# Patient Record
Sex: Male | Born: 1985 | Race: White | Hispanic: No | Marital: Married | Smoking: Never smoker
Health system: Southern US, Community
[De-identification: ages and names within clinical notes are randomized; demographics above are authoritative.]

## PROBLEM LIST (undated history)

## (undated) DIAGNOSIS — F32A Depression, unspecified: Secondary | ICD-10-CM

## (undated) DIAGNOSIS — E039 Hypothyroidism, unspecified: Secondary | ICD-10-CM

## (undated) DIAGNOSIS — F419 Anxiety disorder, unspecified: Secondary | ICD-10-CM

## (undated) DIAGNOSIS — G43909 Migraine, unspecified, not intractable, without status migrainosus: Secondary | ICD-10-CM

## (undated) DIAGNOSIS — F329 Major depressive disorder, single episode, unspecified: Secondary | ICD-10-CM

## (undated) DIAGNOSIS — N189 Chronic kidney disease, unspecified: Secondary | ICD-10-CM

## (undated) HISTORY — DX: Anxiety disorder, unspecified: F41.9

## (undated) HISTORY — PX: ROTATOR CUFF REPAIR: SHX139

## (undated) HISTORY — PX: TONSILLECTOMY: SUR1361

## (undated) HISTORY — PX: TYMPANOSTOMY TUBE PLACEMENT: SHX32

---
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
## 2009-10-05 ENCOUNTER — Emergency Department (HOSPITAL_COMMUNITY): Admission: EM | Admit: 2009-10-05 | Discharge: 2009-10-05 | Payer: Self-pay | Admitting: Emergency Medicine

## 2010-03-25 LAB — URINE MICROSCOPIC-ADD ON

## 2010-03-25 LAB — URINALYSIS, ROUTINE W REFLEX MICROSCOPIC
Bilirubin Urine: NEGATIVE
Glucose, UA: NEGATIVE mg/dL
Ketones, ur: NEGATIVE mg/dL
Leukocytes, UA: NEGATIVE
Nitrite: NEGATIVE
Protein, ur: NEGATIVE mg/dL
Specific Gravity, Urine: >1.03 — ABNORMAL HIGH (ref 1.005–1.030)
Urobilinogen, UA: 0.2 mg/dL (ref 0.0–1.0)
pH: 5.5 (ref 5.0–8.0)

## 2010-05-28 NOTE — Op Note (Signed)
[place]  Patient:    Chase Stewart, Chase Stewart                        MRN: [id number] Proc. Date: 02/18/99 Attending:  Margit Banda. Jearld Fenton, M.D. CC:         Dr. Jodelle Green                           Operative Report  PREOPERATIVE DIAGNOSIS:  Chronic tonsillitis.  POSTOPERATIVE DIAGNOSIS:  Chronic tonsillitis.  SURGICAL PROCEDURE:  Tonsillectomy.  SURGEON:  Margit Banda. Jearld Fenton, M.D.  ANESTHESIA:  General endotracheal tube.  ESTIMATED BLOOD LOSS:  Less than 5 cc.  INDICATIONS:  This is an 25 year old who has had frequent episodes of tonsillitis and refractory to medical therapy.  Broad-spectrum antibiotics have failed to resolve the recurrent infections.  The parents are informed of the risks and benefits of the procedure including bleeding, infection, velopharyngeal insufficiency, change in the voice and risks of the anesthesia.  All questions ere answered and consent was obtained.  DESCRIPTION OF PROCEDURE:  Patient was taken to the operating room and placed in supine position.  After adequate general endotracheal tube anesthesia, was placed in the Rose position and draped in the usual sterile manner.  The Crowe-Davis mouthgag was inserted, retracted and suspended from the Mayo stand.  The palate was checked; there was no submucous cleft.  The left tonsil was begun, making a left anterior tonsillar pillar incision, identifying the capsule of the tonsil and removing it with electrocautery dissection; right tonsil removed in the same fashion.  There did not seem to be any significant adenoid tissue.  Hemostasis as achieved with the suction cautery.  The Crowe-Davis was released and resuspended and there was hemostasis present in all locations.  The hypopharynx, esophagus nd stomach were suctioned with the NG tube.  The patient was awakened and brought o recovery in stable condition.  Counts:  Correct. DD:  03/24/99 TD:  03/24/99 Job: [id number]

## 2012-08-26 ENCOUNTER — Emergency Department: Payer: Self-pay | Admitting: Emergency Medicine

## 2012-09-26 ENCOUNTER — Emergency Department: Payer: Self-pay | Admitting: Emergency Medicine

## 2013-06-27 ENCOUNTER — Other Ambulatory Visit: Payer: Self-pay | Admitting: Endodontics

## 2014-10-07 ENCOUNTER — Ambulatory Visit
Admission: RE | Admit: 2014-10-07 | Discharge: 2014-10-07 | Disposition: A | Discharge status: Home / Self Care | Payer: BLUE CROSS/BLUE SHIELD | Source: Ambulatory Visit | Attending: Physician Assistant | Admitting: Physician Assistant

## 2014-10-07 ENCOUNTER — Other Ambulatory Visit: Payer: Self-pay | Admitting: Physician Assistant

## 2014-10-07 DIAGNOSIS — M7542 Impingement syndrome of left shoulder: Secondary | ICD-10-CM

## 2014-11-26 ENCOUNTER — Other Ambulatory Visit: Payer: Self-pay | Admitting: Physician Assistant

## 2014-11-26 DIAGNOSIS — M7542 Impingement syndrome of left shoulder: Secondary | ICD-10-CM

## 2014-12-16 ENCOUNTER — Ambulatory Visit
Admission: RE | Admit: 2014-12-16 | Discharge: 2014-12-16 | Disposition: A | Discharge status: Home / Self Care | Payer: BLUE CROSS/BLUE SHIELD | Source: Ambulatory Visit | Attending: Physician Assistant | Admitting: Physician Assistant

## 2014-12-16 DIAGNOSIS — M67912 Unspecified disorder of synovium and tendon, left shoulder: Secondary | ICD-10-CM | POA: Diagnosis not present

## 2014-12-16 DIAGNOSIS — M25512 Pain in left shoulder: Secondary | ICD-10-CM | POA: Diagnosis present

## 2014-12-16 DIAGNOSIS — M19012 Primary osteoarthritis, left shoulder: Secondary | ICD-10-CM | POA: Insufficient documentation

## 2014-12-16 DIAGNOSIS — M7542 Impingement syndrome of left shoulder: Secondary | ICD-10-CM | POA: Insufficient documentation

## 2015-05-13 ENCOUNTER — Other Ambulatory Visit: Payer: Self-pay | Admitting: Orthopedic Surgery

## 2015-05-20 ENCOUNTER — Encounter: Payer: Self-pay | Admitting: *Deleted

## 2015-05-20 ENCOUNTER — Other Ambulatory Visit: Payer: BLUE CROSS/BLUE SHIELD

## 2015-05-20 NOTE — Patient Instructions (Signed)
  Your procedure is scheduled on: 05-26-15 (TUESDAY) Report to [facility] 2ND FLOOR To find out your arrival time please call [phone] between 1PM - 3PM on 05-25-15 [facility](MONDAY)  Remember: Instructions that are not followed completely may result in serious medical risk, up to and including death, or upon the discretion of your surgeon and anesthesiologist your surgery may need to be rescheduled.    _X___ 1. Do not eat food or drink liquids after midnight. No gum chewing or hard candies.     _X___ 2. No Alcohol for 24 hours before or after surgery.   ____ 3. Bring all medications with you on the day of surgery if instructed.    _X___ 4. Notify your doctor if there is any change in your medical condition     (cold, fever, infections).     Do not wear jewelry, make-up, hairpins, clips or nail polish.  Do not wear lotions, powders, or perfumes. You may wear deodorant.  Do not shave 48 hours prior to surgery. Men may shave face and neck.  Do not bring valuables to the hospital.    [facility] is not responsible for any belongings or valuables.               Contacts, dentures or bridgework may not be worn into surgery.  Leave your suitcase in the car. After surgery it may be brought to your room.  For patients admitted to the hospital, discharge time is determined by your treatment team.   Patients discharged the day of surgery will not be allowed to drive home.   Please read over the following fact sheets that you were given:     _X___ Take these medicines the morning of surgery with A SIP OF WATER:    1. ZOLOFT  2.   3.   4.  5.  6.  ____ Fleet Enema (as directed)   _X___ Use CHG Soap as directed  ____ Use inhalers on the day of surgery  ____ Stop metformin 2 days prior to surgery    ____ Take 1/2 of usual insulin dose the night before surgery and none on the morning of surgery.   ____ Stop Coumadin/Plavix/aspirin-N/A  _X___ Stop  Anti-inflammatories-NO NSAIDS OR ASPIRIN PRODUCTS-TYLENOL OK TO TAKE   ____ Stop supplements until after surgery.    ____ Bring C-Pap to the hospital.

## 2015-05-21 ENCOUNTER — Encounter
Admission: RE | Admit: 2015-05-21 | Discharge: 2015-05-21 | Disposition: A | Discharge status: Home / Self Care | Payer: BLUE CROSS/BLUE SHIELD | Source: Ambulatory Visit | Attending: Orthopedic Surgery | Admitting: Orthopedic Surgery

## 2015-05-21 DIAGNOSIS — Z01812 Encounter for preprocedural laboratory examination: Secondary | ICD-10-CM | POA: Diagnosis present

## 2015-05-21 LAB — CBC WITH DIFFERENTIAL/PLATELET
Basophils Absolute: 0 10*3/uL (ref 0–0.1)
Basophils Relative: 0 %
Eosinophils Absolute: 0 10*3/uL (ref 0–0.7)
Eosinophils Relative: 1 %
HCT: 44.2 % (ref 40.0–52.0)
Hemoglobin: 15.2 g/dL (ref 13.0–18.0)
Lymphocytes Relative: 29 %
Lymphs Abs: 2.3 10*3/uL (ref 1.0–3.6)
MCH: 31.5 pg (ref 26.0–34.0)
MCHC: 34.3 g/dL (ref 32.0–36.0)
MCV: 91.8 fL (ref 80.0–100.0)
Monocytes Absolute: 0.5 10*3/uL (ref 0.2–1.0)
Monocytes Relative: 7 %
Neutro Abs: 4.9 10*3/uL (ref 1.4–6.5)
Neutrophils Relative %: 63 %
Platelets: 173 10*3/uL (ref 150–440)
RBC: 4.81 MIL/uL (ref 4.40–5.90)
RDW: 12.2 % (ref 11.5–14.5)
WBC: 7.7 10*3/uL (ref 3.8–10.6)

## 2015-05-21 LAB — BASIC METABOLIC PANEL
Anion gap: 8 (ref 5–15)
BUN: 13 mg/dL (ref 6–20)
CO2: 28 mmol/L (ref 22–32)
Calcium: 9.4 mg/dL (ref 8.9–10.3)
Chloride: 103 mmol/L (ref 101–111)
Creatinine, Ser: 1.08 mg/dL (ref 0.61–1.24)
GFR calc Af Amer: >60 mL/min (ref >60)
GFR calc non Af Amer: >60 mL/min (ref >60)
Glucose, Bld: 66 mg/dL (ref 65–99)
Potassium: 4.1 mmol/L (ref 3.5–5.1)
Sodium: 139 mmol/L (ref 135–145)

## 2015-05-21 LAB — PROTIME-INR
INR: 1.04
Prothrombin Time: 13.8 seconds (ref 11.4–15.0)

## 2015-05-21 LAB — APTT: aPTT: 28 seconds (ref 24–36)

## 2015-05-26 ENCOUNTER — Ambulatory Visit
Admission: RE | Admit: 2015-05-26 | Discharge: 2015-05-26 | Disposition: A | Discharge status: Home / Self Care | Payer: BLUE CROSS/BLUE SHIELD | Source: Ambulatory Visit | Attending: Orthopedic Surgery | Admitting: Orthopedic Surgery

## 2015-05-26 ENCOUNTER — Encounter: Payer: Self-pay | Admitting: Anesthesiology

## 2015-05-26 ENCOUNTER — Ambulatory Visit: Payer: BLUE CROSS/BLUE SHIELD | Admitting: Anesthesiology

## 2015-05-26 ENCOUNTER — Encounter
Admission: RE | Disposition: A | Discharge status: Home / Self Care | Payer: Self-pay | Source: Ambulatory Visit | Attending: Orthopedic Surgery

## 2015-05-26 DIAGNOSIS — Y9389 Activity, other specified: Secondary | ICD-10-CM | POA: Diagnosis not present

## 2015-05-26 DIAGNOSIS — S43432A Superior glenoid labrum lesion of left shoulder, initial encounter: Secondary | ICD-10-CM | POA: Insufficient documentation

## 2015-05-26 DIAGNOSIS — F329 Major depressive disorder, single episode, unspecified: Secondary | ICD-10-CM | POA: Insufficient documentation

## 2015-05-26 DIAGNOSIS — X58XXXA Exposure to other specified factors, initial encounter: Secondary | ICD-10-CM | POA: Diagnosis not present

## 2015-05-26 DIAGNOSIS — S43422A Sprain of left rotator cuff capsule, initial encounter: Secondary | ICD-10-CM | POA: Diagnosis not present

## 2015-05-26 HISTORY — DX: Chronic kidney disease, unspecified: N18.9

## 2015-05-26 HISTORY — DX: Major depressive disorder, single episode, unspecified: F32.9

## 2015-05-26 HISTORY — DX: Depression, unspecified: F32.A

## 2015-05-26 HISTORY — PX: SHOULDER ARTHROSCOPY WITH OPEN ROTATOR CUFF REPAIR AND DISTAL CLAVICLE ACROMINECTOMY: SHX5683

## 2015-05-26 SURGERY — SHOULDER ARTHROSCOPY WITH OPEN ROTATOR CUFF REPAIR AND DISTAL CLAVICLE ACROMINECTOMY
Anesthesia: General | Site: Shoulder | Laterality: Left | Wound class: Clean

## 2015-05-26 MED ORDER — ROCURONIUM BROMIDE 100 MG/10ML IV SOLN
INTRAVENOUS | Status: DC | PRN
  Administered 2015-05-26: 40 mg via INTRAVENOUS
  Administered 2015-05-26 (×3): 10 mg via INTRAVENOUS
  Administered 2015-05-26: 5 mg via INTRAVENOUS
  Administered 2015-05-26: 10 mg via INTRAVENOUS

## 2015-05-26 MED ORDER — EPINEPHRINE HCL 1 MG/ML IJ SOLN: INTRAMUSCULAR | Status: DC | PRN

## 2015-05-26 MED ORDER — LIDOCAINE HCL (CARDIAC) 20 MG/ML IV SOLN
INTRAVENOUS | Status: DC | PRN
  Administered 2015-05-26: 20 mg via INTRAVENOUS

## 2015-05-26 MED ORDER — CEFAZOLIN SODIUM-DEXTROSE 2-4 GM/100ML-% IV SOLN: 2.0000 g | INTRAVENOUS | Status: DC

## 2015-05-26 MED ORDER — ONDANSETRON HCL 4 MG PO TABS: 4.0000 mg | ORAL_TABLET | Freq: Three times a day (TID) | ORAL | Status: DC | PRN

## 2015-05-26 MED ORDER — FAMOTIDINE 20 MG PO TABS
ORAL_TABLET | ORAL | Status: AC
  Administered 2015-05-26: 20 mg via ORAL
  Filled 2015-05-26: qty 1

## 2015-05-26 MED ORDER — FAMOTIDINE 20 MG PO TABS
20.0000 mg | ORAL_TABLET | Freq: Once | ORAL | Status: AC
  Administered 2015-05-26: 20 mg via ORAL

## 2015-05-26 MED ORDER — CHLORHEXIDINE GLUCONATE 4 % EX LIQD
1.0000 | Freq: Once | CUTANEOUS | Status: DC
Start: 2015-05-26 — End: 2015-05-26

## 2015-05-26 MED ORDER — NEOSTIGMINE METHYLSULFATE 10 MG/10ML IV SOLN
INTRAVENOUS | Status: DC | PRN
  Administered 2015-05-26: 4 mg via INTRAVENOUS

## 2015-05-26 MED ORDER — LACTATED RINGERS IV SOLN
INTRAVENOUS | Status: DC | PRN
  Administered 2015-05-26: 11:00:00

## 2015-05-26 MED ORDER — FENTANYL CITRATE (PF) 100 MCG/2ML IJ SOLN
INTRAMUSCULAR | Status: AC
  Administered 2015-05-26: 50 ug via INTRAVENOUS
  Filled 2015-05-26: qty 2

## 2015-05-26 MED ORDER — FENTANYL CITRATE (PF) 100 MCG/2ML IJ SOLN
INTRAMUSCULAR | Status: DC | PRN
  Administered 2015-05-26: 100 ug via INTRAVENOUS

## 2015-05-26 MED ORDER — OXYCODONE HCL 5 MG PO TABS: 5.0000 mg | ORAL_TABLET | ORAL | Status: DC | PRN

## 2015-05-26 MED ORDER — CHLORHEXIDINE GLUCONATE 4 % EX LIQD: 1.0000 "application " | Freq: Once | CUTANEOUS | Status: DC

## 2015-05-26 MED ORDER — LIDOCAINE HCL (PF) 1 % IJ SOLN
INTRAMUSCULAR | Status: AC
  Filled 2015-05-26: qty 30

## 2015-05-26 MED ORDER — MIDAZOLAM HCL 2 MG/2ML IJ SOLN
1.0000 mg | Freq: Once | INTRAMUSCULAR | Status: AC
  Administered 2015-05-26: 1 mg via INTRAVENOUS

## 2015-05-26 MED ORDER — ONDANSETRON HCL 4 MG/2ML IJ SOLN
INTRAMUSCULAR | Status: DC | PRN
  Administered 2015-05-26: 4 mg via INTRAVENOUS

## 2015-05-26 MED ORDER — NEOMYCIN-POLYMYXIN B GU 40-200000 IR SOLN
Status: DC | PRN
  Administered 2015-05-26: 2 mL

## 2015-05-26 MED ORDER — BUPIVACAINE HCL (PF) 0.25 % IJ SOLN
INTRAMUSCULAR | Status: AC
  Filled 2015-05-26: qty 30

## 2015-05-26 MED ORDER — MIDAZOLAM HCL 5 MG/5ML IJ SOLN
INTRAMUSCULAR | Status: AC
  Administered 2015-05-26: 1 mg via INTRAVENOUS
  Filled 2015-05-26: qty 5

## 2015-05-26 MED ORDER — CEFAZOLIN SODIUM-DEXTROSE 2-4 GM/100ML-% IV SOLN
INTRAVENOUS | Status: AC
  Administered 2015-05-26: 2 g via INTRAVENOUS
  Filled 2015-05-26: qty 100

## 2015-05-26 MED ORDER — PHENYLEPHRINE HCL 10 MG/ML IJ SOLN
INTRAMUSCULAR | Status: DC | PRN
  Administered 2015-05-26 (×8): 100 ug via INTRAVENOUS

## 2015-05-26 MED ORDER — GLYCOPYRROLATE 0.2 MG/ML IJ SOLN
INTRAMUSCULAR | Status: DC | PRN
  Administered 2015-05-26: 0.6 mg via INTRAVENOUS

## 2015-05-26 MED ORDER — OXYCODONE HCL 5 MG PO TABS
ORAL_TABLET | ORAL | Status: AC
  Filled 2015-05-26: qty 2

## 2015-05-26 MED ORDER — MIDAZOLAM HCL 2 MG/2ML IJ SOLN
INTRAMUSCULAR | Status: DC | PRN
  Administered 2015-05-26: 2 mg via INTRAVENOUS

## 2015-05-26 MED ORDER — NEOMYCIN-POLYMYXIN B GU 40-200000 IR SOLN
Status: AC
  Filled 2015-05-26: qty 2

## 2015-05-26 MED ORDER — FENTANYL CITRATE (PF) 100 MCG/2ML IJ SOLN
50.0000 ug | Freq: Once | INTRAMUSCULAR | Status: AC
  Administered 2015-05-26: 50 ug via INTRAVENOUS
  Filled 2015-05-26: qty 1

## 2015-05-26 MED ORDER — PROPOFOL 10 MG/ML IV BOLUS
INTRAVENOUS | Status: DC | PRN
  Administered 2015-05-26: 150 mg via INTRAVENOUS

## 2015-05-26 MED ORDER — EPINEPHRINE HCL 1 MG/ML IJ SOLN
INTRAMUSCULAR | Status: AC
  Filled 2015-05-26: qty 1

## 2015-05-26 MED ORDER — EPHEDRINE SULFATE 50 MG/ML IJ SOLN
INTRAMUSCULAR | Status: DC | PRN
  Administered 2015-05-26: 10 mg via INTRAVENOUS

## 2015-05-26 MED ORDER — OXYCODONE HCL 5 MG PO TABS
10.0000 mg | ORAL_TABLET | ORAL | Status: AC
  Administered 2015-05-26: 10 mg via ORAL

## 2015-05-26 MED ORDER — LACTATED RINGERS IV SOLN
INTRAVENOUS | Status: DC
  Administered 2015-05-26: 08:00:00 via INTRAVENOUS

## 2015-05-26 MED ORDER — BUPIVACAINE HCL 0.25 % IJ SOLN
INTRAMUSCULAR | Status: DC | PRN
  Administered 2015-05-26: 30 mL

## 2015-05-26 MED ORDER — LIDOCAINE HCL (PF) 1 % IJ SOLN
INTRAMUSCULAR | Status: DC | PRN
  Administered 2015-05-26: 10 mL

## 2015-05-26 SURGICAL SUPPLY — 66 items
ADAPTER IRRIG TUBE 2 SPIKE SOL (ADAPTER) ×4 IMPLANT
ADPR TBG 2 SPK PMP STRL ASCP (ADAPTER) ×2
ANCH SUT 2 BSUT TK 12.4 PEEK (SUTURE)
ANCH SUT 2 SUTTK 14.5X3 (SUTURE) ×3
ANCHOR SUT BIOC ST 3X145 (SUTURE) ×3 IMPLANT
ANCHOR SUT PEEK 3.0 ST 3 (SUTURE) ×1 IMPLANT
BUR RADIUS 4.0X18.5 (BURR) ×2 IMPLANT
BUR RADIUS 5.5 (BURR) ×2 IMPLANT
CANNULA 5.75X7 CRYSTAL CLEAR (CANNULA) ×3 IMPLANT
CANNULA PARTIAL THREAD 2X7 (CANNULA) ×3 IMPLANT
CANNULA TWIST IN 8.25X9CM (CANNULA) ×2 IMPLANT
CONNECTOR PERFECT PASSER (CONNECTOR) ×4 IMPLANT
COOLER POLAR GLACIER W/PUMP (MISCELLANEOUS) ×2 IMPLANT
CRADLE LAMINECT ARM (MISCELLANEOUS) ×4 IMPLANT
DRAPE IMP U-DRAPE 54X76 (DRAPES) ×4 IMPLANT
DRAPE INCISE IOBAN 66X45 STRL (DRAPES) ×2 IMPLANT
DRAPE SHEET LG 3/4 BI-LAMINATE (DRAPES) ×2 IMPLANT
DRAPE U-SHAPE 47X51 STRL (DRAPES) ×2 IMPLANT
DURAPREP 26ML APPLICATOR (WOUND CARE) ×7 IMPLANT
ELECT REM PT RETURN 9FT ADLT (ELECTROSURGICAL) ×2
ELECTRODE REM PT RTRN 9FT ADLT (ELECTROSURGICAL) ×1 IMPLANT
GAUZE PETRO XEROFOAM 1X8 (MISCELLANEOUS) ×2 IMPLANT
GAUZE SPONGE 4X4 12PLY STRL (GAUZE/BANDAGES/DRESSINGS) ×2 IMPLANT
GLOVE BIOGEL PI IND STRL 9 (GLOVE) ×1 IMPLANT
GLOVE BIOGEL PI INDICATOR 9 (GLOVE) ×2
GLOVE SURG 9.0 ORTHO LTXF (GLOVE) ×7 IMPLANT
GOWN STRL REUS TWL 2XL XL LVL4 (GOWN DISPOSABLE) ×2 IMPLANT
GOWN STRL REUS W/ TWL LRG LVL3 (GOWN DISPOSABLE) ×1 IMPLANT
GOWN STRL REUS W/TWL LRG LVL3 (GOWN DISPOSABLE) ×2
IV LACTATED RINGER IRRG 3000ML (IV SOLUTION) ×16
IV LR IRRIG 3000ML ARTHROMATIC (IV SOLUTION) ×8 IMPLANT
KIT RM TURNOVER STRD PROC AR (KITS) ×2 IMPLANT
KIT STABILIZATION SHOULDER (MISCELLANEOUS) ×2 IMPLANT
KIT SUTURETAK 3.0 INSERT PERC (KITS) ×1 IMPLANT
MANIFOLD NEPTUNE II (INSTRUMENTS) ×2 IMPLANT
MASK FACE SPIDER DISP (MASK) ×2 IMPLANT
MAT BLUE FLOOR 46X72 FLO (MISCELLANEOUS) ×5 IMPLANT
NDL SAFETY 18GX1.5 (NEEDLE) ×2 IMPLANT
NDL SAFETY 22GX1.5 (NEEDLE) ×2 IMPLANT
NS IRRIG 500ML POUR BTL (IV SOLUTION) ×2 IMPLANT
PACK ARTHROSCOPY SHOULDER (MISCELLANEOUS) ×2 IMPLANT
PAD WRAPON POLAR SHDR UNIV (MISCELLANEOUS) ×1 IMPLANT
PASSER SUT CAPTURE FIRST (SUTURE) ×1 IMPLANT
SET TUBE SUCT SHAVER OUTFL 24K (TUBING) ×2 IMPLANT
SET TUBE TIP INTRA-ARTICULAR (MISCELLANEOUS) ×2 IMPLANT
STRIP CLOSURE SKIN 1/2X4 (GAUZE/BANDAGES/DRESSINGS) ×2 IMPLANT
SUT ETHILON 4-0 (SUTURE)
SUT ETHILON 4-0 FS2 18XMFL BLK (SUTURE)
SUT LASSO 90 DEG SD STR (SUTURE) ×1 IMPLANT
SUT MNCRL 4-0 (SUTURE)
SUT MNCRL 4-0 27XMFL (SUTURE)
SUT PDS AB 0 CT1 27 (SUTURE) ×3 IMPLANT
SUT PERFECTPASSER WHITE CART (SUTURE) ×4 IMPLANT
SUT SMART STITCH CARTRIDGE (SUTURE) ×4 IMPLANT
SUT VIC AB 0 CT1 36 (SUTURE) ×3 IMPLANT
SUT VIC AB 2-0 CT2 27 (SUTURE) ×1 IMPLANT
SUTURE ETHLN 4-0 FS2 18XMF BLK (SUTURE) ×1 IMPLANT
SUTURE MAGNUM WIRE 2X48 BLK (SUTURE) ×4 IMPLANT
SUTURE MNCRL 4-0 27XMF (SUTURE) ×1 IMPLANT
SYRINGE 10CC LL (SYRINGE) ×2 IMPLANT
Suture Anchor, Biocomposite (Anchor) ×4 IMPLANT
TAPE MICROFOAM 4IN (TAPE) ×2 IMPLANT
TUBING ARTHRO INFLOW-ONLY STRL (TUBING) ×2 IMPLANT
TUBING CONNECTING 10 (TUBING) ×1 IMPLANT
WAND HAND CNTRL MULTIVAC 90 (MISCELLANEOUS) ×2 IMPLANT
WRAPON POLAR PAD SHDR UNIV (MISCELLANEOUS) ×2

## 2015-05-26 NOTE — Anesthesia Postprocedure Evaluation (Signed)
Anesthesia Post Note  Patient: Chase Stewart  Procedure(s) Performed: Procedure(s) (LRB): SHOULDER ARTHROSCOPY WITH ANTERIOR AND SUPERIOR LABRAL REPAIR (Left)  Patient location during evaluation: PACU Anesthesia Type: General Level of consciousness: awake and alert Pain management: pain level controlled Vital Signs Assessment: post-procedure vital signs reviewed and stable Respiratory status: spontaneous breathing, nonlabored ventilation, respiratory function stable and patient connected to nasal cannula oxygen Cardiovascular status: blood pressure returned to baseline and stable Postop Assessment: no signs of nausea or vomiting Anesthetic complications: no    Last Vitals:  Filed Vitals:   05/26/15 1324 05/26/15 1351  BP: 118/64   Pulse:  67  Temp:    Resp:  16    Last Pain:  Filed Vitals:   05/26/15 1352  PainSc: 0-No pain                 Daquisha Clermont S

## 2015-05-26 NOTE — H&P (Signed)
The patient has been re-examined, and the chart reviewed, and there have been no interval changes to the documented history and physical.    The risks, benefits, and alternatives have been discussed at length, and the patient is willing to proceed.   

## 2015-05-26 NOTE — Anesthesia Preprocedure Evaluation (Signed)
Anesthesia Evaluation  Patient identified by MRN, date of birth, ID band Patient awake    Reviewed: Allergy & Precautions, NPO status , Patient's Chart, lab work & pertinent test results, reviewed documented beta blocker date and time   Airway Mallampati: II  TM Distance: >3 FB     Dental  (+) Chipped   Pulmonary           Cardiovascular      Neuro/Psych PSYCHIATRIC DISORDERS Depression    GI/Hepatic   Endo/Other    Renal/GU Renal InsufficiencyRenal disease     Musculoskeletal   Abdominal   Peds  Hematology   Anesthesia Other Findings   Reproductive/Obstetrics                             Anesthesia Physical Anesthesia Plan  ASA: II  Anesthesia Plan: General   Post-op Pain Management:    Induction: Intravenous  Airway Management Planned: Oral ETT  Additional Equipment:   Intra-op Plan:   Post-operative Plan:   Informed Consent: I have reviewed the patients History and Physical, chart, labs and discussed the procedure including the risks, benefits and alternatives for the proposed anesthesia with the patient or authorized representative who has indicated his/her understanding and acceptance.     Plan Discussed with: CRNA  Anesthesia Plan Comments:         Anesthesia Quick Evaluation

## 2015-05-26 NOTE — Anesthesia Procedure Notes (Addendum)
Anesthesia Regional Block:  Interscalene brachial plexus block  Pre-Anesthetic Checklist: ,, timeout performed, Correct Patient, Correct Site, Correct Laterality, Correct Procedure, Correct Position, site marked, Risks and benefits discussed,  Surgical consent,  Pre-op evaluation,  At surgeon's request and post-op pain management   Prep: Betadine       Needles:  Injection technique: Single-shot  Needle Type: Echogenic Stimulator Needle     Needle Length: 5cm 5 cm Needle Gauge: 21 and 21 G    Additional Needles:  Procedures: ultrasound guided (picture in chart) and nerve stimulator Interscalene brachial plexus block  Nerve Stimulator or Paresthesia:  Response: biceps flexion, 0.8 mA,   Additional Responses:   Narrative:  Injection made incrementally with aspirations every 5 mL.  Performed by: Personally  Anesthesiologist: Berdine AddisonHOMAS, MATHAI  Additional Notes: Functioning IV was confirmed and monitors were applied.  A 50mm 22ga Arrow echogenic stimulator needle was used. Sterile prep and drape,hand hygiene and sterile gloves were used.  Negative aspiration and negative test dose prior to incremental administration of local anesthetic. The patient tolerated the procedure well.  Ultrasound guidance: relevent anatomy identified, needle position confirmed, local anesthetic spread visualized around nerve(s), vascular puncture avoided.  Image printed for medical record. Marcaine 0.25% 20 ml with 0.1mg  of epi.   Procedure Name: Intubation Performed by: Casey BurkittHOANG, Westlee Devita Pre-anesthesia Checklist: Patient identified, Emergency Drugs available, Suction available, Patient being monitored and Timeout performed Patient Re-evaluated:Patient Re-evaluated prior to inductionOxygen Delivery Method: Circle system utilized Preoxygenation: Pre-oxygenation with 100% oxygen Intubation Type: IV induction Ventilation: Mask ventilation without difficulty Laryngoscope Size: Mac and 3 Grade View: Grade  II Tube type: Oral Tube size: 7.5 mm Number of attempts: 1 Airway Equipment and Method: Stylet Placement Confirmation: ETT inserted through vocal cords under direct vision,  positive ETCO2 and breath sounds checked- equal and bilateral Secured at: 23 cm Tube secured with: Tape Dental Injury: Teeth and Oropharynx as per pre-operative assessment  Difficulty Due To: Difficult Airway- due to anterior larynx

## 2015-05-26 NOTE — Progress Notes (Signed)
C/o feeling some nausea, Dr. Maisie Fushomas notified and ordered phenergan. Pharmacy stated that phenergan was on back order and to try compazine. Dr. Maisie Fushomas notified of this and then the patient stated that he was feeling better and didn't want any medication for nausea.

## 2015-05-26 NOTE — Discharge Instructions (Signed)

## 2015-05-26 NOTE — Op Note (Signed)
05/26/2015  11:48 AM  PATIENT:  Chase Stewart  30 y.o. male  PRE-OPERATIVE DIAGNOSIS:  Left shoulder superior labral tear  POST-OPERATIVE DIAGNOSIS:  Left shoulder superior and posterior labral repair with anterior capsulorrhaphy  PROCEDURE:  Procedure(s): LEFT SHOULDER ARTHROSCOPY WITH SLAP  REPAIR AND ANTERIOR CAPSULORRHAPHY  SURGEON:  Surgeon(s) and Role:    * Thornton Park, MD - Primary  ANESTHESIA:   local, regional and general   PREOPERATIVE INDICATIONS:  Chase Stewart is a  30 y.o. male results are with persistent left shoulder pain who failed conservative treatment including physical therapy. A superior labral tear was diagnosed by MR arthrogram  given the patient's failure to respond to nonoperative treatment, he elected for surgical management.    I discussed the risks and benefits of arthroscopic labral repair surgery. The risks include but are not limited to infection, bleeding, nerve or blood vessel injury, joint stiffness or loss of motion, persistent pain, weakness or instability, and hardware failure and the need for further surgery. Medical risks include but are not limited to DVT and pulmonary embolism, myocardial infarction, stroke, pneumonia, respiratory failure and death. Patient understood these risks and wished to proceed.    OPERATIVE IMPLANTS: Arthrex bio suturetak anchors 4   OPERATIVE FINDINGS: Left shoulder Buford complex with large SLAP tear and anterior shoulder instability.    OPERATIVE PROCEDURE:  I met with the patient in the preoperative area.  I signed the left shoulder according the hospital's correct site of surgery protocol. Patient underwent an interscalene block in the preoperative area by the anesthesia service. Patient was brought to the operating room where he was positioned supine on the operative table. He underwent general endotracheal intubation. He was then positioned in a beachchair position. All bony prominences were adequately  padded including the lower extremities.  Examination under anesthesia revealed anterior shoulder instability with subluxation anteriorly with load and shift testing.  He had a negative sulcus sign. Patient had no limitation shoulder range of motion.   The patient was then prepped and draped in a sterile fashion. He received 2 g of Ancef prior to the onset of the case.  A timeout was performed to verify the patient's name, date of birth, medical record number, correct site of surgery and correct procedure to be performed. Was also used to verify the patient received antibiotics that all appropriate instruments, implants and radiographic studies were available in the room. Once all in attendance were in agreement case began.  Bony landmarks were drawn out with a surgical marker along with proposed incisions. These were pre-injected with 1% lidocaine plain. An 11 blade was used to establish a posterior portal through which the arthroscope was placed in the glenohumeral joint. An anterior portal was established under direct visualization using an 18-gauge spinal needle for localization. A 5.75 mm arthroscopic cannula was inserted through the anterior portal. A full diagnostic examination of the glenohumeral joint was performed.  Findings on arthroscopy included a Buford complex, fraying of the biceps sling, a large SLAP tear extending from anterior to posterior to approximately the 10:00 position. The humeral head could be subluxed manually anteriorly of approximately 3-4 quadrants. Patient was found to have an enlarged superior labrum  An anterolateral portal was established again under direct visualization using an 18-gauge spinal needle. A 7 mm cannula was placed through this anterolateral portal.    A 4.0 resector shaver blade was then used to debride the frayed edges of the biceps sling. There were adhesions to the  anterior superior labrum which were also debrided with the resector.  The superior labrum  was debrided with left 90 basket and 40 resector shaver blade to reduce its size. The superior, non-articular portion of the glenoid was debrided with a 4.20m resector shaver blade in preparation for repair.  Punctate bleeding was identified which will facilitate labral healing. An Arthrex bio suturetak anchor was then placed in the anterior superior glenoid just anterior to the biceps anchor. A single limb of this anchor was brought under the labrum. An arthroscopic knot tying technique was used to tie down the anterior superior labrum.  A percutaneous drill guide was then inserted laterally using the Arthrex percutaneous kit. An additional superior anchor was placed just posterior to the biceps anchor. A single limb was shuttled under the superior labrum using a 90 suture lasso. This was also tied down using an arthroscopic knot tying technique.  The arthroscope was then placed through the anterior portal using switching sticks. A 7.0 mm cannula was placed through the posterior portal. Using the percutaneous drill guide a second superior posterior anchor was placed. Again the 90 suture lasso was used to pass a single limb of this anchor under the labrum from the posterior portal. The sutures from this anchor were then tied down using arthroscopic knot tying technique. A hook probe was used to confirm stability of the superior labral repair.  Switching sticks were again used to reposition the arthroscope to the posterior portal. Patient still had the ability to sublux thehumeral head approximately 2 quadrants. No anterior labral tear was identified. The decision was therefore made to perform an anterior capsulorrhaphy. A fourth bio suture tack anchor was placed in the anterior labrum at approximately the 7:00 position. Again the single limb of this anchor was passed below the labrum and capsule and tied down as an arthroscopic knot tying technique. This provided a soft tissue bumper and the anterior  inferior labrum.   Arthrex suture lasso was then used to shuttle a single limb of this anchor through the anterior inferior capsule.  Arthroscopic knot tying technique was then used to complete the capsulorrhaphy anteriorly. Following capsulorrhaphy the humeral head was stable. Final arthroscopic images were taken.  The glenohumeral joint was then copiously irrigated.  All arthroscopic instruments were removed. The 4 arthroscopic portals were closed with 4-0 nylon. A dry, sterile dressing was applied to the right shoulder, along with TENS unit leads and a Polar Care sleeve. Patient's right arm was then placed in an abduction sling. He was awoken and brought to PACU in stable condition. I was scrubbed and present for the entire case and all sharp and instrument counts were correct at the conclusion the case. I spoke with the patient's girlfriend in the postop consultation room to let her know the operation was successful and performed without complication.    KTimoteo Gaul MD

## 2015-05-26 NOTE — Transfer of Care (Signed)
Immediate Anesthesia Transfer of Care Note  Patient: Chase Stewart  Procedure(s) Performed: Procedure(s): SHOULDER ARTHROSCOPY WITH ANTERIOR AND SUPERIOR LABRAL REPAIR (Left)  Patient Location: PACU  Anesthesia Type:General  Level of Consciousness: responds to stimulation  Airway & Oxygen Therapy: Patient Spontanous Breathing and Patient connected to face mask oxygen  Post-op Assessment: Report given to RN and Post -op Vital signs reviewed and stable  Post vital signs: Reviewed and stable  Last Vitals:  Filed Vitals:   05/26/15 1148 05/26/15 1149  BP: 122/50 122/50  Pulse: 50 49  Temp: 36.1 C   Resp: 16 15    Last Pain: There were no vitals filed for this visit.       Complications: No apparent anesthesia complications

## 2015-05-27 ENCOUNTER — Encounter: Payer: Self-pay | Admitting: Orthopedic Surgery

## 2015-08-28 DIAGNOSIS — M25519 Pain in unspecified shoulder: Secondary | ICD-10-CM | POA: Insufficient documentation

## 2015-12-28 DIAGNOSIS — Z8619 Personal history of other infectious and parasitic diseases: Secondary | ICD-10-CM | POA: Insufficient documentation

## 2018-07-11 DIAGNOSIS — R51 Headache: Secondary | ICD-10-CM | POA: Diagnosis not present

## 2018-07-11 DIAGNOSIS — M9901 Segmental and somatic dysfunction of cervical region: Secondary | ICD-10-CM | POA: Diagnosis not present

## 2018-07-11 DIAGNOSIS — M5412 Radiculopathy, cervical region: Secondary | ICD-10-CM | POA: Diagnosis not present

## 2018-07-11 DIAGNOSIS — M9902 Segmental and somatic dysfunction of thoracic region: Secondary | ICD-10-CM | POA: Diagnosis not present

## 2018-07-12 DIAGNOSIS — R51 Headache: Secondary | ICD-10-CM | POA: Diagnosis not present

## 2018-07-12 DIAGNOSIS — M9901 Segmental and somatic dysfunction of cervical region: Secondary | ICD-10-CM | POA: Diagnosis not present

## 2018-07-12 DIAGNOSIS — M9902 Segmental and somatic dysfunction of thoracic region: Secondary | ICD-10-CM | POA: Diagnosis not present

## 2018-07-12 DIAGNOSIS — M5412 Radiculopathy, cervical region: Secondary | ICD-10-CM | POA: Diagnosis not present

## 2018-07-16 DIAGNOSIS — M9902 Segmental and somatic dysfunction of thoracic region: Secondary | ICD-10-CM | POA: Diagnosis not present

## 2018-07-16 DIAGNOSIS — R51 Headache: Secondary | ICD-10-CM | POA: Diagnosis not present

## 2018-07-16 DIAGNOSIS — M9901 Segmental and somatic dysfunction of cervical region: Secondary | ICD-10-CM | POA: Diagnosis not present

## 2018-07-16 DIAGNOSIS — M5412 Radiculopathy, cervical region: Secondary | ICD-10-CM | POA: Diagnosis not present

## 2018-07-18 DIAGNOSIS — M9902 Segmental and somatic dysfunction of thoracic region: Secondary | ICD-10-CM | POA: Diagnosis not present

## 2018-07-18 DIAGNOSIS — M5412 Radiculopathy, cervical region: Secondary | ICD-10-CM | POA: Diagnosis not present

## 2018-07-18 DIAGNOSIS — M9901 Segmental and somatic dysfunction of cervical region: Secondary | ICD-10-CM | POA: Diagnosis not present

## 2018-07-18 DIAGNOSIS — R51 Headache: Secondary | ICD-10-CM | POA: Diagnosis not present

## 2018-07-19 DIAGNOSIS — R51 Headache: Secondary | ICD-10-CM | POA: Diagnosis not present

## 2018-07-19 DIAGNOSIS — M5412 Radiculopathy, cervical region: Secondary | ICD-10-CM | POA: Diagnosis not present

## 2018-07-19 DIAGNOSIS — M9902 Segmental and somatic dysfunction of thoracic region: Secondary | ICD-10-CM | POA: Diagnosis not present

## 2018-07-19 DIAGNOSIS — M9901 Segmental and somatic dysfunction of cervical region: Secondary | ICD-10-CM | POA: Diagnosis not present

## 2018-07-23 DIAGNOSIS — M9902 Segmental and somatic dysfunction of thoracic region: Secondary | ICD-10-CM | POA: Diagnosis not present

## 2018-07-23 DIAGNOSIS — M9901 Segmental and somatic dysfunction of cervical region: Secondary | ICD-10-CM | POA: Diagnosis not present

## 2018-07-23 DIAGNOSIS — M5412 Radiculopathy, cervical region: Secondary | ICD-10-CM | POA: Diagnosis not present

## 2018-07-23 DIAGNOSIS — R51 Headache: Secondary | ICD-10-CM | POA: Diagnosis not present

## 2018-08-01 DIAGNOSIS — M9902 Segmental and somatic dysfunction of thoracic region: Secondary | ICD-10-CM | POA: Diagnosis not present

## 2018-08-01 DIAGNOSIS — M5412 Radiculopathy, cervical region: Secondary | ICD-10-CM | POA: Diagnosis not present

## 2018-08-01 DIAGNOSIS — R51 Headache: Secondary | ICD-10-CM | POA: Diagnosis not present

## 2018-08-01 DIAGNOSIS — M9901 Segmental and somatic dysfunction of cervical region: Secondary | ICD-10-CM | POA: Diagnosis not present

## 2018-08-02 DIAGNOSIS — R51 Headache: Secondary | ICD-10-CM | POA: Diagnosis not present

## 2018-08-02 DIAGNOSIS — M9901 Segmental and somatic dysfunction of cervical region: Secondary | ICD-10-CM | POA: Diagnosis not present

## 2018-08-02 DIAGNOSIS — M5412 Radiculopathy, cervical region: Secondary | ICD-10-CM | POA: Diagnosis not present

## 2018-08-02 DIAGNOSIS — M9902 Segmental and somatic dysfunction of thoracic region: Secondary | ICD-10-CM | POA: Diagnosis not present

## 2018-08-04 DIAGNOSIS — M5412 Radiculopathy, cervical region: Secondary | ICD-10-CM | POA: Diagnosis not present

## 2018-08-04 DIAGNOSIS — M9901 Segmental and somatic dysfunction of cervical region: Secondary | ICD-10-CM | POA: Diagnosis not present

## 2018-08-04 DIAGNOSIS — M9902 Segmental and somatic dysfunction of thoracic region: Secondary | ICD-10-CM | POA: Diagnosis not present

## 2018-08-04 DIAGNOSIS — R51 Headache: Secondary | ICD-10-CM | POA: Diagnosis not present

## 2018-08-07 DIAGNOSIS — M5412 Radiculopathy, cervical region: Secondary | ICD-10-CM | POA: Diagnosis not present

## 2018-08-07 DIAGNOSIS — M9901 Segmental and somatic dysfunction of cervical region: Secondary | ICD-10-CM | POA: Diagnosis not present

## 2018-08-07 DIAGNOSIS — R51 Headache: Secondary | ICD-10-CM | POA: Diagnosis not present

## 2018-08-07 DIAGNOSIS — M9902 Segmental and somatic dysfunction of thoracic region: Secondary | ICD-10-CM | POA: Diagnosis not present

## 2018-08-09 DIAGNOSIS — M9901 Segmental and somatic dysfunction of cervical region: Secondary | ICD-10-CM | POA: Diagnosis not present

## 2018-08-09 DIAGNOSIS — M5412 Radiculopathy, cervical region: Secondary | ICD-10-CM | POA: Diagnosis not present

## 2018-08-09 DIAGNOSIS — R51 Headache: Secondary | ICD-10-CM | POA: Diagnosis not present

## 2018-08-09 DIAGNOSIS — M9902 Segmental and somatic dysfunction of thoracic region: Secondary | ICD-10-CM | POA: Diagnosis not present

## 2018-08-14 DIAGNOSIS — R51 Headache: Secondary | ICD-10-CM | POA: Diagnosis not present

## 2018-08-14 DIAGNOSIS — M5412 Radiculopathy, cervical region: Secondary | ICD-10-CM | POA: Diagnosis not present

## 2018-08-14 DIAGNOSIS — M9901 Segmental and somatic dysfunction of cervical region: Secondary | ICD-10-CM | POA: Diagnosis not present

## 2018-08-14 DIAGNOSIS — M9902 Segmental and somatic dysfunction of thoracic region: Secondary | ICD-10-CM | POA: Diagnosis not present

## 2018-08-16 DIAGNOSIS — M5412 Radiculopathy, cervical region: Secondary | ICD-10-CM | POA: Diagnosis not present

## 2018-08-16 DIAGNOSIS — M9902 Segmental and somatic dysfunction of thoracic region: Secondary | ICD-10-CM | POA: Diagnosis not present

## 2018-08-16 DIAGNOSIS — M9901 Segmental and somatic dysfunction of cervical region: Secondary | ICD-10-CM | POA: Diagnosis not present

## 2018-08-16 DIAGNOSIS — R51 Headache: Secondary | ICD-10-CM | POA: Diagnosis not present

## 2018-08-18 DIAGNOSIS — M5412 Radiculopathy, cervical region: Secondary | ICD-10-CM | POA: Diagnosis not present

## 2018-08-18 DIAGNOSIS — R51 Headache: Secondary | ICD-10-CM | POA: Diagnosis not present

## 2018-08-18 DIAGNOSIS — M9902 Segmental and somatic dysfunction of thoracic region: Secondary | ICD-10-CM | POA: Diagnosis not present

## 2018-08-18 DIAGNOSIS — M9901 Segmental and somatic dysfunction of cervical region: Secondary | ICD-10-CM | POA: Diagnosis not present

## 2018-08-28 DIAGNOSIS — M5412 Radiculopathy, cervical region: Secondary | ICD-10-CM | POA: Diagnosis not present

## 2018-08-28 DIAGNOSIS — M9901 Segmental and somatic dysfunction of cervical region: Secondary | ICD-10-CM | POA: Diagnosis not present

## 2018-08-28 DIAGNOSIS — R51 Headache: Secondary | ICD-10-CM | POA: Diagnosis not present

## 2018-08-28 DIAGNOSIS — M9902 Segmental and somatic dysfunction of thoracic region: Secondary | ICD-10-CM | POA: Diagnosis not present

## 2018-08-30 DIAGNOSIS — M5412 Radiculopathy, cervical region: Secondary | ICD-10-CM | POA: Diagnosis not present

## 2018-08-30 DIAGNOSIS — R51 Headache: Secondary | ICD-10-CM | POA: Diagnosis not present

## 2018-08-30 DIAGNOSIS — M9901 Segmental and somatic dysfunction of cervical region: Secondary | ICD-10-CM | POA: Diagnosis not present

## 2018-08-30 DIAGNOSIS — M9902 Segmental and somatic dysfunction of thoracic region: Secondary | ICD-10-CM | POA: Diagnosis not present

## 2018-09-04 DIAGNOSIS — M9901 Segmental and somatic dysfunction of cervical region: Secondary | ICD-10-CM | POA: Diagnosis not present

## 2018-09-04 DIAGNOSIS — M9902 Segmental and somatic dysfunction of thoracic region: Secondary | ICD-10-CM | POA: Diagnosis not present

## 2018-09-04 DIAGNOSIS — M5412 Radiculopathy, cervical region: Secondary | ICD-10-CM | POA: Diagnosis not present

## 2018-09-04 DIAGNOSIS — R51 Headache: Secondary | ICD-10-CM | POA: Diagnosis not present

## 2018-09-06 DIAGNOSIS — M9901 Segmental and somatic dysfunction of cervical region: Secondary | ICD-10-CM | POA: Diagnosis not present

## 2018-09-06 DIAGNOSIS — M5412 Radiculopathy, cervical region: Secondary | ICD-10-CM | POA: Diagnosis not present

## 2018-09-06 DIAGNOSIS — R51 Headache: Secondary | ICD-10-CM | POA: Diagnosis not present

## 2018-09-06 DIAGNOSIS — M9902 Segmental and somatic dysfunction of thoracic region: Secondary | ICD-10-CM | POA: Diagnosis not present

## 2018-09-21 DIAGNOSIS — M9901 Segmental and somatic dysfunction of cervical region: Secondary | ICD-10-CM | POA: Diagnosis not present

## 2018-09-21 DIAGNOSIS — M5412 Radiculopathy, cervical region: Secondary | ICD-10-CM | POA: Diagnosis not present

## 2018-09-21 DIAGNOSIS — M9902 Segmental and somatic dysfunction of thoracic region: Secondary | ICD-10-CM | POA: Diagnosis not present

## 2018-09-21 DIAGNOSIS — R51 Headache: Secondary | ICD-10-CM | POA: Diagnosis not present

## 2018-10-12 DIAGNOSIS — M9901 Segmental and somatic dysfunction of cervical region: Secondary | ICD-10-CM | POA: Diagnosis not present

## 2018-10-12 DIAGNOSIS — M9902 Segmental and somatic dysfunction of thoracic region: Secondary | ICD-10-CM | POA: Diagnosis not present

## 2018-10-12 DIAGNOSIS — M5412 Radiculopathy, cervical region: Secondary | ICD-10-CM | POA: Diagnosis not present

## 2018-10-12 DIAGNOSIS — R519 Headache, unspecified: Secondary | ICD-10-CM | POA: Diagnosis not present

## 2018-10-16 DIAGNOSIS — M5412 Radiculopathy, cervical region: Secondary | ICD-10-CM | POA: Diagnosis not present

## 2018-10-16 DIAGNOSIS — M9902 Segmental and somatic dysfunction of thoracic region: Secondary | ICD-10-CM | POA: Diagnosis not present

## 2018-10-16 DIAGNOSIS — M9901 Segmental and somatic dysfunction of cervical region: Secondary | ICD-10-CM | POA: Diagnosis not present

## 2018-10-16 DIAGNOSIS — R519 Headache, unspecified: Secondary | ICD-10-CM | POA: Diagnosis not present

## 2018-10-23 DIAGNOSIS — M9902 Segmental and somatic dysfunction of thoracic region: Secondary | ICD-10-CM | POA: Diagnosis not present

## 2018-10-23 DIAGNOSIS — R519 Headache, unspecified: Secondary | ICD-10-CM | POA: Diagnosis not present

## 2018-10-23 DIAGNOSIS — M9901 Segmental and somatic dysfunction of cervical region: Secondary | ICD-10-CM | POA: Diagnosis not present

## 2018-10-23 DIAGNOSIS — M5412 Radiculopathy, cervical region: Secondary | ICD-10-CM | POA: Diagnosis not present

## 2018-10-30 DIAGNOSIS — M9902 Segmental and somatic dysfunction of thoracic region: Secondary | ICD-10-CM | POA: Diagnosis not present

## 2018-10-30 DIAGNOSIS — M9901 Segmental and somatic dysfunction of cervical region: Secondary | ICD-10-CM | POA: Diagnosis not present

## 2018-10-30 DIAGNOSIS — R519 Headache, unspecified: Secondary | ICD-10-CM | POA: Diagnosis not present

## 2018-10-30 DIAGNOSIS — M5412 Radiculopathy, cervical region: Secondary | ICD-10-CM | POA: Diagnosis not present

## 2018-11-06 DIAGNOSIS — M5412 Radiculopathy, cervical region: Secondary | ICD-10-CM | POA: Diagnosis not present

## 2018-11-06 DIAGNOSIS — R519 Headache, unspecified: Secondary | ICD-10-CM | POA: Diagnosis not present

## 2018-11-06 DIAGNOSIS — M9901 Segmental and somatic dysfunction of cervical region: Secondary | ICD-10-CM | POA: Diagnosis not present

## 2018-11-06 DIAGNOSIS — M9902 Segmental and somatic dysfunction of thoracic region: Secondary | ICD-10-CM | POA: Diagnosis not present

## 2018-11-20 DIAGNOSIS — M5412 Radiculopathy, cervical region: Secondary | ICD-10-CM | POA: Diagnosis not present

## 2018-11-20 DIAGNOSIS — M9902 Segmental and somatic dysfunction of thoracic region: Secondary | ICD-10-CM | POA: Diagnosis not present

## 2018-11-20 DIAGNOSIS — M9901 Segmental and somatic dysfunction of cervical region: Secondary | ICD-10-CM | POA: Diagnosis not present

## 2018-11-20 DIAGNOSIS — R519 Headache, unspecified: Secondary | ICD-10-CM | POA: Diagnosis not present

## 2018-11-23 DIAGNOSIS — Z20828 Contact with and (suspected) exposure to other viral communicable diseases: Secondary | ICD-10-CM | POA: Diagnosis not present

## 2019-02-05 DIAGNOSIS — M9902 Segmental and somatic dysfunction of thoracic region: Secondary | ICD-10-CM | POA: Diagnosis not present

## 2019-02-05 DIAGNOSIS — M9901 Segmental and somatic dysfunction of cervical region: Secondary | ICD-10-CM | POA: Diagnosis not present

## 2019-02-05 DIAGNOSIS — R519 Headache, unspecified: Secondary | ICD-10-CM | POA: Diagnosis not present

## 2019-02-05 DIAGNOSIS — M5412 Radiculopathy, cervical region: Secondary | ICD-10-CM | POA: Diagnosis not present

## 2019-02-25 DIAGNOSIS — R519 Headache, unspecified: Secondary | ICD-10-CM | POA: Diagnosis not present

## 2019-02-25 DIAGNOSIS — M9902 Segmental and somatic dysfunction of thoracic region: Secondary | ICD-10-CM | POA: Diagnosis not present

## 2019-02-25 DIAGNOSIS — M5412 Radiculopathy, cervical region: Secondary | ICD-10-CM | POA: Diagnosis not present

## 2019-02-25 DIAGNOSIS — M9901 Segmental and somatic dysfunction of cervical region: Secondary | ICD-10-CM | POA: Diagnosis not present

## 2019-05-09 DIAGNOSIS — M5412 Radiculopathy, cervical region: Secondary | ICD-10-CM | POA: Diagnosis not present

## 2019-05-09 DIAGNOSIS — R519 Headache, unspecified: Secondary | ICD-10-CM | POA: Diagnosis not present

## 2019-05-09 DIAGNOSIS — M9902 Segmental and somatic dysfunction of thoracic region: Secondary | ICD-10-CM | POA: Diagnosis not present

## 2019-05-09 DIAGNOSIS — M9901 Segmental and somatic dysfunction of cervical region: Secondary | ICD-10-CM | POA: Diagnosis not present

## 2019-05-22 NOTE — Progress Notes (Signed)
Scheduled to complete physical 05/30/19 with Dr. Williams.  AMD 

## 2019-05-23 ENCOUNTER — Ambulatory Visit: Payer: Self-pay

## 2019-05-23 ENCOUNTER — Other Ambulatory Visit: Payer: Self-pay

## 2019-05-23 DIAGNOSIS — Z0000 Encounter for general adult medical examination without abnormal findings: Secondary | ICD-10-CM

## 2019-05-23 LAB — POCT URINALYSIS DIPSTICK
Bilirubin, UA: NEGATIVE
Blood, UA: NEGATIVE
Glucose, UA: NEGATIVE
Ketones, UA: NEGATIVE
Leukocytes, UA: NEGATIVE
Nitrite, UA: NEGATIVE
Protein, UA: NEGATIVE
Spec Grav, UA: >=1.03 — AB (ref 1.010–1.025)
Urobilinogen, UA: 0.2 E.U./dL
pH, UA: 6 (ref 5.0–8.0)

## 2019-05-24 LAB — CMP12+LP+TP+TSH+6AC+CBC/D/PLT
ALT: 16 IU/L (ref 0–44)
AST: 26 IU/L (ref 0–40)
Albumin/Globulin Ratio: 1.4 (ref 1.2–2.2)
Albumin: 4.6 g/dL (ref 4.0–5.0)
Alkaline Phosphatase: 73 IU/L (ref 39–117)
BUN/Creatinine Ratio: 15 (ref 9–20)
BUN: 17 mg/dL (ref 6–20)
Basophils Absolute: 0 10*3/uL (ref 0.0–0.2)
Basos: 1 %
Bilirubin Total: 0.4 mg/dL (ref 0.0–1.2)
Calcium: 9.8 mg/dL (ref 8.7–10.2)
Chloride: 105 mmol/L (ref 96–106)
Chol/HDL Ratio: 3 ratio (ref 0.0–5.0)
Cholesterol, Total: 121 mg/dL (ref 100–199)
Creatinine, Ser: 1.11 mg/dL (ref 0.76–1.27)
EOS (ABSOLUTE): 0.1 10*3/uL (ref 0.0–0.4)
Eos: 1 %
Estimated CHD Risk: <0.5 times avg. (ref 0.0–1.0)
Free Thyroxine Index: 1.6 (ref 1.2–4.9)
GFR calc Af Amer: 101 mL/min/{1.73_m2} (ref >59)
GFR calc non Af Amer: 87 mL/min/{1.73_m2} (ref >59)
GGT: 11 IU/L (ref 0–65)
Globulin, Total: 3.3 g/dL (ref 1.5–4.5)
Glucose: 93 mg/dL (ref 65–99)
HDL: 41 mg/dL (ref >39)
Hematocrit: 44.6 % (ref 37.5–51.0)
Hemoglobin: 15.6 g/dL (ref 13.0–17.7)
Immature Grans (Abs): 0 10*3/uL (ref 0.0–0.1)
Immature Granulocytes: 1 %
Iron: 56 ug/dL (ref 38–169)
LDH: 143 IU/L (ref 121–224)
LDL Chol Calc (NIH): 66 mg/dL (ref 0–99)
Lymphocytes Absolute: 2.4 10*3/uL (ref 0.7–3.1)
Lymphs: 33 %
MCH: 32.7 pg (ref 26.6–33.0)
MCHC: 35 g/dL (ref 31.5–35.7)
MCV: 94 fL (ref 79–97)
Monocytes Absolute: 0.6 10*3/uL (ref 0.1–0.9)
Monocytes: 8 %
Neutrophils Absolute: 4 10*3/uL (ref 1.4–7.0)
Neutrophils: 56 %
Phosphorus: 3.5 mg/dL (ref 2.8–4.1)
Platelets: 201 10*3/uL (ref 150–450)
Potassium: 4.7 mmol/L (ref 3.5–5.2)
RBC: 4.77 x10E6/uL (ref 4.14–5.80)
RDW: 12.3 % (ref 11.6–15.4)
Sodium: 142 mmol/L (ref 134–144)
T3 Uptake Ratio: 25 % (ref 24–39)
T4, Total: 6.5 ug/dL (ref 4.5–12.0)
TSH: 3.5 u[IU]/mL (ref 0.450–4.500)
Total Protein: 7.9 g/dL (ref 6.0–8.5)
Triglycerides: 68 mg/dL (ref 0–149)
Uric Acid: 6.3 mg/dL (ref 3.8–8.4)
VLDL Cholesterol Cal: 14 mg/dL (ref 5–40)
WBC: 7.1 10*3/uL (ref 3.4–10.8)

## 2019-05-30 ENCOUNTER — Other Ambulatory Visit: Payer: Self-pay

## 2019-05-30 ENCOUNTER — Encounter: Payer: Self-pay | Admitting: Emergency Medicine

## 2019-05-30 ENCOUNTER — Ambulatory Visit: Payer: Self-pay | Admitting: Emergency Medicine

## 2019-05-30 VITALS — BP 108/71 | HR 57 | Temp 97.7°F | Resp 12 | Ht 70.0 in | Wt 152.0 lb

## 2019-05-30 DIAGNOSIS — Z0000 Encounter for general adult medical examination without abnormal findings: Secondary | ICD-10-CM

## 2019-05-30 NOTE — Progress Notes (Signed)
[facility] Provider Note       Time seen: 9:27 AM   I have reviewed the vital signs and the nursing notes.  HISTORY   Chief Complaint No chief complaint on file.    HPI Chase Stewart is a 34 y.o. male with a history of chronic kidney disease, depression who presents today for annual physical examination.  He has no complaints, has not been sick  Past Medical History:  Diagnosis Date  . Chronic kidney disease    h/o kidney stones  . Depression     Past Surgical History:  Procedure Laterality Date  . ROTATOR CUFF REPAIR Right   . SHOULDER ARTHROSCOPY WITH OPEN ROTATOR CUFF REPAIR AND DISTAL CLAVICLE ACROMINECTOMY Left 05/26/2015   Procedure: SHOULDER ARTHROSCOPY WITH ANTERIOR AND SUPERIOR LABRAL REPAIR;  Surgeon: Thornton Park, MD;  Location: [facility] ORS;  Service: Orthopedics;  Laterality: Left;  . TONSILLECTOMY    . TYMPANOSTOMY TUBE PLACEMENT      Allergies Patient has no known allergies.  Review of Systems Constitutional: Negative for fever. Cardiovascular: Negative for chest pain. Respiratory: Negative for shortness of breath. Gastrointestinal: Negative for abdominal pain, vomiting and diarrhea. Musculoskeletal: Negative for back pain. Skin: Negative for rash. Neurological: Negative for headaches, focal weakness or numbness.  All systems negative/normal/unremarkable except as stated in the HPI  ____________________________________________   PHYSICAL EXAM:  VITAL SIGNS: Vitals:   05/30/19 0929  BP: 108/71  Pulse: (!) 57  Resp: 12  Temp: 97.7 F (36.5 C)  SpO2: 100%    Constitutional: Alert and oriented. Well appearing and in no distress. Eyes: Conjunctivae are normal. Normal extraocular movements. ENT      Head: Normocephalic and atraumatic.      Nose: No congestion/rhinnorhea.      Mouth/Throat: Mucous membranes are moist.      Neck: No stridor. Cardiovascular: Normal rate, regular rhythm. No murmurs, rubs, or  gallops. Respiratory: Normal respiratory effort without tachypnea nor retractions. Breath sounds are clear and equal bilaterally. No wheezes/rales/rhonchi. Gastrointestinal: Soft and nontender. Normal bowel sounds Musculoskeletal: Nontender with normal range of motion in extremities. No lower extremity tenderness nor edema. Neurologic:  Normal speech and language. No gross focal neurologic deficits are appreciated.  Skin:  Skin is warm, dry and intact. No rash noted. Psychiatric: Speech and behavior are normal.  ____________________________________________   LABS (pertinent positives/negatives) Recent Results (from the past 2160 hour(s))  CMP12+LP+TP+TSH+6AC+CBC/D/Plt     Status: None   Collection Time: 05/23/19  9:10 AM  Result Value Ref Range   Glucose 93 65 - 99 mg/dL   Uric Acid 6.3 3.8 - 8.4 mg/dL    Comment:            Therapeutic target for gout patients: <6.0   BUN 17 6 - 20 mg/dL   Creatinine, Ser 1.11 0.76 - 1.27 mg/dL   GFR calc non Af Amer 87 >59 mL/min/1.73   GFR calc Af Amer 101 >59 mL/min/1.73    Comment: **[facility] currently reports eGFR in compliance with the current**   recommendations of the [employer]. [facility] will   update reporting as new guidelines are published from the NKF-ASN   Task force.    BUN/Creatinine Ratio 15 9 - 20   Sodium 142 134 - 144 mmol/L   Potassium 4.7 3.5 - 5.2 mmol/L   Chloride 105 96 - 106 mmol/L   Calcium 9.8 8.7 - 10.2 mg/dL   Phosphorus 3.5 2.8 - 4.1 mg/dL  Total Protein 7.9 6.0 - 8.5 g/dL   Albumin 4.6 4.0 - 5.0 g/dL   Globulin, Total 3.3 1.5 - 4.5 g/dL   Albumin/Globulin Ratio 1.4 1.2 - 2.2   Bilirubin Total 0.4 0.0 - 1.2 mg/dL   Alkaline Phosphatase 73 39 - 117 IU/L   LDH 143 121 - 224 IU/L   AST 26 0 - 40 IU/L   ALT 16 0 - 44 IU/L   GGT 11 0 - 65 IU/L   Iron 56 38 - 169 ug/dL   Cholesterol, Total 121 100 - 199 mg/dL   Triglycerides 68 0 - 149 mg/dL   HDL 41 >39 mg/dL   VLDL Cholesterol Cal 14 5 - 40  mg/dL   LDL Chol Calc (NIH) 66 0 - 99 mg/dL   Chol/HDL Ratio 3.0 0.0 - 5.0 ratio    Comment:                                   T. Chol/HDL Ratio                                             Men  Women                               1/2 Avg.Risk  3.4    3.3                                   Avg.Risk  5.0    4.4                                2X Avg.Risk  9.6    7.1                                3X Avg.Risk 23.4   11.0    Estimated CHD Risk  < 0.5 0.0 - 1.0 times avg.    Comment: The CHD Risk is based on the T. Chol/HDL ratio. Other factors affect CHD Risk such as hypertension, smoking, diabetes, severe obesity, and family history of premature CHD.    TSH 3.500 0.450 - 4.500 uIU/mL   T4, Total 6.5 4.5 - 12.0 ug/dL   T3 Uptake Ratio 25 24 - 39 %   Free Thyroxine Index 1.6 1.2 - 4.9   WBC 7.1 3.4 - 10.8 x10E3/uL   RBC 4.77 4.14 - 5.80 x10E6/uL   Hemoglobin 15.6 13.0 - 17.7 g/dL   Hematocrit 44.6 37.5 - 51.0 %   MCV 94 79 - 97 fL   MCH 32.7 26.6 - 33.0 pg   MCHC 35.0 31.5 - 35.7 g/dL   RDW 12.3 11.6 - 15.4 %   Platelets 201 150 - 450 x10E3/uL   Neutrophils 56 Not Estab. %   Lymphs 33 Not Estab. %   Monocytes 8 Not Estab. %   Eos 1 Not Estab. %   Basos 1 Not Estab. %   Neutrophils Absolute 4.0 1.4 - 7.0 x10E3/uL   Lymphocytes Absolute 2.4 0.7 - 3.1 x10E3/uL   Monocytes Absolute 0.6  0.1 - 0.9 x10E3/uL   EOS (ABSOLUTE) 0.1 0.0 - 0.4 x10E3/uL   Basophils Absolute 0.0 0.0 - 0.2 x10E3/uL   Immature Granulocytes 1 Not Estab. %   Immature Grans (Abs) 0.0 0.0 - 0.1 x10E3/uL  POCT urinalysis dipstick     Status: Abnormal   Collection Time: 05/23/19  9:29 AM  Result Value Ref Range   Color, UA Yellow    Clarity, UA Clear    Glucose, UA Negative Negative   Bilirubin, UA Negative    Ketones, UA Negative    Spec Grav, UA >=1.030 (A) 1.010 - 1.025   Blood, UA Negative    pH, UA 6.0 5.0 - 8.0   Protein, UA Negative Negative   Urobilinogen, UA 0.2 0.2 or 1.0 E.U./dL   Nitrite, UA  Negative    Leukocytes, UA Negative Negative   Appearance     Odor      DIFFERENTIAL DIAGNOSIS  Annual physical examination  ASSESSMENT AND PLAN  Annual physical examination   Plan: The patient had presented for annual physical examination. Patient's labs were all reassuring.  He has some occasional left and right shoulder pain.  This is likely more of a chronic issue related to distant surgery.  He had a scratchy throat today but otherwise looks well.  Follow-up as needed.  Lenise Arena MD    Note: This note was generated in part or whole with voice recognition software. Voice recognition is usually quite accurate but there are transcription errors that can and very often do occur. I apologize for any typographical errors that were not detected and corrected.

## 2019-06-13 ENCOUNTER — Ambulatory Visit: Payer: Self-pay | Admitting: Physician Assistant

## 2019-06-13 ENCOUNTER — Other Ambulatory Visit: Payer: Self-pay

## 2019-06-13 ENCOUNTER — Encounter: Payer: Self-pay | Admitting: Physician Assistant

## 2019-06-13 VITALS — BP 111/79 | HR 52 | Temp 97.8°F | Resp 14 | Ht 69.0 in | Wt 151.8 lb

## 2019-06-13 DIAGNOSIS — W57XXXA Bitten or stung by nonvenomous insect and other nonvenomous arthropods, initial encounter: Secondary | ICD-10-CM

## 2019-06-13 MED ORDER — DOXYCYCLINE MONOHYDRATE 100 MG PO CAPS: 100.0000 mg | ORAL_CAPSULE | Freq: Two times a day (BID) | ORAL | 0 refills | Status: DC

## 2019-06-13 MED ORDER — HYDROXYZINE HCL 50 MG PO TABS: 50.0000 mg | ORAL_TABLET | Freq: Three times a day (TID) | ORAL | 0 refills | Status: DC | PRN

## 2019-06-13 NOTE — Progress Notes (Signed)
   Subjective: Tick bite    Patient ID: Chase Stewart, male    DOB: [birth year], 34 y.o.   MRN: [id number]  HPI Patient state remove multiple ticks from trunk and lower extremities last week.  Patient state approximately 24 hours after being in a wooded area he started noticed ticks in the areas listed above.  Patient states success removed ticks.  Patient state tick did not appear to be engorged.  Patient state developed papular lesions on erythematous base with increased itching.  Patient denies fever/chills.  Patient denies headache, nausea, vomiting.   Review of Systems    Negative set for complaint Objective:   Physical Exam No acute distress.  Vital signs stable.  Examination of the trunk and back reveals papular lesion on erythematous base.  Similar lesions on the bilateral lower extremity.  Side excoriation on the lower extremities.       Assessment & Plan: Tick bites  Discussed sequela for tick bites.  Discussed rationale for not drawing labs at this time.  Patient given a prescription for Atarax doxycycline.  Advised follow-up if condition worsens.

## 2019-07-02 DIAGNOSIS — Z03818 Encounter for observation for suspected exposure to other biological agents ruled out: Secondary | ICD-10-CM | POA: Diagnosis not present

## 2019-07-02 DIAGNOSIS — J069 Acute upper respiratory infection, unspecified: Secondary | ICD-10-CM | POA: Diagnosis not present

## 2019-07-25 DIAGNOSIS — R519 Headache, unspecified: Secondary | ICD-10-CM | POA: Diagnosis not present

## 2019-07-25 DIAGNOSIS — M9901 Segmental and somatic dysfunction of cervical region: Secondary | ICD-10-CM | POA: Diagnosis not present

## 2019-07-25 DIAGNOSIS — M5412 Radiculopathy, cervical region: Secondary | ICD-10-CM | POA: Diagnosis not present

## 2019-07-25 DIAGNOSIS — M9902 Segmental and somatic dysfunction of thoracic region: Secondary | ICD-10-CM | POA: Diagnosis not present

## 2019-08-22 DIAGNOSIS — M9902 Segmental and somatic dysfunction of thoracic region: Secondary | ICD-10-CM | POA: Diagnosis not present

## 2019-08-22 DIAGNOSIS — M9901 Segmental and somatic dysfunction of cervical region: Secondary | ICD-10-CM | POA: Diagnosis not present

## 2019-08-22 DIAGNOSIS — R519 Headache, unspecified: Secondary | ICD-10-CM | POA: Diagnosis not present

## 2019-08-22 DIAGNOSIS — M5412 Radiculopathy, cervical region: Secondary | ICD-10-CM | POA: Diagnosis not present

## 2019-10-02 DIAGNOSIS — M9901 Segmental and somatic dysfunction of cervical region: Secondary | ICD-10-CM | POA: Diagnosis not present

## 2019-10-02 DIAGNOSIS — M9902 Segmental and somatic dysfunction of thoracic region: Secondary | ICD-10-CM | POA: Diagnosis not present

## 2019-10-02 DIAGNOSIS — R519 Headache, unspecified: Secondary | ICD-10-CM | POA: Diagnosis not present

## 2019-10-02 DIAGNOSIS — M5412 Radiculopathy, cervical region: Secondary | ICD-10-CM | POA: Diagnosis not present

## 2019-10-15 ENCOUNTER — Encounter: Payer: Self-pay | Admitting: Emergency Medicine

## 2019-10-15 ENCOUNTER — Ambulatory Visit: Payer: Self-pay | Admitting: Emergency Medicine

## 2019-10-15 ENCOUNTER — Other Ambulatory Visit: Payer: Self-pay

## 2019-10-15 VITALS — BP 112/75 | HR 51 | Temp 97.6°F | Resp 14 | Ht 69.0 in | Wt 150.0 lb

## 2019-10-15 DIAGNOSIS — R21 Rash and other nonspecific skin eruption: Secondary | ICD-10-CM

## 2019-10-15 DIAGNOSIS — L247 Irritant contact dermatitis due to plants, except food: Secondary | ICD-10-CM

## 2019-10-15 MED ORDER — PREDNISONE 10 MG PO TABS: ORAL_TABLET | ORAL | 0 refills | Status: DC

## 2019-10-15 NOTE — Progress Notes (Signed)
Pt presents today with rash on left arm for about 2 weeks. Rash inside of deltoid muscle (red patch) itches but rash that goes down his arms (red dots) does not. Pt came in Friday and gave some hydrocortisone cream and pt stated it games some relief but the rash is no better. CL,RMA

## 2019-10-15 NOTE — Progress Notes (Signed)
I have reviewed the triage vital signs and the nursing notes.   HISTORY  Chief Complaint Rash  HPI Chase Stewart is a 34 y.o. male presents to the clinic with a rash to his left upper extremity.  Patient was given cortisone cream on Friday and has been using it with some improvement.  He states that the area is still there and itches less but has not completely resolved.  Patient has several other areas on his left upper extremity but no itching in these areas.  He denies any other symptoms.        Past Medical History:  Diagnosis Date  . Anxiety   . Chronic kidney disease    h/o kidney stones  . Depression   . Subclinical hypothyroidism     Patient Active Problem List   Diagnosis Date Noted  . Shoulder pain 08/28/2015    Past Surgical History:  Procedure Laterality Date  . ROTATOR CUFF REPAIR Right   . SHOULDER ARTHROSCOPY WITH OPEN ROTATOR CUFF REPAIR AND DISTAL CLAVICLE ACROMINECTOMY Left 05/26/2015   Procedure: SHOULDER ARTHROSCOPY WITH ANTERIOR AND SUPERIOR LABRAL REPAIR;  Surgeon: Juanell Fairly, MD;  Location: [facility] ORS;  Service: Orthopedics;  Laterality: Left;  . TONSILLECTOMY    . TYMPANOSTOMY TUBE PLACEMENT      Prior to Admission medications   Medication Sig Start Date End Date Taking? Authorizing Provider  fluticasone (FLONASE) 50 MCG/ACT nasal spray Place into the nose. 07/02/19  Yes [provider]  doxycycline (MONODOX) 100 MG capsule Take 1 capsule (100 mg total) by mouth 2 (two) times daily. 06/13/19   Joni Reining, PA-C  hydrOXYzine (ATARAX/VISTARIL) 50 MG tablet Take 1 tablet (50 mg total) by mouth 3 (three) times daily as needed for itching. 06/13/19   Joni Reining, PA-C  ondansetron (ZOFRAN) 4 MG tablet Take 1 tablet (4 mg total) by mouth every 8 (eight) hours as needed for nausea or vomiting. Patient not taking: Reported on 06/13/2019 05/26/15   Juanell Fairly, MD  oxyCODONE (OXY IR/ROXICODONE) 5 MG immediate release tablet Take 1-2  tablets (5-10 mg total) by mouth every 4 (four) hours as needed for severe pain. Patient not taking: Reported on 06/13/2019 05/26/15   Juanell Fairly, MD  predniSONE (DELTASONE) 10 MG tablet Take 6 tablets  today, on day 2 take 5 tablets, day 3 take 4 tablets, day 4 take 3 tablets, day 5 take  2 tablets and 1 tablet the last day 10/15/19   Tommi Rumps, PA-C  sertraline (ZOLOFT) 50 MG tablet Take 50 mg by mouth every morning.    [provider]    Allergies Patient has no known allergies.  Family History  Problem Relation Age of Onset  . Hyperthyroidism Father     Social History Social History   Tobacco Use  . Smoking status: Never Smoker  . Smokeless tobacco: Never Used  Substance Use Topics  . Alcohol use: No  . Drug use: No    Review of Systems Constitutional: No fever/chills Eyes: No visual changes. Cardiovascular: Denies chest pain. Respiratory: Denies shortness of breath. Musculoskeletal: Negative for muscle skeletal pain. Skin: Positive for rash. Neurological: Negative for headaches, focal weakness or numbness.   PHYSICAL EXAM: Constitutional: Alert and oriented. Well appearing and in no acute distress. Eyes: Conjunctivae are normal.  Head: Atraumatic. Nose: No congestion/rhinnorhea. Mouth/Throat: Mucous membranes are moist.  Oropharynx non-erythematous. Neck: No stridor. Cardiovascular: Normal rate, regular rhythm. Grossly normal heart sounds.  Good peripheral circulation. Respiratory:  Normal respiratory effort.  No retractions. Lungs CTAB. Musculoskeletal: Moves upper and lower extremities without any difficulty.  Normal gait was noted. Neurologic:  Normal speech and language. No gross focal neurologic deficits are appreciated. No gait instability. Skin:  Skin is warm, dry.  There is an erythematous cluster noted on the medial aspect of the upper left extremity with an erythematous base.  No vesicles are noted.  There are several small red papular  areas on the lower aspect/forearm and medial position.  No extending involvement into the shoulder, cervical spine or posterior thoracic area suggestive of herpes zoster.  Area is nontender to palpation. Psychiatric: Mood and affect are normal. Speech and behavior are normal.   ____________________________________________   FINAL CLINICAL IMPRESSION(S) / ED DIAGNOSES  Contact dermatitis.  Patient may continue using the over-the-counter cortisone cream that he has been using however after 4 days and no complete resolution he was placed on prednisone taper beginning with 60 mg and tapering down.  Patient is agreeable with this.  He will return to the clinic if any continued problems.   ED Discharge Orders         Ordered    predniSONE (DELTASONE) 10 MG tablet        10/15/19 1132          Note:  This document was prepared using Dragon voice recognition software and may include unintentional dictation errors.

## 2019-11-13 DIAGNOSIS — M5412 Radiculopathy, cervical region: Secondary | ICD-10-CM | POA: Diagnosis not present

## 2019-11-13 DIAGNOSIS — M9902 Segmental and somatic dysfunction of thoracic region: Secondary | ICD-10-CM | POA: Diagnosis not present

## 2019-11-13 DIAGNOSIS — M9901 Segmental and somatic dysfunction of cervical region: Secondary | ICD-10-CM | POA: Diagnosis not present

## 2019-11-13 DIAGNOSIS — R519 Headache, unspecified: Secondary | ICD-10-CM | POA: Diagnosis not present

## 2019-11-20 DIAGNOSIS — M5412 Radiculopathy, cervical region: Secondary | ICD-10-CM | POA: Diagnosis not present

## 2019-11-20 DIAGNOSIS — R519 Headache, unspecified: Secondary | ICD-10-CM | POA: Diagnosis not present

## 2019-11-20 DIAGNOSIS — M9901 Segmental and somatic dysfunction of cervical region: Secondary | ICD-10-CM | POA: Diagnosis not present

## 2019-11-20 DIAGNOSIS — M9902 Segmental and somatic dysfunction of thoracic region: Secondary | ICD-10-CM | POA: Diagnosis not present

## 2019-11-22 ENCOUNTER — Encounter (HOSPITAL_COMMUNITY): Payer: Self-pay

## 2019-11-22 ENCOUNTER — Encounter: Payer: Self-pay | Admitting: Physician Assistant

## 2019-11-22 ENCOUNTER — Other Ambulatory Visit: Payer: Self-pay

## 2019-11-22 ENCOUNTER — Emergency Department (HOSPITAL_COMMUNITY): Payer: 59

## 2019-11-22 ENCOUNTER — Emergency Department (HOSPITAL_COMMUNITY)
Admission: EM | Admit: 2019-11-22 | Discharge: 2019-11-22 | Disposition: A | Discharge status: Home / Self Care | Payer: 59 | Attending: Emergency Medicine | Admitting: Emergency Medicine

## 2019-11-22 ENCOUNTER — Ambulatory Visit: Payer: Self-pay | Admitting: Physician Assistant

## 2019-11-22 VITALS — BP 113/58 | HR 72 | Temp 98.2°F | Resp 14 | Ht 69.0 in | Wt 147.0 lb

## 2019-11-22 DIAGNOSIS — R519 Headache, unspecified: Secondary | ICD-10-CM | POA: Insufficient documentation

## 2019-11-22 DIAGNOSIS — Z79899 Other long term (current) drug therapy: Secondary | ICD-10-CM | POA: Diagnosis not present

## 2019-11-22 DIAGNOSIS — N189 Chronic kidney disease, unspecified: Secondary | ICD-10-CM | POA: Diagnosis not present

## 2019-11-22 DIAGNOSIS — G43909 Migraine, unspecified, not intractable, without status migrainosus: Secondary | ICD-10-CM | POA: Diagnosis not present

## 2019-11-22 DIAGNOSIS — G441 Vascular headache, not elsewhere classified: Secondary | ICD-10-CM

## 2019-11-22 HISTORY — DX: Migraine, unspecified, not intractable, without status migrainosus: G43.909

## 2019-11-22 LAB — BASIC METABOLIC PANEL
Anion gap: 7 (ref 5–15)
BUN: 22 mg/dL — ABNORMAL HIGH (ref 6–20)
CO2: 28 mmol/L (ref 22–32)
Calcium: 9.3 mg/dL (ref 8.9–10.3)
Chloride: 104 mmol/L (ref 98–111)
Creatinine, Ser: 1.05 mg/dL (ref 0.61–1.24)
GFR, Estimated: >60 mL/min (ref >60)
Glucose, Bld: 83 mg/dL (ref 70–99)
Potassium: 4 mmol/L (ref 3.5–5.1)
Sodium: 139 mmol/L (ref 135–145)

## 2019-11-22 LAB — CBC WITH DIFFERENTIAL/PLATELET
Abs Immature Granulocytes: 0.01 10*3/uL (ref 0.00–0.07)
Basophils Absolute: 0.1 10*3/uL (ref 0.0–0.1)
Basophils Relative: 1 %
Eosinophils Absolute: 0.1 10*3/uL (ref 0.0–0.5)
Eosinophils Relative: 2 %
HCT: 43.8 % (ref 39.0–52.0)
Hemoglobin: 15 g/dL (ref 13.0–17.0)
Immature Granulocytes: 0 %
Lymphocytes Relative: 38 %
Lymphs Abs: 2.9 10*3/uL (ref 0.7–4.0)
MCH: 32.1 pg (ref 26.0–34.0)
MCHC: 34.2 g/dL (ref 30.0–36.0)
MCV: 93.6 fL (ref 80.0–100.0)
Monocytes Absolute: 0.6 10*3/uL (ref 0.1–1.0)
Monocytes Relative: 8 %
Neutro Abs: 4 10*3/uL (ref 1.7–7.7)
Neutrophils Relative %: 51 %
Platelets: 204 10*3/uL (ref 150–400)
RBC: 4.68 MIL/uL (ref 4.22–5.81)
RDW: 12 % (ref 11.5–15.5)
WBC: 7.7 10*3/uL (ref 4.0–10.5)
nRBC: 0 % (ref 0.0–0.2)

## 2019-11-22 MED ORDER — DIPHENHYDRAMINE HCL 50 MG/ML IJ SOLN
50.0000 mg | Freq: Once | INTRAMUSCULAR | Status: AC
  Administered 2019-11-22: 50 mg via INTRAVENOUS
  Filled 2019-11-22: qty 1

## 2019-11-22 MED ORDER — IOHEXOL 350 MG/ML SOLN
75.0000 mL | Freq: Once | INTRAVENOUS | Status: AC | PRN
  Administered 2019-11-22: 75 mL via INTRAVENOUS

## 2019-11-22 MED ORDER — METOCLOPRAMIDE HCL 5 MG/ML IJ SOLN
10.0000 mg | Freq: Once | INTRAMUSCULAR | Status: AC
  Administered 2019-11-22: 10 mg via INTRAVENOUS
  Filled 2019-11-22: qty 2

## 2019-11-22 MED ORDER — SODIUM CHLORIDE 0.9 % IV BOLUS
1000.0000 mL | Freq: Once | INTRAVENOUS | Status: AC
  Administered 2019-11-22: 1000 mL via INTRAVENOUS

## 2019-11-22 MED ORDER — KETOROLAC TROMETHAMINE 15 MG/ML IJ SOLN
15.0000 mg | Freq: Once | INTRAMUSCULAR | Status: AC
  Administered 2019-11-22: 15 mg via INTRAVENOUS
  Filled 2019-11-22: qty 1

## 2019-11-22 NOTE — ED Triage Notes (Signed)
Pt reports that it was recommended by PCP to come to ED for CT due to migraine. Pt reports that he had visual disturbance to left eye. Pt reports that feels like he has a nerve running up neck into head possibly causing pain. Pt see's a chiropractor 2 x monthly to realign

## 2019-11-22 NOTE — Discharge Instructions (Addendum)
Your images were reassuring at this time. Please follow up with your PCP who you saw earlier today regarding your ED visit.   Return to the ED for any worsening symptoms

## 2019-11-22 NOTE — ED Provider Notes (Signed)
[facility] EMERGENCY DEPARTMENT Provider Note   CSN: [id number] Arrival date & time: 11/22/19  1600     History Chief Complaint  Patient presents with  . Migraine    Chase Stewart is a 34 y.o. male who presents to the ED today with complaint of gradual onset, constant, persistent, dull, headache behind bilateral eyes x 5 days. Pt reports he has been dealing with L shoulder blade pain for approximately 1 year and neck pain for approximately 6 months however he feels like his neck pain is now causing a headache. He mentions that he began having blurry vision to his left eye 6 days ago; this resolved the next day on its own however he then noticed a headache that has been persistent since. He went to see his PCP today who sent him here with concern for "vascular headache" and need for CT scans. Pt mentions that he sees a chiropractor for his neck and gets manipulations done - most recently on Tuesday after headache began. No worsening headache after manipulation. Denies double vision, photophobia, phonophobia, nausea, vomiting, unilateral weakness or numbness, confusion, speech changes, gait instability, or any other associated symptoms.   The history is provided by the patient and medical records.       Past Medical History:  Diagnosis Date  . Anxiety   . Chronic kidney disease    h/o kidney stones  . Depression   . Migraines     Patient Active Problem List   Diagnosis Date Noted  . Shoulder pain 08/28/2015    Past Surgical History:  Procedure Laterality Date  . ROTATOR CUFF REPAIR Right   . SHOULDER ARTHROSCOPY WITH OPEN ROTATOR CUFF REPAIR AND DISTAL CLAVICLE ACROMINECTOMY Left 05/26/2015   Procedure: SHOULDER ARTHROSCOPY WITH ANTERIOR AND SUPERIOR LABRAL REPAIR;  Surgeon: Juanell Fairly, MD;  Location: [facility] ORS;  Service: Orthopedics;  Laterality: Left;  . TONSILLECTOMY    . TYMPANOSTOMY TUBE PLACEMENT         Family History  Problem Relation Age of Onset  .  Hyperthyroidism Father     Social History   Tobacco Use  . Smoking status: Never Smoker  . Smokeless tobacco: Never Used  Substance Use Topics  . Alcohol use: No  . Drug use: No    Home Medications Prior to Admission medications   Medication Sig Start Date End Date Taking? Authorizing Provider  fluticasone (FLONASE) 50 MCG/ACT nasal spray Place into the nose. 07/02/19   [provider]  sertraline (ZOLOFT) 50 MG tablet Take 50 mg by mouth every morning.    [provider]  valACYclovir (VALTREX) 1000 MG tablet Take 2,000 mg by mouth 2 (two) times daily. 10/30/19   [provider]    Allergies    Patient has no known allergies.  Review of Systems   Review of Systems  Constitutional: Negative for chills and fever.  Eyes: Positive for visual disturbance (blurry vision in L eye, resolved). Negative for photophobia and pain.  Gastrointestinal: Negative for nausea and vomiting.  Musculoskeletal: Positive for neck pain. Negative for neck stiffness.  Skin: Negative for rash.  Neurological: Positive for headaches. Negative for speech difficulty, weakness and numbness.  All other systems reviewed and are negative.   Physical Exam Updated Vital Signs BP 113/81 (BP Location: Right Arm)   Pulse (!) 56   Temp 98.4 F (36.9 C) (Oral)   Resp 16   Ht 5\' 9"  (1.753 m)   Wt 66.7 kg   SpO2 100%  BMI 21.71 kg/m   Physical Exam Vitals and nursing note reviewed.  Constitutional:      Appearance: He is not ill-appearing or diaphoretic.  HENT:     Head: Normocephalic and atraumatic.  Eyes:     Extraocular Movements: Extraocular movements intact.     Conjunctiva/sclera: Conjunctivae normal.     Pupils: Pupils are equal, round, and reactive to light.  Neck:     Comments: No midline C spinal TTP. Pt endorses "pain" when he rotates his neck. Negative brudzinki's and kernig's.  Cardiovascular:     Rate and Rhythm: Normal rate and regular rhythm.     Pulses:  Normal pulses.  Pulmonary:     Effort: Pulmonary effort is normal.     Breath sounds: Normal breath sounds. No wheezing, rhonchi or rales.  Abdominal:     Palpations: Abdomen is soft.     Tenderness: There is no abdominal tenderness. There is no guarding or rebound.  Musculoskeletal:     Cervical back: Neck supple.  Skin:    General: Skin is warm and dry.  Neurological:     Mental Status: He is alert.     Comments: CN 3-12 grossly intact A&O x4 GCS 15 Sensation and strength intact Gait nonataxic including with tandem walking Coordination with finger-to-nose WNL Neg romberg, neg pronator drift     ED Results / Procedures / Treatments   Labs (all labs ordered are listed, but only abnormal results are displayed) Labs Reviewed  BASIC METABOLIC PANEL - Abnormal; Notable for the following components:      Result Value   BUN 22 (*)    All other components within normal limits  CBC WITH DIFFERENTIAL/PLATELET    EKG None  Radiology CT Angio Head W/Cm &/Or Wo Cm  Result Date: 11/22/2019 CLINICAL DATA:  Headache and neck pain with blurry vision. EXAM: CT ANGIOGRAPHY HEAD AND NECK TECHNIQUE: Multidetector CT imaging of the head and neck was performed using the standard protocol during bolus administration of intravenous contrast. Multiplanar CT image reconstructions and MIPs were obtained to evaluate the vascular anatomy. Carotid stenosis measurements (when applicable) are obtained utilizing NASCET criteria, using the distal internal carotid diameter as the denominator. CONTRAST:  59mL OMNIPAQUE IOHEXOL 350 MG/ML SOLN COMPARISON:  Head MRI 04/15/2003 FINDINGS: CT HEAD FINDINGS Brain: There is no evidence of an acute infarct, intracranial hemorrhage, mass, midline shift, or extra-axial fluid collection. The ventricles and sulci are normal. Vascular: No hyperdense vessel. Skull: No fracture or suspicious osseous lesion. Sinuses: Visualized paranasal sinuses and mastoid air cells are clear.  Orbits: Unremarkable. Review of the MIP images confirms the above findings CTA NECK FINDINGS Aortic arch: Standard 3 vessel aortic arch with widely patent arch vessel origins. Right carotid system: Patent and smooth without evidence of stenosis or dissection. Left carotid system: Patent and smooth without evidence of stenosis or dissection. Vertebral arteries: Patent and smooth without evidence of stenosis or dissection. Codominant. Skeleton: No acute osseous abnormality or suspicious osseous lesion. Mild disc degeneration at C6-7. Other neck: No evidence of cervical lymphadenopathy or mass. Upper chest: Clear lung apices. Review of the MIP images confirms the above findings CTA HEAD FINDINGS Anterior circulation: The internal carotid arteries are widely patent from skull base to carotid termini. ACAs and MCAs are patent without evidence of a proximal branch occlusion or significant proximal stenosis. No aneurysm is identified. Posterior circulation: The intracranial vertebral arteries are widely patent to the basilar. Patent PICA and SCA origins are seen bilaterally. The basilar  artery is widely patent. There are right larger than left posterior communicating arteries. Both PCAs are patent without evidence of a significant proximal stenosis. No aneurysm is identified. Venous sinuses: Patent. Anatomic variants: None. Review of the MIP images confirms the above findings IMPRESSION: 1. Unremarkable CT appearance of the brain. No evidence of acute intracranial abnormality. 2. No large vessel occlusion, significant stenosis, or aneurysm in the head and neck. Electronically Signed   By: Sebastian Ache M.D.   On: 11/22/2019 19:57   CT Angio Neck W and/or Wo Contrast  Result Date: 11/22/2019 CLINICAL DATA:  Headache and neck pain with blurry vision. EXAM: CT ANGIOGRAPHY HEAD AND NECK TECHNIQUE: Multidetector CT imaging of the head and neck was performed using the standard protocol during bolus administration of  intravenous contrast. Multiplanar CT image reconstructions and MIPs were obtained to evaluate the vascular anatomy. Carotid stenosis measurements (when applicable) are obtained utilizing NASCET criteria, using the distal internal carotid diameter as the denominator. CONTRAST:  36mL OMNIPAQUE IOHEXOL 350 MG/ML SOLN COMPARISON:  Head MRI 04/15/2003 FINDINGS: CT HEAD FINDINGS Brain: There is no evidence of an acute infarct, intracranial hemorrhage, mass, midline shift, or extra-axial fluid collection. The ventricles and sulci are normal. Vascular: No hyperdense vessel. Skull: No fracture or suspicious osseous lesion. Sinuses: Visualized paranasal sinuses and mastoid air cells are clear. Orbits: Unremarkable. Review of the MIP images confirms the above findings CTA NECK FINDINGS Aortic arch: Standard 3 vessel aortic arch with widely patent arch vessel origins. Right carotid system: Patent and smooth without evidence of stenosis or dissection. Left carotid system: Patent and smooth without evidence of stenosis or dissection. Vertebral arteries: Patent and smooth without evidence of stenosis or dissection. Codominant. Skeleton: No acute osseous abnormality or suspicious osseous lesion. Mild disc degeneration at C6-7. Other neck: No evidence of cervical lymphadenopathy or mass. Upper chest: Clear lung apices. Review of the MIP images confirms the above findings CTA HEAD FINDINGS Anterior circulation: The internal carotid arteries are widely patent from skull base to carotid termini. ACAs and MCAs are patent without evidence of a proximal branch occlusion or significant proximal stenosis. No aneurysm is identified. Posterior circulation: The intracranial vertebral arteries are widely patent to the basilar. Patent PICA and SCA origins are seen bilaterally. The basilar artery is widely patent. There are right larger than left posterior communicating arteries. Both PCAs are patent without evidence of a significant proximal  stenosis. No aneurysm is identified. Venous sinuses: Patent. Anatomic variants: None. Review of the MIP images confirms the above findings IMPRESSION: 1. Unremarkable CT appearance of the brain. No evidence of acute intracranial abnormality. 2. No large vessel occlusion, significant stenosis, or aneurysm in the head and neck. Electronically Signed   By: Sebastian Ache M.D.   On: 11/22/2019 19:57    Procedures Procedures (including critical care time)  Medications Ordered in ED Medications  iohexol (OMNIPAQUE) 350 MG/ML injection 75 mL (75 mLs Intravenous Contrast Given 11/22/19 1915)  ketorolac (TORADOL) 15 MG/ML injection 15 mg (15 mg Intravenous Given 11/22/19 2107)  metoCLOPramide (REGLAN) injection 10 mg (10 mg Intravenous Given 11/22/19 2112)  diphenhydrAMINE (BENADRYL) injection 50 mg (50 mg Intravenous Given 11/22/19 2105)  sodium chloride 0.9 % bolus 1,000 mL (1,000 mLs Intravenous New Bag/Given 11/22/19 2103)    ED Course  I have reviewed the triage vital signs and the nursing notes.  Pertinent labs & imaging results that were available during my care of the patient were reviewed by me and considered in my medical  decision making (see chart for details).    MDM Rules/Calculators/A&P                          34 year old otherwise healthy male who presents to the ED after being instructed by his PCP to come for CT scan due to complaint of headache as well as visual disturbance of blurry vision to left eye prior to headache beginning several days ago.  Blurry vision resolved on its own in 1 day.  Subsequently patient complaining of neck pain that he has been dealing with for 6 months, sees a chiropractor for same with monthly realignments, most recently on Tuesday however this was a day after the headache began.  There was concern for possible vascular type headache and patient was sent to the ED for CT scan.  On arrival to the ED patient is afebrile, not tachycardic and nontachypneic.   He appears to be in no acute distress.  He is sitting comfortably upright in chair on exam.  He has no focal neuro deficits on exam today or meningeal signs.  He has no midline spinal tenderness palpation on exam, negative Brudzinski's, negative Kernig's.  He states the neck pain is only there when he moves his head.  He is concerned that the neck pain could be causing his headaches, no history of previous headaches.  I very much doubt any type of abnormality today causing headaches however will plan for CTA head and neck to assess for any type of vascular abnormality.  If negative we will plan to treat for migraine headache.  Will need labs prior to CTAs to check creatinine.  CBC without leukocytosis. Hgb stable at 15.0 BMP with stable creatinine 1.05  Lab Results  Component Value Date   CREATININE 1.05 11/22/2019   CREATININE 1.11 05/23/2019   CREATININE 1.08 05/21/2015   CTAs negative at this time. Will treat for migraine headache with toradol, reglan, benadryl and reassess.   After headache cocktail pt reports improvement in his symptoms. Headache no longer present. Will plan to discharge home at this time with close PCP follow up. Pt is in agreement with plan and stable for discharge.   This note was prepared using Dragon voice recognition software and may include unintentional dictation errors due to the inherent limitations of voice recognition software.   Final Clinical Impression(s) / ED Diagnoses Final diagnoses:  Bad headache    Rx / DC Orders ED Discharge Orders    None       Discharge Instructions     Your images were reassuring at this time. Please follow up with your PCP who you saw earlier today regarding your ED visit.   Return to the ED for any worsening symptoms       Tanda RockersVenter, Jayron Maqueda, Cordelia Poche-C 11/22/19 2213    Maia PlanLong, Joshua G, MD 11/23/19 1252

## 2019-11-22 NOTE — Progress Notes (Signed)
Pt presents today with neck and left shoulder pain. Pt states his neck has been bothering him for the past 6 months and his shoulder for about a year.( pt has had shoulder surgery) pt also states his Head aches started this week and Monday morning when he woke up he had blurry vision in the left eye until the next morning.  The pain radiates from his shoulder blade up into his neck. CL,rma

## 2019-11-22 NOTE — ED Notes (Signed)
Pt to CT

## 2019-11-22 NOTE — Progress Notes (Signed)
   Subjective: Headache/ blurry vision    Patient ID: Chase Stewart, male    DOB: 08-23-1985, 34 y.o.   MRN: [id number]  HPI Patient presents with multiple complaints.  First complaint is headache that has continued for the past 5 days.  Patient state 5 days ago he woke up with blurry vision that lasted greater than 24 hours.  Patient stated vision improved but he started developing headaches on the left side.  Patient denies vertigo or weakness.  Patient also complain of left shoulder and neck pain bothering him for the past 6 months.  States no specific provocative incident.  Patient state his surgery to the left labrum approximately 3 years ago.  Patient state seen a physical therapist for his neck pain.  Experiences mild transient relief with manipulations.  Denies weakness or loss of sensation to the right upper extremity. Review of Systems    Negative septal complaint Objective:   Physical Exam  No acute distress.  HEENT is unremarkable.  Cranial nerves II through XII grossly intact.  Patient has negative Romberg test.  No obvious deformity to the left shoulder.  Patient has full and equal range of motion of the left shoulder.      Assessment & Plan: Headache and left shoulder pain.  Advised patient definitive evaluationby emergency department is warranted at this time.

## 2019-11-26 ENCOUNTER — Other Ambulatory Visit: Payer: Self-pay

## 2019-11-26 ENCOUNTER — Encounter: Payer: Self-pay | Admitting: Nurse Practitioner

## 2019-11-26 ENCOUNTER — Ambulatory Visit: Payer: Self-pay | Admitting: Nurse Practitioner

## 2019-11-26 VITALS — BP 115/73 | HR 62 | Temp 97.2°F | Resp 12

## 2019-11-26 DIAGNOSIS — M62838 Other muscle spasm: Secondary | ICD-10-CM

## 2019-11-26 MED ORDER — CYCLOBENZAPRINE HCL 10 MG PO TABS: 10.0000 mg | ORAL_TABLET | Freq: Every day | ORAL | 0 refills | Status: DC

## 2019-11-26 NOTE — Progress Notes (Addendum)
Subjective:    Patient ID: Chase Stewart, male    DOB: 1985-05-16, 34 y.o.   MRN: [id number]  HPI  34 year old male, seen in clinic 11/102/21. Concern for vascular HA and patient was sent from here to ED for evaluation.   CT clear, was treated with benadryl, tramadol and Reglan and discharged for f/u with PCP.   Since that time he has had a wavering HA that at times feels as though there is pulsations behind his eyes L worse than R. He had one episode of altered vision in left eye where he felt there was something "covering his eye" for one day, then symptoms resolved. This was prior to ED evaluation.   He had Lasik eye surgery over 10 years ago, he has not had any recent follow up.  Also notes he had "eye headaches as a child.   Describes pain today as a pressure/pulsation behind eyes.   He also has some tightness in lower cervical region that seems to originate from left posterior shoulder region. Had surgery to left labrum 4 years ago. Feel pain more when turning his head right and left though no limited range of motion.   Today's Vitals   11/26/19 1421  BP: 115/73  Pulse: 62  Resp: 12  Temp: (!) 97.2 F (36.2 C)  TempSrc: Oral  SpO2: 100%   There is no height or weight on file to calculate BMI.   Currently using some advil or tylenol for relief. Takes no prescription medications.    Review of Systems  Constitutional: Negative.   HENT: Negative.   Eyes: Positive for pain and visual disturbance.  Respiratory: Negative.   Cardiovascular: Negative.   Neurological: Positive for headaches.  Psychiatric/Behavioral: Negative.    Past Medical History:  Diagnosis Date  . Anxiety   . Chronic kidney disease    h/o kidney stones  . Depression   . Migraines       Objective:   Physical Exam Constitutional:      Appearance: Normal appearance.  HENT:     Head: Normocephalic.  Eyes:     Extraocular Movements: Extraocular movements intact.     Conjunctiva/sclera:  Conjunctivae normal.     Pupils: Pupils are equal, round, and reactive to light.  Cardiovascular:     Rate and Rhythm: Normal rate and regular rhythm.  Pulmonary:     Effort: Pulmonary effort is normal.     Breath sounds: Normal breath sounds.  Musculoskeletal:       Arms:     Cervical back: Normal range of motion and neck supple.     Comments: Marked region of tightness and tenderness  Neurological:     General: No focal deficit present.     Mental Status: He is alert and oriented to person, place, and time.  Psychiatric:        Mood and Affect: Mood normal.        Behavior: Behavior normal.        Thought Content: Thought content normal.        Judgment: Judgment normal.           Assessment & Plan:  Reviewed images and notes from ED  Symptoms not consistent with migraines at this time, potentially from eye strain-would like patient to see opthalmology within one week for dilated eye exam.  Discussed posture and tightness in upper back and left shoulder, recommending stretching- frequent position changes, massage and as needed muscle relaxer at night.  Meds ordered this encounter  Medications  . cyclobenzaprine (FLEXERIL) 10 MG tablet    Sig: Take 1 tablet (10 mg total) by mouth at bedtime. As needed for muscle tightness    Dispense:  30 tablet    Refill:  0    May try OTC dissolvable HA medication for relief - example bc powder  After evaluation by Optho and one week trial of Flexeril RTC if HA persists earlier with any new concerns as discussed. Seek immediate medical attention for any acute worsening symptoms including recurrent visual disturbances.

## 2019-11-26 NOTE — Progress Notes (Signed)
Notes from 11/22/19-  Pt presents today with neck and left shoulder pain. Pt states his neck has been bothering him for the past 6 months and his shoulder for about a year.( pt has had shoulder surgery) pt also states his Head aches started this week and Monday morning when he woke up he had blurry vision in the left eye until the next morning.  The pain radiates from his shoulder blade up into his neck. CL,rma  Was seen by Ron on Friday 11/22/19 & sent to [facility] ED so that he could get a CT scan.  CT was performed & results are negative.  Presents today because he's still having a headache.  ED gave him Benadryl & Tramadol through his IV, but he didn't receive any Rx's  Taking Ibuprofen, Tylenol, Heat compresses with minimal relief.  Shoulder pain ongoing for a 1 year. Neck pain ongoing for 6 months. Pain in eyes ongoing for a week.  Body feels tired & drained.  No known injury.  AMD

## 2019-11-27 DIAGNOSIS — H5201 Hypermetropia, right eye: Secondary | ICD-10-CM | POA: Diagnosis not present

## 2019-12-16 ENCOUNTER — Other Ambulatory Visit: Payer: Self-pay

## 2019-12-16 ENCOUNTER — Ambulatory Visit: Payer: Self-pay | Admitting: Physician Assistant

## 2019-12-16 ENCOUNTER — Other Ambulatory Visit: Payer: Self-pay | Admitting: Physician Assistant

## 2019-12-16 ENCOUNTER — Encounter: Payer: Self-pay | Admitting: Physician Assistant

## 2019-12-16 VITALS — BP 116/80 | HR 72 | Temp 97.5°F | Resp 14 | Ht 69.0 in | Wt 144.0 lb

## 2019-12-16 DIAGNOSIS — R634 Abnormal weight loss: Secondary | ICD-10-CM

## 2019-12-16 MED ORDER — LIDOCAINE 5 % EX PTCH: 1.0000 | MEDICATED_PATCH | Freq: Two times a day (BID) | CUTANEOUS | 0 refills | Status: DC

## 2019-12-16 MED ORDER — ORPHENADRINE CITRATE ER 100 MG PO TB12: 100.0000 mg | ORAL_TABLET | Freq: Two times a day (BID) | ORAL | 0 refills | Status: DC

## 2019-12-16 MED ORDER — ESOMEPRAZOLE MAGNESIUM 40 MG PO CPDR: 40.0000 mg | DELAYED_RELEASE_CAPSULE | Freq: Every day | ORAL | 0 refills | Status: DC

## 2019-12-16 NOTE — Progress Notes (Signed)
Pt presents today for follow up on back pain. Pt states he took the muscle relaxer's for about 3 days and its made him constipated. Pt would like something else recommended or advice on what can be done next. CL,RMA

## 2019-12-16 NOTE — Progress Notes (Signed)
   Subjective: Upper back pain and weight loss    Patient ID: Chase Stewart, male    DOB: 11/06/1985, 34 y.o.   MRN: [id number]  HPI Patient complaint continue upper back pain left side greater than right.  Patient also states he is concerned about weight loss since he is not dieting.  Patient state was seen about 10 days ago and was given muscle relaxer which he cannot tolerate secondary to developing constipation.  Patient state he is getting anxious over the continued back pain and the report notes.  Patient weight was 66.7 kg on 11/22/2019.  Patient weight today is 65.3 kg.  Review of Systems Back pain.    Objective:   Physical Exam  Patient is no acute distress.  HEENT is unremarkable.  Neck is supple for adenopathy or bruits.  Lungs are clear to auscultation.  Heart regular rate and rhythm.  Patient is moderate guarding palpation of the mid parascapular area.  Patient has full and equal range of motion of the upper extremities.      Assessment & Plan: Muscle skeletal pain and weight loss.  Advised patient we will switch his muscle relaxer to Norflex twice daily along with Lidoderm patches twice daily for 5 days.  Patient had labs drawn for thyroid panel with TSH, sed rate, and ANA.  Patient return back in 4 days for reevaluation

## 2019-12-17 LAB — SEDIMENTATION RATE: Sed Rate: 2 mm/hr (ref 0–15)

## 2019-12-17 LAB — ANA

## 2019-12-17 LAB — THYROID PANEL WITH TSH

## 2019-12-18 LAB — ANA: Anti Nuclear Antibody (ANA): NEGATIVE

## 2019-12-19 DIAGNOSIS — M9901 Segmental and somatic dysfunction of cervical region: Secondary | ICD-10-CM | POA: Diagnosis not present

## 2019-12-19 DIAGNOSIS — M5412 Radiculopathy, cervical region: Secondary | ICD-10-CM | POA: Diagnosis not present

## 2019-12-19 DIAGNOSIS — M9902 Segmental and somatic dysfunction of thoracic region: Secondary | ICD-10-CM | POA: Diagnosis not present

## 2019-12-19 DIAGNOSIS — R519 Headache, unspecified: Secondary | ICD-10-CM | POA: Diagnosis not present

## 2019-12-19 LAB — THYROID PANEL WITH TSH
Free Thyroxine Index: 1.7 (ref 1.2–4.9)
T3 Uptake Ratio: 24 % (ref 24–39)
T4, Total: 7.1 ug/dL (ref 4.5–12.0)
TSH: 5.28 u[IU]/mL — ABNORMAL HIGH (ref 0.450–4.500)

## 2019-12-19 LAB — SPECIMEN STATUS REPORT

## 2019-12-20 ENCOUNTER — Other Ambulatory Visit: Payer: Self-pay

## 2019-12-20 ENCOUNTER — Encounter: Payer: Self-pay | Admitting: Physician Assistant

## 2019-12-20 ENCOUNTER — Ambulatory Visit: Payer: Self-pay | Admitting: Physician Assistant

## 2019-12-20 VITALS — BP 116/75 | HR 71 | Temp 97.8°F | Resp 14 | Ht 69.0 in | Wt 145.0 lb

## 2019-12-20 DIAGNOSIS — S46912D Strain of unspecified muscle, fascia and tendon at shoulder and upper arm level, left arm, subsequent encounter: Secondary | ICD-10-CM

## 2019-12-20 DIAGNOSIS — R7989 Other specified abnormal findings of blood chemistry: Secondary | ICD-10-CM

## 2019-12-20 NOTE — Addendum Note (Signed)
Addended by: Gardner Candle on: 12/20/2019 10:17 AM   Modules accepted: Orders

## 2019-12-20 NOTE — Addendum Note (Signed)
Addended by: Gardner Candle on: 12/20/2019 10:19 AM   Modules accepted: Orders

## 2019-12-20 NOTE — Progress Notes (Signed)
Chase Stewart is an established patient of [facility]. Physical Therapy referral made with [facility]. Scheduled to see [facility] on 01/09/20 at 8:30 am.  AMD

## 2019-12-20 NOTE — Progress Notes (Signed)
   Subjective: Weight loss and left scapular pain.    Patient ID: Chase Stewart, male    DOB: [birth year], 34 y.o.   MRN: [id number]  HPI Patient follow-up for lab results secondary to fatigue and weight loss.  Patient also state left scapular pain has not improved with muscle relaxants.   Review of Systems     Anxiety Objective:   Physical Exam No acute distress.  Patient had a 6 pound weight loss in the last 30 days.  Patient has full equal range of motion of the left upper extremity.  Patient is moderate guarding of the left scapular at the mid medial aspect.  Patient has elevated TSH of 5.280.        Assessment & Plan: Left scapular strain and hypothyroidism.  Patient's left scapular complaint has persisted over a year.  Refractory to conservative treatment and muscle relaxants.  Patient will be consulted for physical therapy.  Patient has elevated TSH with normal ranges with T4 and T3.  Patient will be further evaluated by endocrinology for hypothyroidism.

## 2020-01-09 DIAGNOSIS — M25512 Pain in left shoulder: Secondary | ICD-10-CM | POA: Diagnosis not present

## 2020-01-09 DIAGNOSIS — M542 Cervicalgia: Secondary | ICD-10-CM | POA: Diagnosis not present

## 2020-01-09 NOTE — Addendum Note (Signed)
Addended by: Gardner Candle on: 01/09/2020 02:06 PM   Modules accepted: Orders

## 2020-01-13 ENCOUNTER — Ambulatory Visit: Payer: Self-pay | Admitting: "Endocrinology

## 2020-01-13 DIAGNOSIS — M9901 Segmental and somatic dysfunction of cervical region: Secondary | ICD-10-CM | POA: Diagnosis not present

## 2020-01-13 DIAGNOSIS — M62830 Muscle spasm of back: Secondary | ICD-10-CM | POA: Diagnosis not present

## 2020-01-13 DIAGNOSIS — M5414 Radiculopathy, thoracic region: Secondary | ICD-10-CM | POA: Diagnosis not present

## 2020-01-13 DIAGNOSIS — M9902 Segmental and somatic dysfunction of thoracic region: Secondary | ICD-10-CM | POA: Diagnosis not present

## 2020-01-14 ENCOUNTER — Ambulatory Visit (INDEPENDENT_AMBULATORY_CARE_PROVIDER_SITE_OTHER): Payer: 59 | Admitting: "Endocrinology

## 2020-01-14 ENCOUNTER — Other Ambulatory Visit: Payer: Self-pay

## 2020-01-14 ENCOUNTER — Encounter: Payer: Self-pay | Admitting: "Endocrinology

## 2020-01-14 VITALS — BP 118/72 | HR 55 | Ht 69.0 in | Wt 145.8 lb

## 2020-01-14 DIAGNOSIS — E038 Other specified hypothyroidism: Secondary | ICD-10-CM

## 2020-01-14 NOTE — Progress Notes (Signed)
Endocrinology Consult Note                                            01/14/2020, 8:56 PM   Subjective:    Patient ID: Chase Stewart, male    DOB: [birth year], PCP Patient, No Pcp Per   Past Medical History:  Diagnosis Date  . Anxiety   . Chronic kidney disease    h/o kidney stones  . Depression   . Migraines    Past Surgical History:  Procedure Laterality Date  . ROTATOR CUFF REPAIR Right   . SHOULDER ARTHROSCOPY WITH OPEN ROTATOR CUFF REPAIR AND DISTAL CLAVICLE ACROMINECTOMY Left 05/26/2015   Procedure: SHOULDER ARTHROSCOPY WITH ANTERIOR AND SUPERIOR LABRAL REPAIR;  Surgeon: Thornton Park, MD;  Location: [facility] ORS;  Service: Orthopedics;  Laterality: Left;  . TONSILLECTOMY    . TYMPANOSTOMY TUBE PLACEMENT     Social History   Socioeconomic History  . Marital status: Married    Spouse name: Not on file  . Number of children: Not on file  . Years of education: Not on file  . Highest education level: Not on file  Occupational History  . Not on file  Tobacco Use  . Smoking status: Never Smoker  . Smokeless tobacco: Never Used  Vaping Use  . Vaping Use: Never used  Substance and Sexual Activity  . Alcohol use: No  . Drug use: Not Currently    Types: Marijuana  . Sexual activity: Not on file  Other Topics Concern  . Not on file  Social History Narrative  . Not on file   Social Determinants of Health   Financial Resource Strain: Not on file  Food Insecurity: Not on file  Transportation Needs: Not on file  Physical Activity: Not on file  Stress: Not on file  Social Connections: Not on file   Family History  Problem Relation Age of Onset  . Hyperthyroidism Father    Outpatient Encounter Medications as of 01/14/2020  Medication Sig  . [DISCONTINUED] cyclobenzaprine (FLEXERIL) 10 MG tablet Take 1 tablet (10 mg total) by mouth at bedtime. As needed for muscle tightness  . [DISCONTINUED] esomeprazole (NEXIUM) 40 MG capsule Take 1 capsule (40 mg total) by  mouth daily.  . [DISCONTINUED] lidocaine (LIDODERM) 5 % Place 1 patch onto the skin in the morning and at bedtime. Remove & Discard patch within 12 hours or as directed by MD  . [DISCONTINUED] orphenadrine (NORFLEX) 100 MG tablet Take 1 tablet (100 mg total) by mouth 2 (two) times daily.  . [DISCONTINUED] valACYclovir (VALTREX) 1000 MG tablet Take 2,000 mg by mouth 2 (two) times daily.   No facility-administered encounter medications on file as of 01/14/2020.   ALLERGIES: No Known Allergies  VACCINATION STATUS: Immunization History  Administered Date(s) Administered  . DTaP 08/12/1987, 01/19/1988, 06/14/1988, 09/25/1989, 04/15/1992  . Hepatitis B 11/19/1998, 12/24/1998, 03/25/1999  . HiB (PRP-OMP) 11/24/1988  . IPV 08/12/1987, 01/19/1988, 11/24/1988, 04/15/1992  . MMR 11/24/1988, 04/15/1992  . PPD Test 05/05/2015  . Td 12/21/2001, 08/26/2012  . Tdap 05/26/2011    HPI Chase Stewart is 35 y.o. male who presents today with a medical history as above. History is obtained directly from the patient.  He denies any prior history of thyroid dysfunction. he is being seen in consultation for elevated TSH requested by Mardee Postin, PA.  he has been dealing with symptoms of fatigue and diffuse body aches.  He underwent thyroid function test in December 2021.  This test showed slightly elevated TSH of 5.2, normal T4.  He has not initiated on thyroid hormones or antithyroid intervention. He denies any prior history of thyroidectomy nor thyroid ablation.  He has family history of what appears to be hypothyroidism. He reports fluctuating body weight, no heat/cold intolerance.  No palpitations, tremors, heat/cold intolerance.   Review of Systems  Constitutional: + Fluctuating body weight, + fatigue, no subjective hyperthermia, no subjective hypothermia Eyes: no blurry vision, no xerophthalmia ENT: no sore throat, no nodules palpated in throat, no dysphagia/odynophagia, no  hoarseness Cardiovascular: no Chest Pain, no Shortness of Breath, no palpitations, no leg swelling Respiratory: no cough, no shortness of breath Gastrointestinal: no Nausea/Vomiting/Diarhhea Musculoskeletal: no muscle/joint aches Skin: no rashes Neurological: no tremors, no numbness, no tingling, no dizziness Psychiatric: no depression, no anxiety  Objective:    Vitals with BMI 01/14/2020 12/20/2019 12/16/2019  Height _0  _1  _2   Weight 145 lbs 13 oz 145 lbs 144 lbs  BMI 21.52 25.3 66.44  Systolic 034 742 595  Diastolic 72 75 80  Pulse 55 71 72    BP 118/72   Pulse (!) 55   Ht _3  (1.753 m)   Wt 145 lb 12.8 oz (66.1 kg)   BMI 21.53 kg/m   Wt Readings from Last 3 Encounters:  01/14/20 145 lb 12.8 oz (66.1 kg)  12/20/19 145 lb (65.8 kg)  12/16/19 144 lb (65.3 kg)    Physical Exam  Constitutional:  Body mass index is 21.53 kg/m.,  not in acute distress, normal state of mind Eyes: PERRLA, EOMI, no exophthalmos ENT: moist mucous membranes, no gross thyromegaly, no gross cervical lymphadenopathy Cardiovascular: normal precordial activity, Regular Rate and Rhythm, no Murmur/Rubs/Gallops Respiratory:  adequate breathing efforts, no gross chest deformity, Clear to auscultation bilaterally Gastrointestinal: abdomen soft, Non -tender, No distension, Bowel Sounds present, no gross organomegaly Musculoskeletal: no gross deformities, strength intact in all four extremities Skin: moist, warm, no rashes Neurological: no tremor with outstretched hands, Deep tendon reflexes normal in bilateral lower extremities.  CMP ( most recent) CMP     Component Value Date/Time   NA 139 11/22/2019 1835   NA 142 05/23/2019 0910   K 4.0 11/22/2019 1835   CL 104 11/22/2019 1835   CO2 28 11/22/2019 1835   GLUCOSE 83 11/22/2019 1835   BUN 22 (H) 11/22/2019 1835   BUN 17 05/23/2019 0910   CREATININE 1.05 11/22/2019 1835   CALCIUM 9.3 11/22/2019 1835   PROT 7.9 05/23/2019 0910   ALBUMIN  4.6 05/23/2019 0910   AST 26 05/23/2019 0910   ALT 16 05/23/2019 0910   ALKPHOS 73 05/23/2019 0910   BILITOT 0.4 05/23/2019 0910   GFRNONAA >60 11/22/2019 1835   GFRAA 101 05/23/2019 0910     Diabetic Labs (most recent): No results found for: HGBA1C   Lipid Panel ( most recent) Lipid Panel     Component Value Date/Time   CHOL 121 05/23/2019 0910   TRIG 68 05/23/2019 0910   HDL 41 05/23/2019 0910   CHOLHDL 3.0 05/23/2019 0910   LDLCALC 66 05/23/2019 0910   LABVLDL 14 05/23/2019 0910      Lab Results  Component Value Date   TSH CANCELED 12/16/2019   TSH 5.280 (H) 12/16/2019   TSH 3.500 05/23/2019           Assessment &  Plan:   1. Subclinical hypothyroidism   - VIPUL CAFARELLI  is being seen at a kind request of Mardee Postin, [place] . - I have reviewed his available thyroid records and clinically evaluated the patient. - Based on these reviews, he has subclinical hypothyroidism,  however,  there is not sufficient information to proceed with definitive treatment plan. -He will not need immediate intervention with thyroid hormone.  - he will need a repeat,  more complete thyroid function tests towards confirming the diagnosis.  He will return in 66-monthwith repeat full profile thyroid function test, as well as measurement of total testosterone.  - I did not initiate any new prescriptions today. - he is advised to maintain close follow up with his PCP,  for primary care needs.   - Time spent with the patient: 60 minutes, of which >50% was spent in  counseling him about his subclinical hypothyroidism and the rest in obtaining information about his symptoms, reviewing his previous labs/studies ( including abstractions from other facilities),  evaluations, and treatments,  and developing a plan to confirm diagnosis and long term treatment based on the latest standards of care/guidelines; and documenting his care.  WLourdes Sledgeparticipated in the discussions, expressed  understanding, and voiced agreement with the above plans.  All questions were answered to his satisfaction. he is encouraged to contact clinic should he have any questions or concerns prior to his return visit.  Follow up plan: Return in about 4 months (around 05/13/2020) for F/U with Pre-visit Labs, Fasting Labs  in AM B4 8.   GGlade Lloyd MD CWilliam [facility]. Cle[facility] Jr. [facility] HospitalGroup RChildren'S Rehabilitation Center189[address]RLewistown [place] [address]Phone: [phone] Fax: [phone]    01/14/2020, 8:56 PM  This note was partially dictated with voice recognition software. Similar sounding words can be transcribed inadequately or may not  be corrected upon review.

## 2020-01-16 DIAGNOSIS — R001 Bradycardia, unspecified: Secondary | ICD-10-CM

## 2020-01-16 HISTORY — DX: Bradycardia, unspecified: R00.1

## 2020-01-17 DIAGNOSIS — R0789 Other chest pain: Secondary | ICD-10-CM | POA: Diagnosis not present

## 2020-01-17 DIAGNOSIS — R9431 Abnormal electrocardiogram [ECG] [EKG]: Secondary | ICD-10-CM | POA: Diagnosis not present

## 2020-01-17 DIAGNOSIS — R001 Bradycardia, unspecified: Secondary | ICD-10-CM | POA: Diagnosis not present

## 2020-01-17 DIAGNOSIS — R079 Chest pain, unspecified: Secondary | ICD-10-CM | POA: Diagnosis not present

## 2020-01-21 ENCOUNTER — Encounter: Payer: Self-pay | Admitting: Physician Assistant

## 2020-01-21 ENCOUNTER — Ambulatory Visit: Payer: Self-pay | Admitting: Physician Assistant

## 2020-01-21 ENCOUNTER — Other Ambulatory Visit: Payer: Self-pay

## 2020-01-21 VITALS — BP 119/76 | HR 65 | Temp 97.9°F | Ht 69.0 in | Wt 148.8 lb

## 2020-01-21 DIAGNOSIS — M791 Myalgia, unspecified site: Secondary | ICD-10-CM

## 2020-01-21 MED ORDER — ORPHENADRINE CITRATE ER 100 MG PO TB12: 100.0000 mg | ORAL_TABLET | Freq: Two times a day (BID) | ORAL | 0 refills | Status: DC

## 2020-01-21 NOTE — Progress Notes (Signed)
   Subjective: Myalgia    Patient ID: Chase Stewart, male    DOB: 27-Mar-1985, 35 y.o.   MRN: [id number]  HPI Presents patient presents status post evaluation endocrinology for his elevated TSH, cardiology for bradycardia and chest pain, orthopedics for mid back pain, and physical therapy.  Patient states endocrinology stated no medications at this time but repeat TSH in 3 months.  Cardiologist states asymptomatic bradycardia but we will schedule him for an echocardiogram later this month.  Orthopedic/PT will see patient next week for evaluation of low back pain.  Patient is requesting refill of muscle relaxants until he can see orthopedics.  Patient also mention that he was bitten by ticks 6 months ago.  Concern for Lyme disease and [facility] fever. Review of Systems Cervical ventriculography and myalgia.   Physical Exam  Deferred.      Assessment & Plan: Myalgia  Advised patient to continue with his specialty appointments.  We will get labs today for [facility] fever and Lyme disease.  Patient given a prescription for Norflex and we will advise him of the lab results.

## 2020-01-22 DIAGNOSIS — M25512 Pain in left shoulder: Secondary | ICD-10-CM | POA: Diagnosis not present

## 2020-01-22 DIAGNOSIS — M542 Cervicalgia: Secondary | ICD-10-CM | POA: Diagnosis not present

## 2020-01-23 ENCOUNTER — Encounter: Payer: Self-pay | Admitting: Adult Medicine

## 2020-01-23 ENCOUNTER — Ambulatory Visit: Payer: Self-pay | Admitting: Adult Medicine

## 2020-01-23 ENCOUNTER — Other Ambulatory Visit: Payer: Self-pay

## 2020-01-23 ENCOUNTER — Encounter: Payer: Self-pay | Admitting: Physician Assistant

## 2020-01-23 VITALS — BP 123/85 | HR 66 | Temp 97.0°F | Resp 12 | Ht 70.0 in | Wt 148.0 lb

## 2020-01-23 DIAGNOSIS — R7989 Other specified abnormal findings of blood chemistry: Secondary | ICD-10-CM

## 2020-01-23 DIAGNOSIS — A770 Spotted fever due to Rickettsia rickettsii: Secondary | ICD-10-CM

## 2020-01-23 LAB — LYMEAB(IGG/M)+RMSF(IGG/M)
LYME DISEASE AB, QUANT, IGM: <0.8 index (ref 0.00–0.79)
Lyme IgG/IgM Ab: <0.91 {ISR} (ref 0.00–0.90)
RMSF IgG: NEGATIVE
RMSF IgM: 1.29 index — ABNORMAL HIGH (ref 0.00–0.89)

## 2020-01-23 MED ORDER — DOXYCYCLINE HYCLATE 100 MG PO TABS: 100.0000 mg | ORAL_TABLET | Freq: Two times a day (BID) | ORAL | 0 refills | Status: AC

## 2020-01-23 NOTE — Progress Notes (Signed)
   Subjective:    Patient ID: Chase Stewart, male    DOB: February 12, 1985, 35 y.o.   MRN: [id number]  HPI  Multiple visits for shoulder back aches 34y Reports hx 8 tick bite 6/21 with myalgia pruiritis developed late summer worsening toward fall  Sent to specialist for eval, CTS reported neg. Rx norflex, sxs persist Labs 01/23/20 rec'd rev'd +RMSF IGM  FH  +father hypothyroid   Review of Systems Noncontributory to current reason for visit  Except moving pain across shoulder back chest. had occ HA incr nervousness anxiety most due to uncertainty after 3 practitioner eval      Objective:   Physical Exam: focused exam Normotensive O2 99% Heent [place]/at perrla no anisocoria eom intact   Chest/ Cardiac pmi non displaced NSR no murmers ectopy Pulm bronchovesicular BS Abd soft bs nl no organomegaly Extr  bilateral FRom, nl strength, some trigger pt muscle, equal hand grip Tenderness scapula upper back Skin  Old scars on abd from tic bites Neuro grossly intact      Assessment & Plan:   visit focused on discussion of [place] Fever RMSF  Due to length of time and persistent symptommatology Will treat 21d doxycycline. Client ask to notify if sxs persistent then f/u labs may be needed   Will f/u 6wk thyroid concerns voiced

## 2020-01-23 NOTE — Telephone Encounter (Signed)
Pt. Is calling abut his recent test results, specificaly his RMSF labs.  Can you please take a look at these today. Thank you.

## 2020-01-25 ENCOUNTER — Other Ambulatory Visit: Payer: Self-pay

## 2020-01-25 ENCOUNTER — Emergency Department (HOSPITAL_COMMUNITY): Payer: 59

## 2020-01-25 ENCOUNTER — Emergency Department (HOSPITAL_COMMUNITY)
Admission: EM | Admit: 2020-01-25 | Discharge: 2020-01-25 | Disposition: A | Discharge status: Home / Self Care | Payer: 59 | Attending: Emergency Medicine | Admitting: Emergency Medicine

## 2020-01-25 ENCOUNTER — Encounter (HOSPITAL_COMMUNITY): Payer: Self-pay | Admitting: Emergency Medicine

## 2020-01-25 DIAGNOSIS — M791 Myalgia, unspecified site: Secondary | ICD-10-CM

## 2020-01-25 DIAGNOSIS — N189 Chronic kidney disease, unspecified: Secondary | ICD-10-CM | POA: Insufficient documentation

## 2020-01-25 DIAGNOSIS — R519 Headache, unspecified: Secondary | ICD-10-CM | POA: Diagnosis not present

## 2020-01-25 DIAGNOSIS — R072 Precordial pain: Secondary | ICD-10-CM | POA: Diagnosis not present

## 2020-01-25 DIAGNOSIS — R079 Chest pain, unspecified: Secondary | ICD-10-CM | POA: Diagnosis not present

## 2020-01-25 DIAGNOSIS — R1013 Epigastric pain: Secondary | ICD-10-CM | POA: Diagnosis not present

## 2020-01-25 DIAGNOSIS — R0789 Other chest pain: Secondary | ICD-10-CM | POA: Diagnosis present

## 2020-01-25 LAB — COMPREHENSIVE METABOLIC PANEL
ALT: 18 U/L (ref 0–44)
AST: 21 U/L (ref 15–41)
Albumin: 4.7 g/dL (ref 3.5–5.0)
Alkaline Phosphatase: 56 U/L (ref 38–126)
Anion gap: 7 (ref 5–15)
BUN: 18 mg/dL (ref 6–20)
CO2: 26 mmol/L (ref 22–32)
Calcium: 9.2 mg/dL (ref 8.9–10.3)
Chloride: 102 mmol/L (ref 98–111)
Creatinine, Ser: 1.01 mg/dL (ref 0.61–1.24)
GFR, Estimated: >60 mL/min (ref >60)
Glucose, Bld: 93 mg/dL (ref 70–99)
Potassium: 4.1 mmol/L (ref 3.5–5.1)
Sodium: 135 mmol/L (ref 135–145)
Total Bilirubin: 0.9 mg/dL (ref 0.3–1.2)
Total Protein: 8.4 g/dL — ABNORMAL HIGH (ref 6.5–8.1)

## 2020-01-25 LAB — CBC WITH DIFFERENTIAL/PLATELET
Abs Immature Granulocytes: 0.02 10*3/uL (ref 0.00–0.07)
Basophils Absolute: 0 10*3/uL (ref 0.0–0.1)
Basophils Relative: 1 %
Eosinophils Absolute: 0.1 10*3/uL (ref 0.0–0.5)
Eosinophils Relative: 2 %
HCT: 43.2 % (ref 39.0–52.0)
Hemoglobin: 14.7 g/dL (ref 13.0–17.0)
Immature Granulocytes: 0 %
Lymphocytes Relative: 39 %
Lymphs Abs: 3 10*3/uL (ref 0.7–4.0)
MCH: 31.9 pg (ref 26.0–34.0)
MCHC: 34 g/dL (ref 30.0–36.0)
MCV: 93.7 fL (ref 80.0–100.0)
Monocytes Absolute: 0.7 10*3/uL (ref 0.1–1.0)
Monocytes Relative: 9 %
Neutro Abs: 3.8 10*3/uL (ref 1.7–7.7)
Neutrophils Relative %: 49 %
Platelets: 195 10*3/uL (ref 150–400)
RBC: 4.61 MIL/uL (ref 4.22–5.81)
RDW: 11.9 % (ref 11.5–15.5)
WBC: 7.7 10*3/uL (ref 4.0–10.5)
nRBC: 0 % (ref 0.0–0.2)

## 2020-01-25 LAB — CK: Total CK: 87 U/L (ref 49–397)

## 2020-01-25 LAB — TROPONIN I (HIGH SENSITIVITY): Troponin I (High Sensitivity): 2 ng/L (ref <18)

## 2020-01-25 NOTE — Discharge Instructions (Signed)
You were seen in the emergency department today with discomfort in your chest and abdomen along with muscle aches.  I suspect this is related to your recent RMSF diagnosis and would advise that you complete your course of antibiotics.  You may take Tylenol and/or ibuprofen along with muscle relaxers as prescribed by your PCP for symptoms.  After completing antibiotics your PCP can decide on further course of treatment depending on your response to this.  Please keep your appointment with your cardiologist in the coming month.  Return to the emergency department with any new or suddenly worsening symptoms.

## 2020-01-25 NOTE — ED Triage Notes (Signed)
Pt to the Ed with c/o generalized body aches and pains r/t a dx of  [facility] spotted Fever after being bitten by a tick six months ago.  Pt received the correct diagnoses last week.

## 2020-01-25 NOTE — ED Provider Notes (Signed)
Emergency Department Provider Note   I have reviewed the triage vital signs and the nursing notes.   HISTORY  Chief Complaint Generalized Body Aches   HPI Chase Stewart is a 35 y.o. male with past medical history reviewed below including recent diagnosis of [facility] spotted fever. Patient has had months of headache along with muscle aches and intermittent chest discomfort. He states he seen multiple primary care docs, ED visit, and recent cardiology appointment. 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 He has been started on doxycycline with plan for 21-day course of his completed treatment. He has follow-up with cardiology for an echo later this month and continues to follow with his PCP. He has been compliant with his medications. He states that he presents to the ED today because he is concerned that there could be permanent damage done from this tickborne illness. He developed return of his muscle aches but also developed some epigastric and chest discomfort. The chest discomfort is tightness around the ribs and is similar to what brought him to the cardiologist initially. He denies any syncope or diaphoresis. No vomiting or diarrhea. No rash or fever recently. Has been taking Motrin and muscle relaxer for pain.   Past Medical History:  Diagnosis Date  . Anxiety   . Chronic kidney disease    h/o kidney stones  . Depression   . Migraines     Patient Active Problem List   Diagnosis Date Noted  . 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  . Shoulder pain 08/28/2015    Past Surgical History:  Procedure Laterality Date  . ROTATOR CUFF REPAIR Right   . SHOULDER ARTHROSCOPY WITH OPEN ROTATOR CUFF REPAIR AND DISTAL CLAVICLE ACROMINECTOMY Left 05/26/2015   Procedure: SHOULDER ARTHROSCOPY WITH ANTERIOR AND SUPERIOR LABRAL REPAIR;  Surgeon: Juanell Fairly, MD;  Location: [facility] ORS;   Service: Orthopedics;  Laterality: Left;  . TONSILLECTOMY    . TYMPANOSTOMY TUBE PLACEMENT      Allergies Patient has no known allergies.  Family History  Problem Relation Age of Onset  . Hyperthyroidism Father   . CAD Father     Social History Social History   Tobacco Use  . Smoking status: Never Smoker  . Smokeless tobacco: Never Used  Vaping Use  . Vaping Use: Never used  Substance Use Topics  . Alcohol use: No  . Drug use: Not Currently    Types: Marijuana    Review of Systems  Constitutional: No fever/chills. Positive diffuse muscle aches.  Eyes: No visual changes. ENT: No sore throat. Cardiovascular: Positive chest pain. Respiratory: Denies shortness of breath. Gastrointestinal: Positive epigastric abdominal pain.  No nausea, no vomiting.  No diarrhea.  No constipation. Genitourinary: Negative for dysuria. Musculoskeletal: Positive shoulder/neck pain continuing.  Skin: Negative for rash. Neurological: Negative for focal weakness or numbness. Positive posterior HA.   10-point ROS otherwise negative.  ____________________________________________   PHYSICAL EXAM:  VITAL SIGNS: ED Triage Vitals  Enc Vitals Group     BP 01/25/20 2044 124/89     Pulse Rate 01/25/20 2044 81     Resp 01/25/20 2044 15     Temp 01/25/20 2044 98.1 F (36.7 C)     Temp Source 01/25/20 2044 Oral     SpO2 01/25/20 2041 99 %     Weight 01/25/20 2041 148 lb (67.1 kg)     Height 01/25/20 2041 5\' 10"  (  1.778 m)   Constitutional: Alert and oriented. Well appearing and in no acute distress. Eyes: Conjunctivae are normal.  Head: Atraumatic. Nose: No congestion/rhinnorhea. Mouth/Throat: Mucous membranes are moist.   Neck: No stridor.   Cardiovascular: Normal rate, regular rhythm. Good peripheral circulation. Grossly normal heart sounds.  No murmurs or rubs.  Respiratory: Normal respiratory effort.  No retractions. Lungs CTAB. Gastrointestinal: Soft and nontender. No distention.   Musculoskeletal: No gross deformities of extremities. Neurologic:  Normal speech and language. Skin:  Skin is warm, dry and intact. No rash noted.   ____________________________________________   LABS (all labs ordered are listed, but only abnormal results are displayed)  Labs Reviewed  COMPREHENSIVE METABOLIC PANEL - Abnormal; Notable for the following components:      Result Value   Total Protein 8.4 (*)    All other components within normal limits  CBC WITH DIFFERENTIAL/PLATELET  CK  TROPONIN I (HIGH SENSITIVITY)   ____________________________________________  EKG   EKG Interpretation  Date/Time:  Saturday January 25 2020 21:53:13 EST Ventricular Rate:  60 PR Interval:    QRS Duration: 87 QT Interval:  380 QTC Calculation: 380 R Axis:   74 Text Interpretation: Sinus rhythm RSR' in V1 or V2, probably normal variant Baseline wander in lead(s) V6 No STEMI Confirmed by Alona Bene [phone]) on 01/25/2020 10:07:34 PM       ____________________________________________  RADIOLOGY  DG Chest Portable 1 View  Result Date: 01/25/2020 CLINICAL DATA:  Chest pain. EXAM: PORTABLE CHEST 1 VIEW COMPARISON:  None. FINDINGS: The heart size and mediastinal contours are within normal limits. Both lungs are clear. The visualized skeletal structures are unremarkable. IMPRESSION: No active disease. Electronically Signed   By: Katherine Mantle M.D.   On: 01/25/2020 22:06    ____________________________________________   PROCEDURES  Procedure(s) performed:   Procedures  None  ____________________________________________   INITIAL IMPRESSION / ASSESSMENT AND PLAN / ED COURSE  Pertinent labs & imaging results that were available during my care of the patient were reviewed by me and considered in my medical decision making (see chart for details).   Patient presents to the emergency department with continued body aches and chest discomfort along with epigastric abdominal pain.  Symptoms have been waxing and waning but overall progressive over the last several months and was recently diagnosed with RMSF. No fever or vital sign abnormality to suspect developing sepsis or secondary bacterial infection. No meningitis signs or symptoms. Patient has started doxycycline but only taken several days of medicines. He is currently prescribed a 21-day course and plans to complete this medication. His main concern is that he could be missing something more serious and would like evaluation in the ED. with continued chest pains plan for screening lab work including troponin, EKG, chest x-ray. Will obtain CMP with some epigastric discomfort. Advised patient to continue home medications. Discussed that he will need to follow up closely with PCP and Cardiology as scheduled. Advised that his PCP may decide to refer on to ID specialist depending on response to Doxy course and follow up testing.   10:50 PM  Lab work and chest x-ray reviewed with no acute findings.  EKG is reassuring as interpreted by me as above.  Discussed the test results with the patient along with follow-up plan including PCP and cardiology follow-up.  He plans to continue his antibiotics as prescribed.  Discussed ED return precautions in detail. No new medication today.  ____________________________________________  FINAL CLINICAL IMPRESSION(S) / ED DIAGNOSES  Final diagnoses:  Precordial chest pain  Epigastric pain  Myalgia     Note:  This document was prepared using Dragon voice recognition software and may include unintentional dictation errors.  Alona Bene, MD, [facility] Emergency Medicine    Nils Thor, Arlyss Repress, MD 01/25/20 2255

## 2020-01-28 DIAGNOSIS — M9901 Segmental and somatic dysfunction of cervical region: Secondary | ICD-10-CM | POA: Diagnosis not present

## 2020-01-28 DIAGNOSIS — M62830 Muscle spasm of back: Secondary | ICD-10-CM | POA: Diagnosis not present

## 2020-01-28 DIAGNOSIS — M9902 Segmental and somatic dysfunction of thoracic region: Secondary | ICD-10-CM | POA: Diagnosis not present

## 2020-01-28 DIAGNOSIS — M5414 Radiculopathy, thoracic region: Secondary | ICD-10-CM | POA: Diagnosis not present

## 2020-01-29 ENCOUNTER — Other Ambulatory Visit: Payer: Self-pay

## 2020-01-29 ENCOUNTER — Ambulatory Visit: Payer: Self-pay | Admitting: Adult Medicine

## 2020-01-29 VITALS — BP 123/74 | HR 69 | Temp 98.6°F

## 2020-01-29 DIAGNOSIS — A770 Spotted fever due to Rickettsia rickettsii: Secondary | ICD-10-CM

## 2020-01-29 NOTE — Progress Notes (Signed)
   Subjective:Myalgia    Patient ID: Chase Stewart, male    DOB: 1985-05-17, 35 y.o.   MRN: [id number]  HPI Patient request FMLA for headache, myalgia, and arthralgia  secondary to tick bites 6 months ago. He has none of these symptoms since Rx of last 3wks Patient has seem multiple specialist who have not recommend any acute interventions. Review of Systems    RMSF was pos, denies headache, myalgia  Currently.  Elevated TSH with nl panel noted. Objective:   Physical Exam Constitutional:      General: He is not in acute distress.    Appearance: Normal appearance. He is not ill-appearing.  HENT:     Head: Normocephalic and atraumatic.     Nose: Nose normal.  Eyes:     Extraocular Movements: Extraocular movements intact.     Pupils: Pupils are equal, round, and reactive to light.  Cardiovascular:     Rate and Rhythm: Normal rate.     Heart sounds: Normal heart sounds.  Pulmonary:     Breath sounds: Normal breath sounds.  Abdominal:     General: Abdomen is flat.     Palpations: Abdomen is soft.  Musculoskeletal:        General: No tenderness. Normal range of motion.     Cervical back: Normal range of motion.  Skin:    General: Skin is warm.     Coloration: Skin is not jaundiced.     Findings: No rash.  Neurological:     General: No focal deficit present.     Mental Status: He is alert.      Lyme neg RMSF 1.29 01/21/20  To Rx Tcn 21day Assessment & Plan:RMSF   Discussed with patient rationale for not approving FMLA,  as any extended time clinical uneeded as he agrees and states he currently feels the best he has in 80m Completes rx regimen in 2day  f/u needed in 2wk if symptoms return

## 2020-01-31 DIAGNOSIS — M25512 Pain in left shoulder: Secondary | ICD-10-CM | POA: Diagnosis not present

## 2020-01-31 DIAGNOSIS — M542 Cervicalgia: Secondary | ICD-10-CM | POA: Diagnosis not present

## 2020-02-06 DIAGNOSIS — M5134 Other intervertebral disc degeneration, thoracic region: Secondary | ICD-10-CM | POA: Diagnosis not present

## 2020-02-06 DIAGNOSIS — M5414 Radiculopathy, thoracic region: Secondary | ICD-10-CM | POA: Diagnosis not present

## 2020-02-11 ENCOUNTER — Other Ambulatory Visit: Payer: Self-pay

## 2020-02-11 ENCOUNTER — Encounter: Payer: Self-pay | Admitting: Adult Medicine

## 2020-02-11 ENCOUNTER — Ambulatory Visit: Payer: Self-pay | Admitting: Adult Medicine

## 2020-02-11 VITALS — BP 114/71 | HR 62 | Temp 98.4°F | Resp 12

## 2020-02-11 DIAGNOSIS — A770 Spotted fever due to Rickettsia rickettsii: Secondary | ICD-10-CM

## 2020-02-11 NOTE — Progress Notes (Signed)
02-11-20 [facility] 34y police presents for non exam question regarding his hx rmsf. He was treated with 21d doxycycline He has 2days left on 21d course states he feel the best he Has in 38m. But he want to know if he needs more medication  In light of progress he was explained that there may be no need For further medication and the he should complete his current course and notify the office in 2-3wk if symptoms resurface. He agrees and will follow up as needed

## 2020-02-11 NOTE — Progress Notes (Signed)
Has two days left of the Doxycycline.  Wasn't sure what the next step is.  AMD

## 2020-02-14 DIAGNOSIS — M9901 Segmental and somatic dysfunction of cervical region: Secondary | ICD-10-CM | POA: Diagnosis not present

## 2020-02-14 DIAGNOSIS — M9902 Segmental and somatic dysfunction of thoracic region: Secondary | ICD-10-CM | POA: Diagnosis not present

## 2020-02-14 DIAGNOSIS — M62830 Muscle spasm of back: Secondary | ICD-10-CM | POA: Diagnosis not present

## 2020-02-14 DIAGNOSIS — M5414 Radiculopathy, thoracic region: Secondary | ICD-10-CM | POA: Diagnosis not present

## 2020-02-20 DIAGNOSIS — M9902 Segmental and somatic dysfunction of thoracic region: Secondary | ICD-10-CM | POA: Diagnosis not present

## 2020-02-20 DIAGNOSIS — M62830 Muscle spasm of back: Secondary | ICD-10-CM | POA: Diagnosis not present

## 2020-02-20 DIAGNOSIS — M9901 Segmental and somatic dysfunction of cervical region: Secondary | ICD-10-CM | POA: Diagnosis not present

## 2020-02-20 DIAGNOSIS — M5414 Radiculopathy, thoracic region: Secondary | ICD-10-CM | POA: Diagnosis not present

## 2020-02-21 ENCOUNTER — Encounter: Payer: Self-pay | Admitting: Allergy & Immunology

## 2020-02-21 ENCOUNTER — Other Ambulatory Visit: Payer: Self-pay

## 2020-02-21 ENCOUNTER — Ambulatory Visit (INDEPENDENT_AMBULATORY_CARE_PROVIDER_SITE_OTHER): Payer: 59 | Admitting: Allergy & Immunology

## 2020-02-21 VITALS — BP 98/66 | HR 68 | Temp 98.1°F | Resp 16 | Ht 69.0 in | Wt 151.4 lb

## 2020-02-21 DIAGNOSIS — B999 Unspecified infectious disease: Secondary | ICD-10-CM

## 2020-02-21 DIAGNOSIS — A770 Spotted fever due to Rickettsia rickettsii: Secondary | ICD-10-CM | POA: Diagnosis not present

## 2020-02-21 NOTE — Progress Notes (Signed)
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 RMSF [facility] spotted fever) - We are going to recheck titers to see if you have developed IgG against RMSF, - IgG is a "stronger" antibody and develops after IgM. - I am also going to do an immune screen to see if you have an immunodeficiency. - We will refer you to Infectious Disease since they see this quite a bit more than we do.  - They might be able to give you a better idea on the natural course of the disease.   2. Return if symptoms worsen or fail to improve. We might see you again depending on the labs. We will call you in 1-2 weeks with the results of the testing!   Subjective:   Chase Stewart is a 35 y.o. male presenting today for evaluation of  Chief Complaint  Patient presents with  . Allergy Testing    Spotted fever in Dec. Still having symptoms. Chest, sternum, rib, and back pain. Sporadic. Says more of an ache.    Chase Stewart has a history of the following: Patient Active Problem List   Diagnosis Date Noted  . 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  . Shoulder pain 08/28/2015    History obtained from: chart review and patient.  Chase Stewart was referred by Patient, No Pcp Per.     Chase Stewart is a 35 y.o. male presenting for an evaluation of [place] spotted fever.  He moved back here in May 2021. He was out there walking the line with his Dad. Then he was bit by 6-8 ticks. The whelted and they itched. He did not have any side effects, but he went to see a PCP in [place]. He received doxycycline for a period of time. He was given 7 days and he is unsure if he finished the whole thing.   Then he noticed that around one month later, he was feeling more tired. He does have kids and had moved, so he thought this was what it was  all from. He had some back pain and a headache for one week. He was given a muscle relaxant. He has a CT scan that was negative for a brain bleed.    He did have a rash on his arm. They did not itch or do anything at all. Everytime that he went, he was seeing someone new. So the workup was never fully evaluated. He did have a rash and the headaches for one week. He also reports that he had some pain and pressure on his ribs. He went to see his PCP every two weeks. They were trying to figure out what was going on.   He had labs done around one month ago. He was placed on doxycycline for 21 days. He felt that he was doing better. He finsihed the entire course of doxycycline. He is on prednisone 5mg  four times daily. He is continuing with so that the back MRI can be done. He has been going to PT for his back.  He is seeing Cardiology due to the chest pain. He has had pancic attacks but nothing more than that.   Prior to contracting RMSF, he does not remember having any infectious issues. However, he is concerned with having an immune deficiency given his family history (his  aunt has CVID).      He has no history of allergic rhinitis. He does not have asthma. He is currently working as a Emergency planning/management officer.  Otherwise, there is no history of other atopic diseases, including asthma, food allergies, drug allergies, environmental allergies, stinging insect allergies, eczema, urticaria or contact dermatitis. There is no significant infectious history. Vaccinations are up to date.     Past Medical History: Patient Active Problem List   Diagnosis Date Noted  . 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  . Shoulder pain 08/28/2015    Medication List:  Allergies as of 02/21/2020   No Known Allergies     Medication List       Accurate as of February 21, 2020  1:20 PM. If you have any questions, ask your nurse or doctor.        orphenadrine 100 MG tablet Commonly known as: NORFLEX Take 1 tablet (100 mg total) by  mouth 2 (two) times daily.   predniSONE 5 MG tablet Commonly known as: DELTASONE 12 day taper. Take as directed.       Birth History: non-contributory  Developmental History: non-contributory  Past Surgical History: Past Surgical History:  Procedure Laterality Date  . ROTATOR CUFF REPAIR Right   . SHOULDER ARTHROSCOPY WITH OPEN ROTATOR CUFF REPAIR AND DISTAL CLAVICLE ACROMINECTOMY Left 05/26/2015   Procedure: SHOULDER ARTHROSCOPY WITH ANTERIOR AND SUPERIOR LABRAL REPAIR;  Surgeon: Juanell Fairly, MD;  Location: [facility] ORS;  Service: Orthopedics;  Laterality: Left;  . TONSILLECTOMY    . TYMPANOSTOMY TUBE PLACEMENT       Family History: Family History  Problem Relation Age of Onset  . Hyperthyroidism Father   . CAD Father      Social History: Chase Stewart lives at home with his family.  They live in a house that is also in the 1970s.  There is carpeting throughout the home.  He is under cooling.  There is a dog inside outside of the home.  There are no dust mite covers on the bedding.  There is no tobacco exposure.  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  He is not exposed to fumes, chemicals, or dust.  There is no HEPA filter in the home.   Review of Systems  Constitutional: Positive for malaise/fatigue. Negative for chills, fever and weight loss.  HENT: Negative.  Negative for congestion, ear discharge and ear pain.   Eyes: Negative for pain, discharge and redness.  Respiratory: Negative for cough, sputum production, shortness of breath and wheezing.   Cardiovascular: Negative.  Negative for chest pain and palpitations.  Gastrointestinal: Negative for abdominal pain, constipation, diarrhea, heartburn, nausea and vomiting.  Musculoskeletal: Positive for myalgias.  Skin: Positive for rash. Negative for itching.  Neurological: Negative for dizziness and headaches.  Endo/Heme/Allergies: Negative for environmental allergies. Does not  bruise/bleed easily.       Objective:   Blood pressure 98/66, pulse 68, temperature 98.1 F (36.7 C), resp. rate 16, height 5\' 9"  (1.753 m), weight 151 lb 6.4 oz (68.7 kg), SpO2 98 %. Body mass index is 22.36 kg/m.   Physical Exam:   Physical Exam Constitutional:      Appearance: He is well-developed.     Comments: Cooperative with the exam.  HENT:     Head: Normocephalic and atraumatic.     Right Ear: Tympanic membrane, ear canal and external ear normal. No drainage, swelling or tenderness. Tympanic membrane is not injected, scarred, erythematous, retracted or  bulging.     Left Ear: Tympanic membrane, ear canal and external ear normal. No drainage, swelling or tenderness. Tympanic membrane is not injected, scarred, erythematous, retracted or bulging.     Nose: No nasal deformity, septal deviation, mucosal edema, rhinorrhea or epistaxis.     Right Turbinates: Enlarged and swollen.     Left Turbinates: Enlarged and swollen.     Right Sinus: No maxillary sinus tenderness or frontal sinus tenderness.     Left Sinus: No maxillary sinus tenderness or frontal sinus tenderness.     Mouth/Throat:     Lips: Pink.     Mouth: Oropharynx is clear and moist. Mucous membranes are moist. Mucous membranes are not pale and not dry.     Pharynx: Uvula midline.     Comments: Cobblestoning present in the posterior oropharynx.  Eyes:     General:        Right eye: No discharge.        Left eye: No discharge.     Extraocular Movements: EOM normal.     Conjunctiva/sclera: Conjunctivae normal.     Right eye: Right conjunctiva is not injected. No chemosis.    Left eye: Left conjunctiva is not injected. No chemosis.    Pupils: Pupils are equal, round, and reactive to light.  Cardiovascular:     Rate and Rhythm: Normal rate and regular rhythm.     Heart sounds: Normal heart sounds.  Pulmonary:     Effort: Pulmonary effort is normal. No tachypnea, accessory muscle usage or respiratory distress.      Breath sounds: Normal breath sounds. No wheezing, rhonchi or rales.     Comments: Moving air well in all lung fields. No increased work of breathing noted.  Chest:     Chest wall: No tenderness.  Abdominal:     Tenderness: There is no abdominal tenderness. There is no guarding or rebound.  Lymphadenopathy:     Head:     Right side of head: No submandibular, tonsillar or occipital adenopathy.     Left side of head: No submandibular, tonsillar or occipital adenopathy.     Cervical: No cervical adenopathy.  Skin:    General: Skin is warm.     Capillary Refill: Capillary refill takes less than 2 seconds.     Coloration: Skin is not pale.     Findings: No abrasion, erythema, petechiae or rash. Rash is not papular, urticarial or vesicular.     Comments: No eczematous or urticarial lesions noted.   Neurological:     Mental Status: He is alert.  Psychiatric:        Mood and Affect: Mood and affect normal.        Behavior: Behavior is cooperative.      Diagnostic studies: labs sent instead       Malachi Bonds, MD [facility]

## 2020-02-21 NOTE — Patient Instructions (Addendum)
1. RMSF [facility] spotted fever) - We are going to recheck titers to see if you have developed IgG against RMSF, - IgG is a "stronger" antibody and develops after IgM. - I am also going to do an immune screen to see if you have an immunodeficiency. - We will refer you to Infectious Disease since they see this quite a bit more than we do.  - They might be able to give you a better idea on the natural course of the disease.   2. Return if symptoms worsen or fail to improve. We might see you again depending on the labs. We will call you in 1-2 weeks with the results of the testing!    Please inform us of any Emergency Department visits, hospitalizations, or changes in symptoms. Call us before going to the ED for breathing or allergy symptoms since we might be able to fit you in for a sick visit. Feel free to contact us anytime with any questions, problems, or concerns.  It was a pleasure to meet you today!  Websites that have reliable patient information: 1. American Academy of Asthma, Allergy, and Immunology: www.aaaai.org 2. Food Allergy Research and Education (FARE): foodallergy.org 3. Mothers of Asthmatics: http://www.asthmacommunitynetwork.org 4. American College of Allergy, Asthma, and Immunology: www.acaai.org   COVID-19 Vaccine Information can be found at: PodExchange.nl For questions related to vaccine distribution or appointments, please email [email or web address] or call [phone].   We realize that you might be concerned about having an allergic reaction to the COVID19 vaccines. To help with that concern, WE ARE OFFERING THE COVID19 VACCINES IN OUR OFFICE! Ask the front desk for dates!     "Like" [place] on Facebook and Instagram for our latest updates!       Make sure you are registered to vote! If you have moved or changed any of your contact information, you will need to get this updated before  voting!  In some cases, you MAY be able to register to vote online: [email or web address]

## 2020-02-24 ENCOUNTER — Other Ambulatory Visit: Payer: Self-pay

## 2020-02-24 ENCOUNTER — Ambulatory Visit
Admission: RE | Admit: 2020-02-24 | Discharge: 2020-02-24 | Disposition: A | Discharge status: Home / Self Care | Payer: 59 | Source: Ambulatory Visit | Attending: Family Medicine | Admitting: Family Medicine

## 2020-02-24 ENCOUNTER — Other Ambulatory Visit (HOSPITAL_COMMUNITY): Payer: Self-pay | Admitting: Family Medicine

## 2020-02-24 ENCOUNTER — Other Ambulatory Visit: Payer: Self-pay | Admitting: Family Medicine

## 2020-02-24 ENCOUNTER — Encounter: Payer: Self-pay | Admitting: Allergy & Immunology

## 2020-02-24 DIAGNOSIS — R1012 Left upper quadrant pain: Secondary | ICD-10-CM

## 2020-02-24 DIAGNOSIS — R109 Unspecified abdominal pain: Secondary | ICD-10-CM | POA: Diagnosis not present

## 2020-02-24 DIAGNOSIS — R0789 Other chest pain: Secondary | ICD-10-CM | POA: Diagnosis not present

## 2020-02-25 LAB — ROCKY MTN SPOTTED FVR ABS PNL(IGG+IGM)
RMSF IgG: NEGATIVE
RMSF IgM: 1.08 index — ABNORMAL HIGH (ref 0.00–0.89)

## 2020-02-25 NOTE — Progress Notes (Signed)
Patient is scheduled for 03/06/2020 @ 3:00 With Dr Rutha Bouchard.

## 2020-03-03 DIAGNOSIS — R001 Bradycardia, unspecified: Secondary | ICD-10-CM | POA: Diagnosis not present

## 2020-03-03 DIAGNOSIS — M542 Cervicalgia: Secondary | ICD-10-CM | POA: Diagnosis not present

## 2020-03-03 DIAGNOSIS — M25512 Pain in left shoulder: Secondary | ICD-10-CM | POA: Diagnosis not present

## 2020-03-03 DIAGNOSIS — R0789 Other chest pain: Secondary | ICD-10-CM | POA: Diagnosis not present

## 2020-03-06 ENCOUNTER — Encounter: Payer: Self-pay | Admitting: Internal Medicine

## 2020-03-06 ENCOUNTER — Ambulatory Visit (INDEPENDENT_AMBULATORY_CARE_PROVIDER_SITE_OTHER): Payer: 59 | Admitting: Internal Medicine

## 2020-03-06 ENCOUNTER — Other Ambulatory Visit: Payer: Self-pay

## 2020-03-06 VITALS — BP 113/77 | HR 77 | Temp 97.6°F | Wt 147.0 lb

## 2020-03-06 DIAGNOSIS — R5382 Chronic fatigue, unspecified: Secondary | ICD-10-CM | POA: Diagnosis not present

## 2020-03-06 DIAGNOSIS — R799 Abnormal finding of blood chemistry, unspecified: Secondary | ICD-10-CM | POA: Diagnosis not present

## 2020-03-06 DIAGNOSIS — M5414 Radiculopathy, thoracic region: Secondary | ICD-10-CM | POA: Diagnosis not present

## 2020-03-06 DIAGNOSIS — M9902 Segmental and somatic dysfunction of thoracic region: Secondary | ICD-10-CM | POA: Diagnosis not present

## 2020-03-06 DIAGNOSIS — M62830 Muscle spasm of back: Secondary | ICD-10-CM | POA: Diagnosis not present

## 2020-03-06 DIAGNOSIS — M9901 Segmental and somatic dysfunction of cervical region: Secondary | ICD-10-CM | POA: Diagnosis not present

## 2020-03-06 LAB — DIPHTHERIA / TETANUS ANTIBODY PANEL
Diphtheria Ab: 0.94 IU/mL (ref <0.10)
Tetanus Ab, IgG: 2.72 IU/mL (ref <0.10)

## 2020-03-06 LAB — STREP PNEUMONIAE 23 SEROTYPES IGG
Pneumo Ab Type 1*: 0.7 ug/mL — ABNORMAL LOW (ref >1.3)
Pneumo Ab Type 12 (12F)*: <0.1 ug/mL — ABNORMAL LOW (ref >1.3)
Pneumo Ab Type 14*: 0.8 ug/mL — ABNORMAL LOW (ref >1.3)
Pneumo Ab Type 17 (17F)*: 0.5 ug/mL — ABNORMAL LOW (ref >1.3)
Pneumo Ab Type 19 (19F)*: 0.8 ug/mL — ABNORMAL LOW (ref >1.3)
Pneumo Ab Type 2*: 0.5 ug/mL — ABNORMAL LOW (ref >1.3)
Pneumo Ab Type 20*: 1.4 ug/mL (ref >1.3)
Pneumo Ab Type 22 (22F)*: 0.3 ug/mL — ABNORMAL LOW (ref >1.3)
Pneumo Ab Type 23 (23F)*: 2.1 ug/mL (ref >1.3)
Pneumo Ab Type 26 (6B)*: 0.2 ug/mL — ABNORMAL LOW (ref >1.3)
Pneumo Ab Type 3*: 2.4 ug/mL (ref >1.3)
Pneumo Ab Type 34 (10A)*: 1.8 ug/mL (ref >1.3)
Pneumo Ab Type 4*: 0.2 ug/mL — ABNORMAL LOW (ref >1.3)
Pneumo Ab Type 43 (11A)*: 0.4 ug/mL — ABNORMAL LOW (ref >1.3)
Pneumo Ab Type 5*: 0.6 ug/mL — ABNORMAL LOW (ref >1.3)
Pneumo Ab Type 51 (7F)*: 1 ug/mL — ABNORMAL LOW (ref >1.3)
Pneumo Ab Type 54 (15B)*: 1.3 ug/mL — ABNORMAL LOW (ref >1.3)
Pneumo Ab Type 56 (18C)*: 1.4 ug/mL (ref >1.3)
Pneumo Ab Type 57 (19A)*: 1.6 ug/mL (ref >1.3)
Pneumo Ab Type 68 (9V)*: 1.1 ug/mL — ABNORMAL LOW (ref >1.3)
Pneumo Ab Type 70 (33F)*: 0.3 ug/mL — ABNORMAL LOW (ref >1.3)
Pneumo Ab Type 8*: 0.2 ug/mL — ABNORMAL LOW (ref >1.3)
Pneumo Ab Type 9 (9N)*: 0.4 ug/mL — ABNORMAL LOW (ref >1.3)

## 2020-03-06 LAB — IGG, IGA, IGM
IgA/Immunoglobulin A, Serum: 259 mg/dL (ref 90–386)
IgG (Immunoglobin G), Serum: 1608 mg/dL (ref 603–1613)
IgM (Immunoglobulin M), Srm: 130 mg/dL (ref 20–172)

## 2020-03-06 LAB — CBC WITH DIFFERENTIAL
Basophils Absolute: 0 10*3/uL (ref 0.0–0.2)
Basos: 0 %
EOS (ABSOLUTE): 0 10*3/uL (ref 0.0–0.4)
Eos: 0 %
Hematocrit: 45 % (ref 37.5–51.0)
Hemoglobin: 15.4 g/dL (ref 13.0–17.7)
Immature Grans (Abs): 0.1 10*3/uL (ref 0.0–0.1)
Immature Granulocytes: 1 %
Lymphocytes Absolute: 2.7 10*3/uL (ref 0.7–3.1)
Lymphs: 24 %
MCH: 32.5 pg (ref 26.6–33.0)
MCHC: 34.2 g/dL (ref 31.5–35.7)
MCV: 95 fL (ref 79–97)
Monocytes Absolute: 0.9 10*3/uL (ref 0.1–0.9)
Monocytes: 8 %
Neutrophils Absolute: 7.3 10*3/uL — ABNORMAL HIGH (ref 1.4–7.0)
Neutrophils: 67 %
RBC: 4.74 x10E6/uL (ref 4.14–5.80)
RDW: 12.3 % (ref 11.6–15.4)
WBC: 11 10*3/uL — ABNORMAL HIGH (ref 3.4–10.8)

## 2020-03-06 LAB — COMPLEMENT, TOTAL: Compl, Total (CH50): 44 U/mL (ref >41)

## 2020-03-06 NOTE — Progress Notes (Signed)
        [facility]  Reason for Consult:igm positive rmsf Referring Provider: Dellis Anes    Patient Active Problem List   Diagnosis Date Noted  . 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  . Shoulder pain 08/28/2015      HPI: Chase Stewart is a 35 y.o. male otherwise healthy referred for positive igm rmsf  He had tick bites summer of 2021. He developed a rash on his arms and subsequently fatigue/chest wall pain. Timing of onset and tickbite unclear. He was seeing his pcp who checked rmsf 12/2019 which was positive for igm. He was also referred to rheumatology who gave him prednisone. He was given 21 days course of doxy as well by pcp.  His fatigue had resolved a week into doxy but chest pain still there. He is functional and maintains good appetite wihtout b sx otherwise  He was somehow referred to allergy who repeat his rmsf twice 01/2020 and 02/2020 with persistent igm elevation but not igg. So he was referred to me  No diarrhea, headache. His lft has appeared normal. Lyme serology was negative recently  He lives in [place] and no travel [place] or [place]   Review of Systems: ROS Negative other ros      Past Medical History:  Diagnosis Date  . Anxiety   . Chronic kidney disease    h/o kidney stones  . Depression   . Migraines     Social History   Tobacco Use  . Smoking status: Never Smoker  . Smokeless tobacco: Never Used  Vaping Use  . Vaping Use: Never used  Substance Use Topics  . Alcohol use: No  . Drug use: Not Currently    Types: Marijuana    Family History  Problem Relation Age of Onset  . Hyperthyroidism Father   . CAD Father     No Known Allergies  OBJECTIVE: Vitals:   03/06/20 1452  BP: 113/77  Pulse: 77  Temp: 97.6 F (36.4 C)  TempSrc: Oral  Weight: 147 lb (66.7 kg)   Body mass index is 21.71 kg/m.   Physical Exam Well appearing, conversant, no distress heent normocephalic; per; conj clear; eomi Neck  supple cv rrr no mrg Lungs clear abd s/nt Ext no edema Neuro cn2-12 intact; nonfocal Skin no rash msk no synovitis Psych alert/oriented  Lab: Reviewed   Microbiology:  Serology:  Imaging:   Assessment/plan: Problem List Items Addressed This Visit   None   Visit Diagnoses    Chronic fatigue    -  Primary   Abnormal blood chemistry test          This is a false positive RMSF igm test. Discussed natural history and test evoluation of rmsf. He doesn't seem to have a syndrome suggestive of such. Other tick associated disease like viral could present similarly. I am not sure if he every had an infectious syndrome at all.   -f/u rheum for back pain -f/u id as needed if fever/chill chronic     Follow-up: Return if symptoms worsen or fail to improve.  Raymondo Band, MD [facility] for Infectious Disease [facility] [phone] pager   [phone] cell 03/06/2020, 3:27 PM

## 2020-03-06 NOTE — Patient Instructions (Signed)
You don't have [place] spotted fever. The igM is a false positive test  Continue to follow you rheumatolgist for chronic nonscpecific upper back/chest pain.

## 2020-03-10 DIAGNOSIS — M546 Pain in thoracic spine: Secondary | ICD-10-CM | POA: Diagnosis not present

## 2020-03-11 ENCOUNTER — Other Ambulatory Visit: Payer: Self-pay

## 2020-03-11 ENCOUNTER — Ambulatory Visit (INDEPENDENT_AMBULATORY_CARE_PROVIDER_SITE_OTHER): Payer: 59 | Admitting: Allergy & Immunology

## 2020-03-11 ENCOUNTER — Encounter: Payer: Self-pay | Admitting: Allergy & Immunology

## 2020-03-11 VITALS — BP 110/70 | HR 90 | Temp 98.3°F | Resp 18 | Ht 68.9 in | Wt 148.6 lb

## 2020-03-11 DIAGNOSIS — D72820 Lymphocytosis (symptomatic): Secondary | ICD-10-CM

## 2020-03-11 DIAGNOSIS — M545 Low back pain, unspecified: Secondary | ICD-10-CM

## 2020-03-11 DIAGNOSIS — B999 Unspecified infectious disease: Secondary | ICD-10-CM | POA: Diagnosis not present

## 2020-03-11 DIAGNOSIS — R5383 Other fatigue: Secondary | ICD-10-CM

## 2020-03-11 NOTE — Patient Instructions (Addendum)
1. Recurrent infections with lymphocytosis - We are going to repeat the CBC to make sure that your white blood county is decreasing. - We are also going to get a repeat metabolic panel to make sure that protein is back to normal range.   2. Acute low back pain - We are going to get some autoimmune screening labs. - We will call you in 1-2 weeks with the results of the testing.   3. Follow up in 6 months or earlier if needed.     Please inform us of any Emergency Department visits, hospitalizations, or changes in symptoms. Call us before going to the ED for breathing or allergy symptoms since we might be able to fit you in for a sick visit. Feel free to contact us anytime with any questions, problems, or concerns.  It was a pleasure to see you again today!  Websites that have reliable patient information: 1. American Academy of Asthma, Allergy, and Immunology: www.aaaai.org 2. Food Allergy Research and Education (FARE): foodallergy.org 3. Mothers of Asthmatics: http://www.asthmacommunitynetwork.org 4. American College of Allergy, Asthma, and Immunology: www.acaai.org   COVID-19 Vaccine Information can be found at: [email or web address].[email or web address] For questions related to vaccine distribution or appointments, please email [email or web address] or call [phone].   We realize that you might be concerned about having an allergic reaction to the COVID19 vaccines. To help with that concern, WE ARE OFFERING THE COVID19 VACCINES IN OUR OFFICE! Ask the front desk for dates!     "Like" [place] on Facebook and Instagram for our latest updates!      A healthy democracy works best when [employer] participate! Make sure you are registered to vote! If you have moved or changed any of your contact information, you will need to get this updated before voting!  In some cases, you MAY be able to register to vote online:  [email or web address]

## 2020-03-11 NOTE — Progress Notes (Signed)
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 Recurrent infections with lymphocytosis - We are going to repeat the CBC to make sure that your white blood county is decreasing. - We are also going to get a repeat metabolic panel to make sure that protein is back to normal range.   2. Acute low back pain - We are going to get some autoimmune screening labs. - We will call you in 1-2 weeks with the results of the testing.   3. Follow up in 6 months or earlier if needed.    Subjective:   Chase Stewart is a 35 y.o. male presenting today for follow up of  Chief Complaint  Patient presents with  . Pain    Chase Stewart has a history of the following: Patient Active Problem List   Diagnosis Date Noted  . 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  . Shoulder pain 08/28/2015    History obtained from: chart review and patient.  Chase Stewart is a 35 y.o. male presenting for a follow up visit.  I last saw him in February 2022 for evaluation of [facility] spotted fever.  He was also having chronic back pain as well as a rash and fatigue with this.  He has a history of common variable immune deficiency in his family, therefore we obtained an immune screen which was largely normal.  We also obtained streptococcal titers which were nonprotective.  Recommended that he get a Pneumovax and repeat titers for 6 weeks.  We did get repeat RMSF titers and this showed a continued IgM with no IgG.  We did refer him to infectious disease for further management.  He did go to see Dr. Renold Don with ID and was told that he probably never had RMSF.  They felt this was just a false positive.  He did not recommend any other further work-up.  He evidently went to see an orthopedist yesterday regarding his back pain.  He is  not sure what they are going to do about that.  Mentions rheumatology today as well, but I do not see a referral in the system.  He does report that he has a history of panic attacks. He is trying to figure out what is going on. He is looking at changing primary care providers. 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  However, no physicians seem to stick around.  He is now looking to establish care locally here in [place].  He remains quite worried about his clinical situation.  He said that he is having marked fatigue.  This chronic back pain is also bothering him as well.  He has never had any kind of autoimmune work-up, at least according to him.    Otherwise, there have been no changes to his past medical history, surgical history, family history, or social history.    Review of Systems  Constitutional: Positive for malaise/fatigue. Negative for chills, fever and weight loss.  HENT: Negative for congestion, ear discharge, ear pain and sinus pain.   Eyes: Negative for blurred vision, double vision, pain, discharge and redness.  Respiratory: Negative for cough, sputum production, shortness of breath, wheezing and stridor.   Cardiovascular: Negative.  Negative for chest pain and palpitations.  Gastrointestinal: Negative for  abdominal pain, constipation, diarrhea, heartburn, nausea and vomiting.  Skin: Positive for rash. Negative for itching.  Neurological: Negative for dizziness and headaches.  Endo/Heme/Allergies: Negative for environmental allergies. Does not bruise/bleed easily.       Objective:   Blood pressure 110/70, pulse 90, temperature 98.3 F (36.8 C), temperature source Temporal, resp. rate 18, height 5' 8.9" (1.75 m), weight 148 lb 9.6 oz (67.4 kg), SpO2 100 %. Body mass index is 22.01 kg/m.   Physical Exam:  Physical Exam Constitutional:      Appearance: He is well-developed.  HENT:     Head:  Normocephalic and atraumatic.     Right Ear: Tympanic membrane, ear canal and external ear normal.     Left Ear: Tympanic membrane, ear canal and external ear normal.     Nose: No nasal deformity, septal deviation, mucosal edema, rhinorrhea or epistaxis.     Right Turbinates: Enlarged and swollen.     Left Turbinates: Enlarged and swollen.     Right Sinus: No maxillary sinus tenderness or frontal sinus tenderness.     Left Sinus: No maxillary sinus tenderness or frontal sinus tenderness.     Mouth/Throat:     Lips: Pink.     Mouth: Oropharynx is clear and moist. Mucous membranes are moist. Mucous membranes are not pale.     Pharynx: Uvula midline.  Eyes:     General:        Right eye: No discharge.        Left eye: No discharge.     Extraocular Movements: EOM normal.     Conjunctiva/sclera: Conjunctivae normal.     Right eye: Right conjunctiva is not injected. No chemosis.    Left eye: Left conjunctiva is not injected. No chemosis.    Pupils: Pupils are equal, round, and reactive to light.  Cardiovascular:     Rate and Rhythm: Normal rate and regular rhythm.     Heart sounds: Normal heart sounds.  Pulmonary:     Effort: Pulmonary effort is normal. No tachypnea, accessory muscle usage or respiratory distress.     Breath sounds: Normal breath sounds. No wheezing, rhonchi or rales.     Comments: Moving air well in all lung fields. No increased work of breathing noted.  Chest:     Chest wall: No tenderness.  Lymphadenopathy:     Cervical: No cervical adenopathy.  Skin:    General: Skin is warm.     Capillary Refill: Capillary refill takes less than 2 seconds.     Coloration: Skin is not pale.     Findings: No abrasion, erythema, petechiae or rash. Rash is not papular, urticarial or vesicular.     Comments: No eczematous or urticarial lesions noted.   Neurological:     Mental Status: He is alert.  Psychiatric:        Mood and Affect: Mood and affect normal.        Behavior:  Behavior is cooperative.      Diagnostic studies: none      Malachi Bonds, MD  [facility]

## 2020-03-13 ENCOUNTER — Encounter: Payer: Self-pay | Admitting: Allergy & Immunology

## 2020-03-13 ENCOUNTER — Emergency Department (HOSPITAL_COMMUNITY): Payer: 59

## 2020-03-13 ENCOUNTER — Other Ambulatory Visit: Payer: Self-pay

## 2020-03-13 ENCOUNTER — Encounter (HOSPITAL_COMMUNITY): Payer: Self-pay | Admitting: Emergency Medicine

## 2020-03-13 ENCOUNTER — Emergency Department (HOSPITAL_COMMUNITY)
Admission: EM | Admit: 2020-03-13 | Discharge: 2020-03-13 | Disposition: A | Discharge status: Home / Self Care | Payer: 59 | Attending: Emergency Medicine | Admitting: Emergency Medicine

## 2020-03-13 DIAGNOSIS — M546 Pain in thoracic spine: Secondary | ICD-10-CM | POA: Diagnosis not present

## 2020-03-13 DIAGNOSIS — R911 Solitary pulmonary nodule: Secondary | ICD-10-CM | POA: Diagnosis not present

## 2020-03-13 DIAGNOSIS — R001 Bradycardia, unspecified: Secondary | ICD-10-CM | POA: Diagnosis not present

## 2020-03-13 DIAGNOSIS — N189 Chronic kidney disease, unspecified: Secondary | ICD-10-CM | POA: Diagnosis not present

## 2020-03-13 DIAGNOSIS — D7389 Other diseases of spleen: Secondary | ICD-10-CM | POA: Diagnosis not present

## 2020-03-13 DIAGNOSIS — R918 Other nonspecific abnormal finding of lung field: Secondary | ICD-10-CM | POA: Diagnosis not present

## 2020-03-13 DIAGNOSIS — R079 Chest pain, unspecified: Secondary | ICD-10-CM | POA: Diagnosis not present

## 2020-03-13 MED ORDER — METHOCARBAMOL 750 MG PO TABS: 750.0000 mg | ORAL_TABLET | Freq: Three times a day (TID) | ORAL | 0 refills | Status: DC | PRN

## 2020-03-13 MED ORDER — IOHEXOL 300 MG/ML  SOLN
75.0000 mL | Freq: Once | INTRAMUSCULAR | Status: AC | PRN
  Administered 2020-03-13: 75 mL via INTRAVENOUS

## 2020-03-13 MED ORDER — ACETAMINOPHEN 500 MG PO TABS
1000.0000 mg | ORAL_TABLET | Freq: Once | ORAL | Status: AC
  Administered 2020-03-13: 1000 mg via ORAL
  Filled 2020-03-13: qty 2

## 2020-03-13 NOTE — Discharge Instructions (Addendum)
It was our pleasure to provide your ER care today - we hope that you feel better.  Your xrays look good/normal.   You may try acetaminophen or ibuprofen as need for pain. You may also take robaxin as need for muscle pain/spasm - no driving when taking.   Your ct scan showed no acute process (I.e. no cause of symptoms/pain), a couple incidental findings were noted (see below), discuss with primary care doctor at follow up and have them get follow up imaging in 6-12 months.    IMPRESSION: No acute intrathoracic abnormalities. Tiny subpleural nodule R lower lobe 4 mm diameter, nonspecific; no follow-up needed if patient is low-risk. Non contrast chest CT can be considered in 12 months if patient is high-risk. Enhancing lesion at the spleen question small hemangioma 11 mm diameter.   Follow up with primary care doctor in the next 1-2 weeks.   Return to ER if worse, new symptoms, fevers, severe/intractable pain, chest pain, trouble breathing, or other concern.

## 2020-03-13 NOTE — ED Triage Notes (Signed)
C/o back pain for last 3 months, seen by previous facilities but all test were negative.  Pain started 2 radiating on back ribs am and about 7 am.  Denies injury.

## 2020-03-13 NOTE — ED Notes (Signed)
Aware awaiting ct. Nad. No changes. Pt on cell phone

## 2020-03-13 NOTE — ED Notes (Signed)
Pt dc with nad.

## 2020-03-13 NOTE — ED Notes (Signed)
Pt wanted to speak with EDP prior to dc. Dr Denton Lank aware

## 2020-03-13 NOTE — ED Notes (Signed)
Pt wanted to speak with edp again. Dr Denton Lank aware

## 2020-03-13 NOTE — ED Provider Notes (Addendum)
[facility] EMERGENCY DEPARTMENT Provider Note   CSN: [id number] Arrival date & time: 03/13/20  0754     History Chief Complaint  Patient presents with  . Back Pain    Chase Stewart is a 35 y.o. male.  Patient c/o back pain in past couple months, but worse in last day. States mid to upper back, dull pain, constant, moderate, with periods of brief exacerbation of pain. No radicular pain. No numbness/weakness. Denies recent injury or strain. Has not taken any meds for same today. On one occasion last night, pain radiated towards front/chest. Denies exertional chest pain or discomfort. No associated sob, nv or diaphoresis. No pleuritic pain. Denies cough or uri symptoms. No fever or chills. No leg pain or swelling. No wt loss. No fevers. Indicates in past months, multiple evals by his doctor, cardiology, ID, ED. States was recently told prior RMSF test false positive and that likely never had RMSF.   The history is provided by the patient.  Back Pain Associated symptoms: no abdominal pain, no fever, no headaches, no numbness and no weakness        Past Medical History:  Diagnosis Date  . Anxiety   . Chronic kidney disease    h/o kidney stones  . Depression   . Migraines     Patient Active Problem List   Diagnosis Date Noted  . 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  . Shoulder pain 08/28/2015    Past Surgical History:  Procedure Laterality Date  . ROTATOR CUFF REPAIR Right   . SHOULDER ARTHROSCOPY WITH OPEN ROTATOR CUFF REPAIR AND DISTAL CLAVICLE ACROMINECTOMY Left 05/26/2015   Procedure: SHOULDER ARTHROSCOPY WITH ANTERIOR AND SUPERIOR LABRAL REPAIR;  Surgeon: Juanell Fairly, MD;  Location: [facility] ORS;  Service: Orthopedics;  Laterality: Left;  . TONSILLECTOMY    . TYMPANOSTOMY TUBE PLACEMENT         Family History  Problem Relation Age of Onset  . Hyperthyroidism Father   . CAD Father     Social History   Tobacco Use  . Smoking status: Never Smoker  . Smokeless tobacco:  Never Used  Vaping Use  . Vaping Use: Never used  Substance Use Topics  . Alcohol use: No  . Drug use: Not Currently    Types: Marijuana    Home Medications Prior to Admission medications   Medication Sig Start Date End Date Taking? Authorizing Provider  orphenadrine (NORFLEX) 100 MG tablet Take 1 tablet (100 mg total) by mouth 2 (two) times daily. Patient not taking: Reported on 02/11/2020 01/21/20   Joni Reining, PA-C  predniSONE (DELTASONE) 5 MG tablet 12 day taper. Take as directed. Patient not taking: Reported on 03/06/2020 02/14/20   [provider]    Allergies    Patient has no known allergies.  Review of Systems   Review of Systems  Constitutional: Negative for chills and fever.  HENT: Negative for sore throat.   Eyes: Negative for redness.  Respiratory: Negative for cough and shortness of breath.   Cardiovascular: Negative for leg swelling.  Gastrointestinal: Negative for abdominal pain and vomiting.  Genitourinary: Negative for flank pain.  Musculoskeletal: Positive for back pain. Negative for neck pain.  Skin: Negative for rash.  Neurological: Negative for weakness, numbness and headaches.  Hematological: Does not bruise/bleed easily.  Psychiatric/Behavioral: Negative for confusion.    Physical Exam Updated Vital Signs BP 138/86 (BP Location: Left Arm)   Temp 98.2 F (36.8 C) (Oral)   Resp 16   Ht 1.753 m (5'  9")   Wt 66.7 kg   SpO2 100%   BMI 21.71 kg/m   Physical Exam Vitals and nursing note reviewed.  Constitutional:      Appearance: Normal appearance. He is well-developed.  HENT:     Head: Atraumatic.     Nose: Nose normal.     Mouth/Throat:     Mouth: Mucous membranes are moist.     Pharynx: Oropharynx is clear.  Eyes:     General: No scleral icterus.    Conjunctiva/sclera: Conjunctivae normal.  Neck:     Trachea: No tracheal deviation.  Cardiovascular:     Rate and Rhythm: Normal rate and regular rhythm.     Pulses: Normal  pulses.     Heart sounds: Normal heart sounds. No murmur heard. No friction rub. No gallop.   Pulmonary:     Effort: Pulmonary effort is normal. No accessory muscle usage or respiratory distress.     Breath sounds: Normal breath sounds.  Abdominal:     General: Bowel sounds are normal. There is no distension.     Palpations: Abdomen is soft.     Tenderness: There is no abdominal tenderness. There is no guarding.  Genitourinary:    Comments: No cva tenderness. Musculoskeletal:        General: No swelling or tenderness.     Cervical back: Normal range of motion and neck supple. No rigidity.     Right lower leg: No edema.     Left lower leg: No edema.     Comments: CTLS spine, non tender, aligned, no step off. No swelling, skin changes, or redness to back and/or in area of pain (mid thoracic spine region).   Skin:    General: Skin is warm and dry.     Findings: No rash.  Neurological:     Mental Status: He is alert.     Comments: Alert, speech clear. Motor/sens grossly intact. Steady gait.   Psychiatric:        Mood and Affect: Mood normal.     ED Results / Procedures / Treatments   Labs (all labs ordered are listed, but only abnormal results are displayed) Labs Reviewed - No data to display  EKG None  Radiology DG Thoracic Spine 2 View  Result Date: 03/13/2020 CLINICAL DATA:  Upper back pain. EXAM: THORACIC SPINE 2 VIEWS COMPARISON:  None. FINDINGS: There is no evidence of thoracic spine fracture. Alignment is normal. No other significant bone abnormalities are identified. IMPRESSION: Negative. Electronically Signed   By: Lupita Raider M.D.   On: 03/13/2020 09:47   CT Chest W Contrast  Result Date: 03/13/2020 CLINICAL DATA:  Chest pain, miss of breath, upper back pain for 3 months, previous outside workup negative, pain radiating to posterior ribs this morning at 0700 hours EXAM: CT CHEST WITH CONTRAST TECHNIQUE: Multidetector CT imaging of the chest was performed during  intravenous contrast administration. Sagittal and coronal MPR images reconstructed from axial data set. CONTRAST:  60mL OMNIPAQUE IOHEXOL 300 MG/ML  SOLN IV COMPARISON:  Chest radiograph 01/25/2020 FINDINGS: Cardiovascular: Aorta normal caliber. Vascular structures patent. Heart unremarkable. No pericardial effusion. Mediastinum/Nodes: Base of cervical region normal appearance. Esophagus unremarkable. No thoracic adenopathy. Lungs/Pleura: Tiny subpleural nodule RIGHT lower lobe 4 mm image 95. Upper Abdomen: Enhancing lesion at the spleen question small hemangioma 11 mm diameter. Remaining visualized upper abdomen unremarkable. Musculoskeletal: Unremarkable IMPRESSION: No acute intrathoracic abnormalities. Tiny subpleural nodule RIGHT lower lobe 4 mm diameter, nonspecific; no follow-up needed if patient  is low-risk. Non-contrast chest CT can be considered in 12 months if patient is high-risk. This recommendation follows the consensus statement: Guidelines for Management of Incidental Pulmonary Nodules Detected on CT Images: From the Fleischner Society 2017; Radiology 2017; 284:228-243. Enhancing lesion at the spleen question small hemangioma 11 mm diameter. Electronically Signed   By: Ulyses Southward M.D.   On: 03/13/2020 13:32    Procedures Procedures   Medications Ordered in ED Medications - No data to display  ED Course  I have reviewed the triage vital signs and the nursing notes.  Pertinent labs & imaging results that were available during my care of the patient were reviewed by me and considered in my medical decision making (see chart for details).    MDM Rules/Calculators/A&P                         Imaging ordered.   Reviewed nursing notes and prior charts for additional history.   No meds pta today. Acetaminophen po (pt drove self).  Xrays reviewed/interpreted by me - no fx or acute changes noted.   Rec ibuprofen or acetaminophen, robaxin prn. pcp f/u.  Return precautions provided.    At d/c pt requests CT/thoracic CT be done. States he has been worried about symptoms for months and request ct imaging.   CT neg for acute process. Incidental findings noted - pcp f/u.        Final Clinical Impression(s) / ED Diagnoses Final diagnoses:  None    Rx / DC Orders ED Discharge Orders    None         Cathren Laine, MD 03/13/20 1343

## 2020-03-16 LAB — ALLERGENS W/COMP RFLX AREA 2
Alternaria Alternata IgE: <0.1 kU/L
Aspergillus Fumigatus IgE: 0.13 kU/L — AB
Bermuda Grass IgE: <0.1 kU/L
Cedar, Mountain IgE: <0.1 kU/L
Cladosporium Herbarum IgE: <0.1 kU/L
Cockroach, German IgE: <0.1 kU/L
Common Silver Birch IgE: <0.1 kU/L
Cottonwood IgE: <0.1 kU/L
D Farinae IgE: 0.26 kU/L — AB
D Pteronyssinus IgE: 0.54 kU/L — AB
E001-IgE Cat Dander: <0.1 kU/L
E005-IgE Dog Dander: <0.1 kU/L
Elm, American IgE: <0.1 kU/L
IgE (Immunoglobulin E), Serum: 83 IU/mL (ref 6–495)
Johnson Grass IgE: <0.1 kU/L
Maple/Box Elder IgE: <0.1 kU/L
Mouse Urine IgE: <0.1 kU/L
Oak, White IgE: <0.1 kU/L
Pecan, Hickory IgE: <0.1 kU/L
Penicillium Chrysogen IgE: 0.5 kU/L — AB
Pigweed, Rough IgE: <0.1 kU/L
Ragweed, Short IgE: <0.1 kU/L
Sheep Sorrel IgE Qn: <0.1 kU/L
Timothy Grass IgE: <0.1 kU/L
White Mulberry IgE: <0.1 kU/L

## 2020-03-16 LAB — LACTATE DEHYDROGENASE: LDH: 235 IU/L — ABNORMAL HIGH (ref 121–224)

## 2020-03-16 LAB — CBC WITH DIFFERENTIAL
Basophils Absolute: 0.1 10*3/uL (ref 0.0–0.2)
Basos: 1 %
EOS (ABSOLUTE): 0 10*3/uL (ref 0.0–0.4)
Eos: 0 %
Hematocrit: 42.4 % (ref 37.5–51.0)
Hemoglobin: 14.7 g/dL (ref 13.0–17.7)
Immature Grans (Abs): 0 10*3/uL (ref 0.0–0.1)
Immature Granulocytes: 0 %
Lymphocytes Absolute: 3.1 10*3/uL (ref 0.7–3.1)
Lymphs: 49 %
MCH: 31.9 pg (ref 26.6–33.0)
MCHC: 34.7 g/dL (ref 31.5–35.7)
MCV: 92 fL (ref 79–97)
Monocytes Absolute: 0.5 10*3/uL (ref 0.1–0.9)
Monocytes: 8 %
Neutrophils Absolute: 2.7 10*3/uL (ref 1.4–7.0)
Neutrophils: 42 %
RBC: 4.61 x10E6/uL (ref 4.14–5.80)
RDW: 12.1 % (ref 11.6–15.4)
WBC: 6.3 10*3/uL (ref 3.4–10.8)

## 2020-03-16 LAB — C-REACTIVE PROTEIN: CRP: 4 mg/L (ref 0–10)

## 2020-03-16 LAB — CMP14+EGFR
ALT: 51 IU/L — ABNORMAL HIGH (ref 0–44)
AST: 44 IU/L — ABNORMAL HIGH (ref 0–40)
Albumin/Globulin Ratio: 1.6 (ref 1.2–2.2)
Albumin: 4.7 g/dL (ref 4.0–5.0)
Alkaline Phosphatase: 84 IU/L (ref 44–121)
BUN/Creatinine Ratio: 13 (ref 9–20)
BUN: 13 mg/dL (ref 6–20)
Bilirubin Total: 0.8 mg/dL (ref 0.0–1.2)
CO2: 22 mmol/L (ref 20–29)
Calcium: 9.2 mg/dL (ref 8.7–10.2)
Chloride: 103 mmol/L (ref 96–106)
Creatinine, Ser: 1.02 mg/dL (ref 0.76–1.27)
Globulin, Total: 2.9 g/dL (ref 1.5–4.5)
Glucose: 110 mg/dL — ABNORMAL HIGH (ref 65–99)
Potassium: 4.5 mmol/L (ref 3.5–5.2)
Sodium: 143 mmol/L (ref 134–144)
Total Protein: 7.6 g/dL (ref 6.0–8.5)
eGFR: 100 mL/min/{1.73_m2} (ref >59)

## 2020-03-16 LAB — ANTINUCLEAR ANTIBODIES, IFA: ANA Titer 1: NEGATIVE

## 2020-03-16 LAB — SEDIMENTATION RATE: Sed Rate: 3 mm/hr (ref 0–15)

## 2020-03-16 LAB — RHEUMATOID FACTOR: Rheumatoid fact SerPl-aCnc: <10 IU/mL (ref <14.0)

## 2020-03-18 DIAGNOSIS — M546 Pain in thoracic spine: Secondary | ICD-10-CM | POA: Diagnosis not present

## 2020-03-19 ENCOUNTER — Encounter (INDEPENDENT_AMBULATORY_CARE_PROVIDER_SITE_OTHER): Payer: Self-pay | Admitting: Nurse Practitioner

## 2020-03-19 ENCOUNTER — Telehealth (INDEPENDENT_AMBULATORY_CARE_PROVIDER_SITE_OTHER): Payer: Self-pay

## 2020-03-19 ENCOUNTER — Other Ambulatory Visit: Payer: Self-pay

## 2020-03-19 ENCOUNTER — Ambulatory Visit (INDEPENDENT_AMBULATORY_CARE_PROVIDER_SITE_OTHER): Payer: 59 | Admitting: Nurse Practitioner

## 2020-03-19 VITALS — BP 112/74 | HR 69 | Temp 97.5°F | Ht 68.5 in | Wt 145.6 lb

## 2020-03-19 DIAGNOSIS — R748 Abnormal levels of other serum enzymes: Secondary | ICD-10-CM

## 2020-03-19 DIAGNOSIS — R69 Illness, unspecified: Secondary | ICD-10-CM | POA: Diagnosis not present

## 2020-03-19 DIAGNOSIS — S233XXA Sprain of ligaments of thoracic spine, initial encounter: Secondary | ICD-10-CM | POA: Diagnosis not present

## 2020-03-19 DIAGNOSIS — F419 Anxiety disorder, unspecified: Secondary | ICD-10-CM | POA: Diagnosis not present

## 2020-03-19 DIAGNOSIS — G8929 Other chronic pain: Secondary | ICD-10-CM

## 2020-03-19 DIAGNOSIS — M546 Pain in thoracic spine: Secondary | ICD-10-CM

## 2020-03-19 DIAGNOSIS — M47814 Spondylosis without myelopathy or radiculopathy, thoracic region: Secondary | ICD-10-CM | POA: Diagnosis not present

## 2020-03-19 NOTE — Progress Notes (Signed)
Subjective:  Patient ID: Chase Stewart, male    DOB: 26-Apr-1985  Age: 35 y.o. MRN: [id number]  CC:  Chief Complaint  Patient presents with  . Establish Care    Had an MRI on back yesterday at [facility], has had sever pain in back and neck for several months and seen several doctors      HPI  This patient arrives today for the above.  He arrives today to establish care at this office.  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  His main complaint is he is having upper back pain that will radiate sometimes to his neck and around his rib cage to his chest.  This all started back in August or September 2021.  He has had extensive work-up and has seen orthopedics which she continues to follow with.  He does have MRI done yesterday which showed small disc protrusions without significant impingement on his spinal cord.  He has undergone physical therapy and is taking meloxicam daily for the last week to week and a half.  He has not noticed much improvement with the meloxicam but did report some mild short-term improvement with the pain working with physical therapy.  He has undergone additional work-up for his pain with cardiology, allergist, infectious disease, and endocrinology.  So far work-up has been mainly benign.  There was a mildly elevated TSH level, he tells me cardiology ruled out any coronary artery disease, allergist did look into autoimmune titers which were negative.  He did have mildly elevated white blood cell count of 11 on one recent CBC which did make him concerned for possibility of blood cancers.  CBC that was done a few days ago was within normal limits.  At one point he was also tested for Lyme and [facility] spotted fever which he did have an elevation in IgM antibodies for [facility] spotted fever, he was treated with doxycycline, but IgM antibodies for [facility] spotted fever continue to be elevated.  He did see infectious disease for this and they  determined that it was most likely a false negative elevation.  His main concern is that he feels he does not have answers to the reasons for his symptoms and this is scaring him because he is fearful that he has a severe diagnosis that just has not been found yet.  He does deny any sensation changes or weakness to his arms or legs.  He is scheduled to follow-up with orthopedics later today to discuss MRI results.  Past Medical History:  Diagnosis Date  . Anxiety   . Chronic kidney disease    h/o kidney stones  . Depression   . Migraines       Family History  Problem Relation Age of Onset  . Hyperthyroidism Father   . CAD Father     Social History   Social History Narrative  . Not on file   Social History   Tobacco Use  . Smoking status: Never Smoker  . Smokeless tobacco: Never Used  Substance Use Topics  . Alcohol use: No     Current Meds  Medication Sig  . meloxicam (MOBIC) 15 MG tablet Take 15 mg by mouth daily.    ROS:  Review of Systems  Constitutional: Negative for fever and weight loss.  Eyes: Negative for blurred vision.  Respiratory: Negative for shortness of breath.   Cardiovascular: Negative for chest pain.  Gastrointestinal: Negative for abdominal pain and  blood in stool.  Neurological: Positive for headaches. Negative for dizziness, sensory change and weakness.  Psychiatric/Behavioral: Negative for suicidal ideas.     Objective:   Today's Vitals: BP 112/74   Pulse 69   Temp (!) 97.5 F (36.4 C) (Temporal)   Ht 5' 8.5" (1.74 m)   Wt 145 lb 9.6 oz (66 kg)   SpO2 99%   BMI 21.82 kg/m  Vitals with BMI 03/19/2020 03/13/2020 03/13/2020  Height 5' 8.5" - -  Weight 145 lbs 10 oz - -  BMI 21.81 - -  Systolic 112 111 -  Diastolic 74 63 -  Pulse 69 57 71     Physical Exam Vitals reviewed.  Constitutional:      Appearance: Normal appearance.  HENT:     Head: Normocephalic and atraumatic.  Cardiovascular:     Rate and Rhythm: Normal rate and  regular rhythm.  Pulmonary:     Effort: Pulmonary effort is normal.     Breath sounds: Normal breath sounds.  Chest:  Breasts:     Right: No axillary adenopathy or supraclavicular adenopathy.     Left: No axillary adenopathy or supraclavicular adenopathy.    Musculoskeletal:     Cervical back: Neck supple.  Lymphadenopathy:     Head:     Right side of head: No submental, submandibular, preauricular, posterior auricular or occipital adenopathy.     Left side of head: No submental, submandibular, preauricular, posterior auricular or occipital adenopathy.     Cervical: No cervical adenopathy.     Upper Body:     Right upper body: No supraclavicular or axillary adenopathy.     Left upper body: No supraclavicular or axillary adenopathy.     Lower Body: No right inguinal adenopathy. No left inguinal adenopathy.  Skin:    General: Skin is warm and dry.  Neurological:     Mental Status: He is alert and oriented to person, place, and time.  Psychiatric:        Mood and Affect: Mood normal.        Behavior: Behavior normal.        Thought Content: Thought content normal.        Judgment: Judgment normal.          Assessment and Plan   1. Chronic bilateral thoracic back pain   2. Elevated liver enzymes   3. Anxiety      Plan: 1.  In my opinion most likely etiology is the disc protrusion seen on MRI.  I recommend that he follow-up with orthopedics later today to determine a treatment plan. 2.  I did note per chart review that he did have mildly elevated liver enzymes on last metabolic panel which was collected approximately week ago.  We will plan on rechecking this when he follows up in about a month for his annual physical exam.  He denies drinking alcohol. 3.  We did discuss his mood in detail at the office visit he does feel that he is having some anxiety related to not having a definitive diagnosis yet for his pain.  He has been on an antidepressant (fluoxetine) in the past  but did not tolerate it very well and ended up stopping it.  He is open to considering medication if needed, but does feel that his anxiety is very well associated with his current situation and I am agreeable with this.  He does participate in counseling sessions that he receives through his work and I encouraged him  to continue doing so.   Tests ordered No orders of the defined types were placed in this encounter.     No orders of the defined types were placed in this encounter.   Patient to follow-up in 1 month or sooner as needed for annual physical exam.  Elenore Paddy, NP

## 2020-03-19 NOTE — Telephone Encounter (Signed)
Patient called and stated that he did discuss taking an anxiety medication with his wife and stated that he does want to go on something and [facility] on [address] is good to send this to. Please let me know if I need to relay any message to him and I will call him back and also let him know that something was sent to his pharmacy. Thank you.

## 2020-03-19 NOTE — Patient Instructions (Signed)
Vitamin D3 - 1,000IUs by mouth daily

## 2020-03-19 NOTE — Telephone Encounter (Signed)
Called patient and scheduled him for a MyChart visit on 03/30/2020 at 10:15am. Patient verbalized an understanding.

## 2020-03-19 NOTE — Telephone Encounter (Signed)
I think I would like to discuss this with him in order to choose the most appropriate agent. Thus, can you get him scheduled for a tele visit with me when I come back to the office?

## 2020-03-23 DIAGNOSIS — M791 Myalgia, unspecified site: Secondary | ICD-10-CM | POA: Diagnosis not present

## 2020-03-24 DIAGNOSIS — M5134 Other intervertebral disc degeneration, thoracic region: Secondary | ICD-10-CM | POA: Diagnosis not present

## 2020-03-24 DIAGNOSIS — R0781 Pleurodynia: Secondary | ICD-10-CM | POA: Diagnosis not present

## 2020-03-24 DIAGNOSIS — M5414 Radiculopathy, thoracic region: Secondary | ICD-10-CM | POA: Diagnosis not present

## 2020-03-30 ENCOUNTER — Telehealth (INDEPENDENT_AMBULATORY_CARE_PROVIDER_SITE_OTHER): Payer: 59 | Admitting: Nurse Practitioner

## 2020-03-30 ENCOUNTER — Encounter (INDEPENDENT_AMBULATORY_CARE_PROVIDER_SITE_OTHER): Payer: Self-pay | Admitting: Nurse Practitioner

## 2020-03-30 VITALS — Ht 68.5 in | Wt 145.0 lb

## 2020-03-30 DIAGNOSIS — M546 Pain in thoracic spine: Secondary | ICD-10-CM

## 2020-03-30 DIAGNOSIS — F419 Anxiety disorder, unspecified: Secondary | ICD-10-CM | POA: Diagnosis not present

## 2020-03-30 DIAGNOSIS — R52 Pain, unspecified: Secondary | ICD-10-CM

## 2020-03-30 DIAGNOSIS — G8929 Other chronic pain: Secondary | ICD-10-CM

## 2020-03-30 DIAGNOSIS — R69 Illness, unspecified: Secondary | ICD-10-CM | POA: Diagnosis not present

## 2020-03-30 MED ORDER — ESCITALOPRAM OXALATE 5 MG PO TABS
5.0000 mg | ORAL_TABLET | Freq: Every day | ORAL | 0 refills | Status: DC
Start: 2020-03-30 — End: 2020-04-23

## 2020-03-30 NOTE — Progress Notes (Signed)
Due to national recommendations of social distancing related to the COVID19 pandemic, an audio-only tele-health visit was felt to be the most appropriate encounter type for this patient today. I connected with  Chase Stewart on 03/30/20 utilizing audio-only technology and verified that I am speaking with the correct person using two identifiers. The patient was located at their place of employment in car, and I was located at the office of [facility] during the encounter. I discussed the limitations of evaluation and management by telemedicine. The patient expressed understanding and agreed to proceed.   Subjective:  Patient ID: Chase Stewart, male    DOB: 04/04/1985  Age: 35 y.o. MRN: [id number]  CC:  Chief Complaint  Patient presents with  . Anxiety    Discuss anxiety medication today  . Back Pain      HPI  This patient arrives today for the above.  Anxiety: He continues to have anxiety and worry regarding his back pain that radiates around his rib cage.  He has not been given a clear diagnosis as to why he is having this pain.  He is interested in starting some medication aimed at treating his anxiety.    Back pain: He continues to have upper back pain that radiates to his neck and around his rib cage to his chest.  He tells me the pain continues to be present and seems to actually be getting progressively worse.  He tells me when he palpates the area near his xiphoid process he experiences some achy tenderness.  The pain will also occur with certain movements such as bending forward.  This is been ongoing for approximately 7 months.  He has had an extensive work-up thus far has seen orthopedics which he continues to follow with.  He did have an MRI done with them which showed small disc protrusions but did not show any significant impingement on his spinal cord.  Orthopedics does not feel that the disc protrusions are causing his pain.  He does follow with physical medicine  rehabilitation and takes meloxicam daily without much improvement in his pain.  He has also undergone work-up with cardiology, allergies, infectious disease, and endocrinology.  No significant abnormalities noted except for possibly false negative recommend spotted fever.  The patient today tells me he is concerned that may be he has Lyme disease and this may have been missed.  He tells me the only time he has felt any relief over the last 7 months with his pain was when he was taking doxycycline for treatment of [facility] spotted fever.  Past Medical History:  Diagnosis Date  . Anxiety   . Chronic kidney disease    h/o kidney stones  . Depression   . Migraines       Family History  Problem Relation Age of Onset  . Hyperthyroidism Father   . CAD Father     Social History   Social History Narrative  . Not on file   Social History   Tobacco Use  . Smoking status: Never Smoker  . Smokeless tobacco: Never Used  Substance Use Topics  . Alcohol use: No     Current Meds  Medication Sig  . escitalopram (LEXAPRO) 5 MG tablet Take 1 tablet (5 mg total) by mouth daily.  . meloxicam (MOBIC) 15 MG tablet Take 15 mg by mouth daily.    ROS:  See HPI   Objective:   Today's Vitals: Ht 5' 8.5" (1.74 m)  Wt 145 lb (65.8 kg)   BMI 21.73 kg/m  Vitals with BMI 03/30/2020 03/19/2020 03/13/2020  Height 5' 8.5" 5' 8.5" -  Weight 145 lbs 145 lbs 10 oz -  BMI 21.72 21.81 -  Systolic - 112 111  Diastolic - 74 63  Pulse - 69 57     Physical Exam Comprehensive physical exam not completed today as office visit was conducted remotely.  Patient sounded fairly well over the phone.  Patient was alert and oriented, and appeared to have appropriate judgment.       Assessment and Plan   1. Anxiety   2. Body aches   3. Chronic bilateral thoracic back pain      Plan: 1.  We will start him on low-dose of Lexapro and have him follow-up as scheduled in about 1 month. 2.-3.  He is  encouraged to continue to follow-up with physical medicine rehabilitation services.  Also encouraged him to consider going back to infectious disease for further evaluation about possible Lyme disease and this is concerning to him.  He is open to this and I will refer him today.   Tests ordered Orders Placed This Encounter  Procedures  . Ambulatory referral to Infectious Disease      Meds ordered this encounter  Medications  . escitalopram (LEXAPRO) 5 MG tablet    Sig: Take 1 tablet (5 mg total) by mouth daily.    Dispense:  90 tablet    Refill:  0    Order Specific Question:   Supervising Provider    Answer:   Chase Stewart [[id number]]    Patient to follow-up in 1 month as scheduled, or sooner as needed.. Total time spent on the telephone was 33 minutes and 10 seconds.   Chase Paddy, NP

## 2020-03-30 NOTE — Patient Instructions (Signed)
Escitalopram Tablets What is this medicine? ESCITALOPRAM (es sye TAL oh pram) is used to treat depression and certain types of anxiety. This medicine may be used for other purposes; ask your health care provider or pharmacist if you have questions. COMMON BRAND NAME(S): Lexapro What should I tell my health care provider before I take this medicine? They need to know if you have any of these conditions:  bipolar disorder or a family history of bipolar disorder  diabetes  glaucoma  heart disease  kidney or liver disease  receiving electroconvulsive therapy  seizures (convulsions)  suicidal thoughts, plans, or attempt by you or a family member  an unusual or allergic reaction to escitalopram, the related drug citalopram, other medicines, foods, dyes, or preservatives  pregnant or trying to become pregnant  breast-feeding How should I use this medicine? Take this medicine by mouth with a glass of water. Follow the directions on the prescription label. You can take it with or without food. If it upsets your stomach, take it with food. Take your medicine at regular intervals. Do not take it more often than directed. Do not stop taking this medicine suddenly except upon the advice of your doctor. Stopping this medicine too quickly may cause serious side effects or your condition may worsen. A special MedGuide will be given to you by the pharmacist with each prescription and refill. Be sure to read this information carefully each time. Talk to your pediatrician regarding the use of this medicine in children. Special care may be needed. Overdosage: If you think you have taken too much of this medicine contact a poison control center or emergency room at once. NOTE: This medicine is only for you. Do not share this medicine with others. What if I miss a dose? If you miss a dose, take it as soon as you can. If it is almost time for your next dose, take only that dose. Do not take double or  extra doses. What may interact with this medicine? Do not take this medicine with any of the following medications:  certain medicines for fungal infections like fluconazole, itraconazole, ketoconazole, posaconazole, voriconazole  cisapride  citalopram  dronedarone  linezolid  MAOIs like Carbex, Eldepryl, Marplan, Nardil, and Parnate  methylene blue (injected into a vein)  pimozide  thioridazine This medicine may also interact with the following medications:  alcohol  amphetamines  aspirin and aspirin-like medicines  carbamazepine  certain medicines for depression, anxiety, or psychotic disturbances  certain medicines for migraine headache like almotriptan, eletriptan, frovatriptan, naratriptan, rizatriptan, sumatriptan, zolmitriptan  certain medicines for sleep  certain medicines that treat or prevent blood clots like warfarin, enoxaparin, dalteparin  cimetidine  diuretics  dofetilide  fentanyl  furazolidone  isoniazid  lithium  metoprolol  NSAIDs, medicines for pain and inflammation, like ibuprofen or naproxen  other medicines that prolong the QT interval (cause an abnormal heart rhythm)  procarbazine  rasagiline  supplements like St. John's wort, kava kava, valerian  tramadol  tryptophan  ziprasidone This list may not describe all possible interactions. Give your health care provider a list of all the medicines, herbs, non-prescription drugs, or dietary supplements you use. Also tell them if you smoke, drink alcohol, or use illegal drugs. Some items may interact with your medicine. What should I watch for while using this medicine? Tell your doctor if your symptoms do not get better or if they get worse. Visit your doctor or health care professional for regular checks on your progress. Because it may   take several weeks to see the full effects of this medicine, it is important to continue your treatment as prescribed by your doctor. Patients  and their families should watch out for new or worsening thoughts of suicide or depression. Also watch out for sudden changes in feelings such as feeling anxious, agitated, panicky, irritable, hostile, aggressive, impulsive, severely restless, overly excited and hyperactive, or not being able to sleep. If this happens, especially at the beginning of treatment or after a change in dose, call your health care professional. You may get drowsy or dizzy. Do not drive, use machinery, or do anything that needs mental alertness until you know how this medicine affects you. Do not stand or sit up quickly, especially if you are an older patient. This reduces the risk of dizzy or fainting spells. Alcohol may interfere with the effect of this medicine. Avoid alcoholic drinks. Your mouth may get dry. Chewing sugarless gum or sucking hard candy, and drinking plenty of water may help. Contact your doctor if the problem does not go away or is severe. What side effects may I notice from receiving this medicine? Side effects that you should report to your doctor or health care professional as soon as possible:  allergic reactions like skin rash, itching or hives, swelling of the face, lips, or tongue  anxious  black, tarry stools  changes in vision  confusion  elevated mood, decreased need for sleep, racing thoughts, impulsive behavior  eye pain  fast, irregular heartbeat  feeling faint or lightheaded, falls  feeling agitated, angry, or irritable  hallucination, loss of contact with reality  loss of balance or coordination  loss of memory  painful or prolonged erections  restlessness, pacing, inability to keep still  seizures  stiff muscles  suicidal thoughts or other mood changes  trouble sleeping  unusual bleeding or bruising  unusually weak or tired  vomiting Side effects that usually do not require medical attention (report to your doctor or health care professional if they  continue or are bothersome):  changes in appetite  change in sex drive or performance  headache  increased sweating  indigestion, nausea  tremors This list may not describe all possible side effects. Call your doctor for medical advice about side effects. You may report side effects to FDA at 1-800-FDA-1088. Where should I keep my medicine? Keep out of reach of children. Store at room temperature between 15 and 30 degrees C (59 and 86 degrees F). Throw away any unused medicine after the expiration date. NOTE: This sheet is a summary. It may not cover all possible information. If you have questions about this medicine, talk to your doctor, pharmacist, or health care provider.  2021 Elsevier/Gold Standard (2019-11-18 09:53:34)  

## 2020-04-01 ENCOUNTER — Other Ambulatory Visit: Payer: Self-pay

## 2020-04-01 ENCOUNTER — Ambulatory Visit (INDEPENDENT_AMBULATORY_CARE_PROVIDER_SITE_OTHER): Payer: 59 | Admitting: Internal Medicine

## 2020-04-01 ENCOUNTER — Encounter: Payer: Self-pay | Admitting: Internal Medicine

## 2020-04-01 ENCOUNTER — Ambulatory Visit: Payer: 59 | Admitting: Internal Medicine

## 2020-04-01 ENCOUNTER — Ambulatory Visit
Payer: 59 | Attending: Student in an Organized Health Care Education/Training Program | Admitting: Student in an Organized Health Care Education/Training Program

## 2020-04-01 VITALS — BP 101/71 | HR 70 | Temp 98.1°F | Wt 147.0 lb

## 2020-04-01 DIAGNOSIS — W57XXXD Bitten or stung by nonvenomous insect and other nonvenomous arthropods, subsequent encounter: Secondary | ICD-10-CM | POA: Diagnosis not present

## 2020-04-01 DIAGNOSIS — R52 Pain, unspecified: Secondary | ICD-10-CM

## 2020-04-01 NOTE — Patient Instructions (Signed)
We can recheck lyme, but this was already tested and was negative. And furthermore you also had 21 days of doxycycline which is what we used to treat lyme disease  I do not think you have lyme disease  I can recheck at your request, but keep in mind that frequent testing problem can lead to false positive result   I have placed also placed inflammatory markers and rheumatologic screen including thyroid and muscle enzymes to start the workup for you.   This problem should be addressed further with rheumatology.  At this time I do not believe you have an active infectious disease process

## 2020-04-01 NOTE — Progress Notes (Signed)
[facility]  Reason for Consult:igm positive rmsf Referring Provider: Ernst Bowler    Patient Active Problem List   Diagnosis Date Noted  . 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  . Shoulder pain 08/28/2015      HPI: Chase Stewart is a 35 y.o. male otherwise healthy referred for positive igm rmsf initially, now re referred back for body ache by his pcp  04/01/2020 id visit Patient seen by me previously to evaluate for persistent RMSF igM He had lyme testing before seeing me as well and that was negative He had a history of tick bite summer 2021 He was previously seeing rheumatology who had placed him on prednisone for musculoskeletal sx He also was given 21 day of doxy as well  He said he has nonspecific discomfort/pain around the mid trunk section, and not a good appetite and possibly some weight loss. He thinks he has 10 pound weight loss the past year  No n/v/diarrhea/joint swelling/headache/numbness-tingling, dysuria His previous inflammatory markers a few weeks ago was normal and so was the cbc profile. His lft was mildly elevated  He even has a chest ct with contrast 3/04 after he saw me and that just showed small pulm nodule  He hasn't followed up with rheumatology as we discussed previously  His pcp wants [place] to evaluate for lyme  Initial 2/25 id visit -------------------- He had tick bites summer of 2021. He developed a rash on his arms and subsequently fatigue/chest wall pain. Timing of onset and tickbite unclear. He was seeing his pcp who checked rmsf 12/2019 which was positive for igm. He was also referred to rheumatology who gave him prednisone. He was given 21 days course of doxy as well by pcp.  His fatigue had resolved a week into doxy but chest pain still there. He is functional and maintains good appetite wihtout b sx otherwise  He was somehow referred to allergy who repeat his rmsf twice 01/2020 and 02/2020 with persistent igm elevation  but not igg. So he was referred to me  No diarrhea, headache. His lft has appeared normal. Lyme serology was negative recently  He lives in [place] and no travel [place] or [place]   Review of Systems: ROS Negative other ros      Past Medical History:  Diagnosis Date  . Anxiety   . Chronic kidney disease    h/o kidney stones  . Depression   . Migraines     Social History   Tobacco Use  . Smoking status: Never Smoker  . Smokeless tobacco: Never Used  Vaping Use  . Vaping Use: Never used  Substance Use Topics  . Alcohol use: No  . Drug use: Not Currently    Types: Marijuana    Family History  Problem Relation Age of Onset  . Hyperthyroidism Father   . CAD Father     No Known Allergies  OBJECTIVE: Vitals:   04/01/20 1604  BP: 101/71  Pulse: 70  Temp: 98.1 F (36.7 C)  TempSrc: Oral  Weight: 147 lb (66.7 kg)   Body mass index is 22.03 kg/m.   Physical Exam Well appearing, conversant, no distress heent normocephalic; per; conj clear; eomi Neck supple cv rrr no mrg Lungs clear abd s/nt Ext no edema Neuro cn2-12 intact; nonfocal Skin no rash msk no synovitis Psych alert/oriented  Lab: Lab Results  Component Value Date   WBC 6.3 03/11/2020   HGB 14.7 03/11/2020   HCT 42.4 03/11/2020  MCV 92 03/11/2020   PLT 195 01/25/2020     Chemistry      Component Value Date/Time   NA 143 03/11/2020 1523   K 4.5 03/11/2020 1523   CL 103 03/11/2020 1523   CO2 22 03/11/2020 1523   BUN 13 03/11/2020 1523   CREATININE 1.02 03/11/2020 1523      Component Value Date/Time   CALCIUM 9.2 03/11/2020 1523   ALKPHOS 84 03/11/2020 1523   AST 44 (H) 03/11/2020 1523   ALT 51 (H) 03/11/2020 1523   BILITOT 0.8 03/11/2020 1523     Lab Results  Component Value Date   CRP 4 03/11/2020     Microbiology:  Serology:  Imaging: 3/04 chest ct contrast Reviewed No acute intrathoracic abnormalities.  Tiny subpleural nodule RIGHT lower lobe 4 mm  diameter, nonspecific; no follow-up needed if patient is low-risk. Non-contrast chest CT can be considered in 12 months if patient is high-risk. This recommendation follows the consensus statement: Guidelines for Management of Incidental Pulmonary Nodules Detected on CT Images: From the Fleischner Society 2017; Radiology 2017; 284:228-243.  Enhancing lesion at the spleen question small hemangioma 11 mm diameter.  Assessment/plan: Problem List Items Addressed This Visit   None   Visit Diagnoses    Body aches    -  Primary   Relevant Orders   B. burgdorfi antibodies   CBC   Comprehensive metabolic panel   Sedimentation rate   C-reactive protein   Antinuclear Antib (ANA)   Antistreptolysin O titer   Cyclic citrul peptide antibody, IgG ([facility])   Rheumatoid Factor   CK   Aldolase   Thyroid Panel With TSH   Tick bite, unspecified site, subsequent encounter       Relevant Orders   B. 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 Discussed natural history and test evoluation of rmsf. He doesn't seem to have a syndrome suggestive of such. Other tick associated disease like viral could present similarly. I am not sure if he every had an infectious syndrome at all.    --------- 3/23 id f/u I do not see a syndrome of infectious etiology. I can't explain his nonspecific body trunk myalgia He doesn't have evidence of bacterial related tick borne disease at this time. Lyme testing was negative previously and had empiric doxy 21 days even by pcp.  He has mild lft elevation which is nonspecific. Cbc completely normal. I do not even think whipple would explain this. He has really no fever/b sx otherwise that would keep [place] hooked on a fishing  expedition for ID etiology at this time  -I will send repeat lyme test but discuss false positive cases rise the more you test a low prevalence population -I will recheck cmp/cbc, along with esr/crp/ana/aso/rf -f/u rheum as I would presume given chronic myagia/msk sx, this might warrant nonspecific sx management even if no rheumatologic causes are found  -I will discuss with patient if any labs are concerning from id standpoint     Follow-up: No follow-ups on file.  Jabier Mutton, [place] [facility] [place] [phone] pager   [phone] cell 04/01/2020, 4:24 PM

## 2020-04-02 ENCOUNTER — Telehealth: Payer: Self-pay

## 2020-04-02 ENCOUNTER — Ambulatory Visit: Payer: 59 | Admitting: Nurse Practitioner

## 2020-04-02 DIAGNOSIS — M5414 Radiculopathy, thoracic region: Secondary | ICD-10-CM

## 2020-04-02 DIAGNOSIS — R0781 Pleurodynia: Secondary | ICD-10-CM | POA: Insufficient documentation

## 2020-04-02 DIAGNOSIS — M5134 Other intervertebral disc degeneration, thoracic region: Secondary | ICD-10-CM

## 2020-04-02 DIAGNOSIS — M549 Dorsalgia, unspecified: Secondary | ICD-10-CM | POA: Insufficient documentation

## 2020-04-02 NOTE — Telephone Encounter (Signed)
Chase Stewart had a visit with Dr. Raymondo Band (Infectious Disease) yesterday who recommended a referral to rheumatology for chronic back pain.  Chase Stewart called back & is requesting the rheumatology referral with [facility] & gave me the fax # [phone].  AMD

## 2020-04-06 DIAGNOSIS — R899 Unspecified abnormal finding in specimens from other organs, systems and tissues: Secondary | ICD-10-CM

## 2020-04-06 DIAGNOSIS — M791 Myalgia, unspecified site: Secondary | ICD-10-CM | POA: Diagnosis not present

## 2020-04-06 LAB — B. BURGDORFI ANTIBODIES BY WB
B burgdorferi IgG Abs (IB): NEGATIVE
B burgdorferi IgM Abs (IB): POSITIVE — AB
Lyme Disease 18 kD IgG: NONREACTIVE
Lyme Disease 23 kD IgG: NONREACTIVE
Lyme Disease 23 kD IgM: REACTIVE — AB
Lyme Disease 28 kD IgG: NONREACTIVE
Lyme Disease 30 kD IgG: NONREACTIVE
Lyme Disease 39 kD IgG: NONREACTIVE
Lyme Disease 39 kD IgM: REACTIVE — AB
Lyme Disease 41 kD IgG: REACTIVE — AB
Lyme Disease 41 kD IgM: NONREACTIVE
Lyme Disease 45 kD IgG: NONREACTIVE
Lyme Disease 58 kD IgG: NONREACTIVE
Lyme Disease 66 kD IgG: NONREACTIVE
Lyme Disease 93 kD IgG: REACTIVE — AB

## 2020-04-06 LAB — C-REACTIVE PROTEIN: CRP: 2.2 mg/L (ref <8.0)

## 2020-04-06 LAB — COMPREHENSIVE METABOLIC PANEL
AG Ratio: 1.4 (calc) (ref 1.0–2.5)
ALT: 93 U/L — ABNORMAL HIGH (ref 9–46)
AST: 48 U/L — ABNORMAL HIGH (ref 10–40)
Albumin: 4.6 g/dL (ref 3.6–5.1)
Alkaline phosphatase (APISO): 82 U/L (ref 36–130)
BUN: 12 mg/dL (ref 7–25)
CO2: 29 mmol/L (ref 20–32)
Calcium: 9.6 mg/dL (ref 8.6–10.3)
Chloride: 102 mmol/L (ref 98–110)
Creat: 0.97 mg/dL (ref 0.60–1.35)
Globulin: 3.3 g/dL (calc) (ref 1.9–3.7)
Glucose, Bld: 82 mg/dL (ref 65–99)
Potassium: 4.2 mmol/L (ref 3.5–5.3)
Sodium: 138 mmol/L (ref 135–146)
Total Bilirubin: 0.8 mg/dL (ref 0.2–1.2)
Total Protein: 7.9 g/dL (ref 6.1–8.1)

## 2020-04-06 LAB — ANA: Anti Nuclear Antibody (ANA): NEGATIVE

## 2020-04-06 LAB — ALDOLASE: Aldolase: 5.9 U/L (ref <=8.1)

## 2020-04-06 LAB — CYCLIC CITRUL PEPTIDE ANTIBODY, IGG: Cyclic Citrullin Peptide Ab: <16 UNITS

## 2020-04-06 LAB — RHEUMATOID FACTOR: Rheumatoid fact SerPl-aCnc: <14 IU/mL (ref <14)

## 2020-04-06 LAB — LYME AB SCREEN %: Lyme AB Screen: 1.17 index — ABNORMAL HIGH

## 2020-04-06 LAB — ANTISTREPTOLYSIN O TITER: ASO: 76 IU/mL (ref <200)

## 2020-04-06 LAB — SEDIMENTATION RATE: Sed Rate: 6 mm/h (ref 0–15)

## 2020-04-06 LAB — THYROID PANEL WITH TSH
Free Thyroxine Index: 2.3 (ref 1.4–3.8)
T3 Uptake: 25 % (ref 22–35)
T4, Total: 9.3 ug/dL (ref 4.9–10.5)
TSH: 2.71 mIU/L (ref 0.40–4.50)

## 2020-04-07 MED ORDER — DOXYCYCLINE HYCLATE 100 MG PO TABS: 100.0000 mg | ORAL_TABLET | Freq: Two times a day (BID) | ORAL | 0 refills | Status: AC

## 2020-04-15 ENCOUNTER — Telehealth: Payer: Self-pay

## 2020-04-15 DIAGNOSIS — M5414 Radiculopathy, thoracic region: Secondary | ICD-10-CM

## 2020-04-15 DIAGNOSIS — M5134 Other intervertebral disc degeneration, thoracic region: Secondary | ICD-10-CM

## 2020-04-15 NOTE — Telephone Encounter (Signed)
Chase Stewart called the clinic today requesting another Rheumatology referral.  He said the Rheumatologist that he had requested in not in-network for [employer] health insurance.  He wants a doctor that is in-network.  AMD

## 2020-04-20 DIAGNOSIS — M9901 Segmental and somatic dysfunction of cervical region: Secondary | ICD-10-CM | POA: Diagnosis not present

## 2020-04-20 DIAGNOSIS — M9902 Segmental and somatic dysfunction of thoracic region: Secondary | ICD-10-CM | POA: Diagnosis not present

## 2020-04-20 DIAGNOSIS — M62830 Muscle spasm of back: Secondary | ICD-10-CM | POA: Diagnosis not present

## 2020-04-20 DIAGNOSIS — M5414 Radiculopathy, thoracic region: Secondary | ICD-10-CM | POA: Diagnosis not present

## 2020-04-23 ENCOUNTER — Encounter (INDEPENDENT_AMBULATORY_CARE_PROVIDER_SITE_OTHER): Payer: Self-pay | Admitting: Nurse Practitioner

## 2020-04-23 ENCOUNTER — Other Ambulatory Visit: Payer: Self-pay

## 2020-04-23 ENCOUNTER — Ambulatory Visit (INDEPENDENT_AMBULATORY_CARE_PROVIDER_SITE_OTHER): Payer: 59 | Admitting: Nurse Practitioner

## 2020-04-23 ENCOUNTER — Telehealth (INDEPENDENT_AMBULATORY_CARE_PROVIDER_SITE_OTHER): Payer: Self-pay | Admitting: Nurse Practitioner

## 2020-04-23 VITALS — BP 124/68 | HR 62 | Temp 97.5°F | Ht 68.1 in | Wt 144.6 lb

## 2020-04-23 DIAGNOSIS — R69 Illness, unspecified: Secondary | ICD-10-CM | POA: Diagnosis not present

## 2020-04-23 DIAGNOSIS — R739 Hyperglycemia, unspecified: Secondary | ICD-10-CM

## 2020-04-23 DIAGNOSIS — R748 Abnormal levels of other serum enzymes: Secondary | ICD-10-CM

## 2020-04-23 DIAGNOSIS — Z1159 Encounter for screening for other viral diseases: Secondary | ICD-10-CM

## 2020-04-23 DIAGNOSIS — Z131 Encounter for screening for diabetes mellitus: Secondary | ICD-10-CM

## 2020-04-23 DIAGNOSIS — Z0001 Encounter for general adult medical examination with abnormal findings: Secondary | ICD-10-CM

## 2020-04-23 DIAGNOSIS — F419 Anxiety disorder, unspecified: Secondary | ICD-10-CM

## 2020-04-23 DIAGNOSIS — Z113 Encounter for screening for infections with a predominantly sexual mode of transmission: Secondary | ICD-10-CM

## 2020-04-23 MED ORDER — ESCITALOPRAM OXALATE 5 MG PO TABS
5.0000 mg | ORAL_TABLET | Freq: Every day | ORAL | 0 refills | Status: DC
Start: 2020-04-23 — End: 2020-05-08

## 2020-04-23 NOTE — Telephone Encounter (Signed)
I have ordered an ultrasound of his RUQ for evaluation of his elevated liver enzymes. Please work on this when you have time.

## 2020-04-23 NOTE — Progress Notes (Signed)
Subjective:  Patient ID: Chase Stewart, male    DOB: [birth year]  Age: 35 y.o. MRN: [id number]  CC:  Chief Complaint  Patient presents with  . Annual Exam    Doing okay  . Anxiety      HPI  This patient arrives today for the above.  Overall he tells me he is feeling better.  He does feel that his mood is a bit better he is not feeling quite as anxious.  He is still having some chest pain but he is being evaluated by infectious disease and plans on following up with rheumatology as scheduled.  He is being treated with a course of doxycycline for possible Lyme disease.  I did start him on low-dose Lexapro at last office visit he tells me he is tolerating medication well and has noticed that his energy level has improved and he is not ruminating on worries of significant disease or death as much as he was.  He denies suicidal ideation.  Past Medical History:  Diagnosis Date  . Anxiety   . Chronic kidney disease    h/o kidney stones  . Depression   . Migraines       Family History  Problem Relation Age of Onset  . Hyperthyroidism Father   . CAD Father     Social History   Social History Narrative  . Not on file   Social History   Tobacco Use  . Smoking status: Never Smoker  . Smokeless tobacco: Never Used  Substance Use Topics  . Alcohol use: No     Current Meds  Medication Sig  . doxycycline (VIBRA-TABS) 100 MG tablet Take 1 tablet (100 mg total) by mouth 2 (two) times daily for 21 days.  . [DISCONTINUED] escitalopram (LEXAPRO) 5 MG tablet Take 1 tablet (5 mg total) by mouth daily.    ROS:  Review of Systems  Constitutional: Negative for fever and weight loss.  Respiratory: Negative for shortness of breath.   Cardiovascular: Positive for chest pain (similar to the pain he has been experiencing, has undergone cardiac evaluation).  Psychiatric/Behavioral: Negative for suicidal ideas.     Objective:   Today's Vitals: BP 124/68   Pulse 62   Temp  (!) 97.5 F (36.4 C) (Temporal)   Ht 5' 8.1" (1.73 m)   Wt 144 lb 9.6 oz (65.6 kg)   SpO2 98%   BMI 21.92 kg/m  Vitals with BMI 04/23/2020 04/01/2020 03/30/2020  Height 5' 8.1" - 5' 8.5"  Weight 144 lbs 10 oz 147 lbs 145 lbs  BMI 21.92 22.02 21.72  Systolic 124 101 -  Diastolic 68 71 -  Pulse 62 70 -     Physical Exam Vitals reviewed.  Constitutional:      General: He is not in acute distress.    Appearance: Normal appearance. He is not ill-appearing.  HENT:     Head: Normocephalic and atraumatic.     Right Ear: Tympanic membrane, ear canal and external ear normal.     Left Ear: Tympanic membrane, ear canal and external ear normal.  Eyes:     General: No scleral icterus.    Extraocular Movements: Extraocular movements intact.     Conjunctiva/sclera: Conjunctivae normal.     Pupils: Pupils are equal, round, and reactive to light.  Neck:     Vascular: No carotid bruit.  Cardiovascular:     Rate and Rhythm: Normal rate and regular rhythm.     Pulses: Normal pulses.  Heart sounds: Normal heart sounds.  Pulmonary:     Effort: Pulmonary effort is normal.     Breath sounds: Normal breath sounds.  Abdominal:     General: Bowel sounds are normal. There is no distension.     Palpations: There is no mass.     Tenderness: There is no abdominal tenderness.     Hernia: No hernia is present.  Musculoskeletal:        General: No swelling or tenderness.     Cervical back: Normal range of motion and neck supple. No rigidity.  Lymphadenopathy:     Cervical: No cervical adenopathy.  Skin:    General: Skin is warm and dry.  Neurological:     General: No focal deficit present.     Mental Status: He is alert and oriented to person, place, and time.     Cranial Nerves: No cranial nerve deficit.     Sensory: No sensory deficit.     Motor: No weakness.     Gait: Gait normal.  Psychiatric:        Mood and Affect: Mood normal.        Behavior: Behavior normal.        Judgment:  Judgment normal.          Assessment and Plan   1. Encounter for general adult medical examination with abnormal findings   2. Elevated liver enzymes   3. Need for hepatitis C screening test   4. Screening for diabetes mellitus   5. Hyperglycemia   6. Routine screening for STI (sexually transmitted infection)   7. Anxiety      Plan: 1.-7.  Overall exam appeared within normal limits.  Per chart review I do see that he has slightly elevated liver enzymes on last 2 metabolic panels that were drawn both were drawn within the last 6 weeks.  We will send for abdominal ultrasound for further evaluation.  We will also screen for hepatitis C and will check for hepatitis B since his liver enzymes were slightly elevated.  He has had 1 episode of hypoglycemia on his lab work as well so we will collect A1c for further evaluation.  He is agreeable to having sexual transmitted infection screening so we will test for HIV and syphilis.  He tells me that his mood is a bit better and he does not feel he needs an increased dose on Lexapro but he would like to stay on the current dose as currently prescribed.  We will continue Lexapro 5 mg daily.  He will plan to follow-up with rheumatology and infectious disease as scheduled.   Tests ordered Orders Placed This Encounter  Procedures  . US Abdomen Limited RUQ (LIVER/GB)  . Hep C Antibody  . Hep B Surface Antibody  . Hep B Surface Antigen  . Hemoglobin A1c  . HIV antibody (with reflex)  . RPR      Meds ordered this encounter  Medications  . escitalopram (LEXAPRO) 5 MG tablet    Sig: Take 1 tablet (5 mg total) by mouth daily.    Dispense:  90 tablet    Refill:  0    Order Specific Question:   Supervising Provider    Answer:   Wilson Singer [[id number]]    Patient to follow-up in 3 months or sooner as needed.  In addition to performing his annual physical exam I also performed an office visit as described above.  Elenore Paddy, NP

## 2020-04-24 LAB — HEMOGLOBIN A1C
Hgb A1c MFr Bld: 4.8 % of total Hgb (ref <5.7)
Mean Plasma Glucose: 91 mg/dL
eAG (mmol/L): 5 mmol/L

## 2020-04-24 LAB — HEPATITIS C ANTIBODY
Hepatitis C Ab: NONREACTIVE
SIGNAL TO CUT-OFF: 0.02 (ref <1.00)

## 2020-04-24 LAB — HEPATITIS B SURFACE ANTIGEN: Hepatitis B Surface Ag: NONREACTIVE

## 2020-04-24 LAB — RPR: RPR Ser Ql: NONREACTIVE

## 2020-04-24 LAB — HIV ANTIBODY (ROUTINE TESTING W REFLEX): HIV 1&2 Ab, 4th Generation: NONREACTIVE

## 2020-04-24 LAB — HEPATITIS B SURFACE ANTIBODY,QUALITATIVE: Hep B S Ab: REACTIVE — AB

## 2020-04-27 ENCOUNTER — Encounter (INDEPENDENT_AMBULATORY_CARE_PROVIDER_SITE_OTHER): Payer: Self-pay | Admitting: Nurse Practitioner

## 2020-04-28 ENCOUNTER — Other Ambulatory Visit: Payer: Self-pay

## 2020-04-28 ENCOUNTER — Ambulatory Visit (HOSPITAL_COMMUNITY)
Admission: RE | Admit: 2020-04-28 | Discharge: 2020-04-28 | Disposition: A | Discharge status: Home / Self Care | Payer: 59 | Source: Ambulatory Visit | Attending: Nurse Practitioner | Admitting: Nurse Practitioner

## 2020-04-28 DIAGNOSIS — R7989 Other specified abnormal findings of blood chemistry: Secondary | ICD-10-CM | POA: Diagnosis not present

## 2020-04-28 DIAGNOSIS — R748 Abnormal levels of other serum enzymes: Secondary | ICD-10-CM | POA: Insufficient documentation

## 2020-04-30 DIAGNOSIS — M9901 Segmental and somatic dysfunction of cervical region: Secondary | ICD-10-CM | POA: Diagnosis not present

## 2020-04-30 DIAGNOSIS — M5414 Radiculopathy, thoracic region: Secondary | ICD-10-CM | POA: Diagnosis not present

## 2020-04-30 DIAGNOSIS — M9902 Segmental and somatic dysfunction of thoracic region: Secondary | ICD-10-CM | POA: Diagnosis not present

## 2020-04-30 DIAGNOSIS — M62830 Muscle spasm of back: Secondary | ICD-10-CM | POA: Diagnosis not present

## 2020-05-06 ENCOUNTER — Other Ambulatory Visit: Payer: Self-pay

## 2020-05-06 DIAGNOSIS — E038 Other specified hypothyroidism: Secondary | ICD-10-CM

## 2020-05-08 ENCOUNTER — Other Ambulatory Visit (INDEPENDENT_AMBULATORY_CARE_PROVIDER_SITE_OTHER): Payer: Self-pay | Admitting: Nurse Practitioner

## 2020-05-08 DIAGNOSIS — F419 Anxiety disorder, unspecified: Secondary | ICD-10-CM

## 2020-05-08 MED ORDER — ESCITALOPRAM OXALATE 5 MG PO TABS: 5.0000 mg | ORAL_TABLET | Freq: Every day | ORAL | 0 refills | Status: DC

## 2020-05-11 DIAGNOSIS — M546 Pain in thoracic spine: Secondary | ICD-10-CM | POA: Diagnosis not present

## 2020-05-11 DIAGNOSIS — R0781 Pleurodynia: Secondary | ICD-10-CM | POA: Diagnosis not present

## 2020-05-11 DIAGNOSIS — R899 Unspecified abnormal finding in specimens from other organs, systems and tissues: Secondary | ICD-10-CM | POA: Diagnosis not present

## 2020-05-12 ENCOUNTER — Other Ambulatory Visit: Payer: Self-pay

## 2020-05-12 ENCOUNTER — Other Ambulatory Visit: Payer: 59

## 2020-05-12 DIAGNOSIS — R899 Unspecified abnormal finding in specimens from other organs, systems and tissues: Secondary | ICD-10-CM

## 2020-05-13 ENCOUNTER — Ambulatory Visit: Payer: 59 | Admitting: "Endocrinology

## 2020-05-13 LAB — B. BURGDORFI ANTIBODIES: B burgdorferi Ab IgG+IgM: <0.9 index

## 2020-05-15 DIAGNOSIS — E038 Other specified hypothyroidism: Secondary | ICD-10-CM | POA: Diagnosis not present

## 2020-05-17 LAB — T3, FREE: T3, Free: 3.5 pg/mL (ref 2.0–4.4)

## 2020-05-17 LAB — THYROID PEROXIDASE ANTIBODY: Thyroperoxidase Ab SerPl-aCnc: 21 IU/mL (ref 0–34)

## 2020-05-17 LAB — THYROGLOBULIN ANTIBODY: Thyroglobulin Antibody: <1 IU/mL (ref 0.0–0.9)

## 2020-05-17 LAB — TESTOSTERONE, FREE, TOTAL, SHBG
Sex Hormone Binding: 39.7 nmol/L (ref 16.5–55.9)
Testosterone, Free: 9 pg/mL (ref 8.7–25.1)
Testosterone: 532 ng/dL (ref 264–916)

## 2020-05-17 LAB — T4, FREE: Free T4: 1.18 ng/dL (ref 0.82–1.77)

## 2020-05-17 LAB — TSH: TSH: 2.21 u[IU]/mL (ref 0.450–4.500)

## 2020-05-18 ENCOUNTER — Ambulatory Visit: Payer: 59 | Admitting: "Endocrinology

## 2020-05-19 ENCOUNTER — Ambulatory Visit: Payer: 59 | Admitting: Internal Medicine

## 2020-05-29 DIAGNOSIS — M62830 Muscle spasm of back: Secondary | ICD-10-CM | POA: Diagnosis not present

## 2020-05-29 DIAGNOSIS — M9901 Segmental and somatic dysfunction of cervical region: Secondary | ICD-10-CM | POA: Diagnosis not present

## 2020-05-29 DIAGNOSIS — M9902 Segmental and somatic dysfunction of thoracic region: Secondary | ICD-10-CM | POA: Diagnosis not present

## 2020-05-29 DIAGNOSIS — M5414 Radiculopathy, thoracic region: Secondary | ICD-10-CM | POA: Diagnosis not present

## 2020-06-16 DIAGNOSIS — M9902 Segmental and somatic dysfunction of thoracic region: Secondary | ICD-10-CM | POA: Diagnosis not present

## 2020-06-16 DIAGNOSIS — M5414 Radiculopathy, thoracic region: Secondary | ICD-10-CM | POA: Diagnosis not present

## 2020-06-18 DIAGNOSIS — M9902 Segmental and somatic dysfunction of thoracic region: Secondary | ICD-10-CM | POA: Diagnosis not present

## 2020-06-18 DIAGNOSIS — M5414 Radiculopathy, thoracic region: Secondary | ICD-10-CM | POA: Diagnosis not present

## 2020-06-22 DIAGNOSIS — M5414 Radiculopathy, thoracic region: Secondary | ICD-10-CM | POA: Diagnosis not present

## 2020-06-22 DIAGNOSIS — M9902 Segmental and somatic dysfunction of thoracic region: Secondary | ICD-10-CM | POA: Diagnosis not present

## 2020-07-01 DIAGNOSIS — M5414 Radiculopathy, thoracic region: Secondary | ICD-10-CM | POA: Diagnosis not present

## 2020-07-01 DIAGNOSIS — M9902 Segmental and somatic dysfunction of thoracic region: Secondary | ICD-10-CM | POA: Diagnosis not present

## 2020-07-23 ENCOUNTER — Ambulatory Visit (INDEPENDENT_AMBULATORY_CARE_PROVIDER_SITE_OTHER): Payer: 59 | Admitting: Nurse Practitioner

## 2020-07-29 ENCOUNTER — Other Ambulatory Visit: Payer: Self-pay

## 2020-07-29 ENCOUNTER — Encounter (INDEPENDENT_AMBULATORY_CARE_PROVIDER_SITE_OTHER): Payer: Self-pay | Admitting: Nurse Practitioner

## 2020-07-29 ENCOUNTER — Ambulatory Visit (INDEPENDENT_AMBULATORY_CARE_PROVIDER_SITE_OTHER): Payer: 59 | Admitting: Nurse Practitioner

## 2020-07-29 VITALS — BP 114/68 | HR 80 | Ht 69.0 in | Wt 144.0 lb

## 2020-07-29 DIAGNOSIS — R748 Abnormal levels of other serum enzymes: Secondary | ICD-10-CM

## 2020-07-29 DIAGNOSIS — G8929 Other chronic pain: Secondary | ICD-10-CM | POA: Diagnosis not present

## 2020-07-29 DIAGNOSIS — R69 Illness, unspecified: Secondary | ICD-10-CM | POA: Diagnosis not present

## 2020-07-29 DIAGNOSIS — M546 Pain in thoracic spine: Secondary | ICD-10-CM | POA: Diagnosis not present

## 2020-07-29 DIAGNOSIS — F419 Anxiety disorder, unspecified: Secondary | ICD-10-CM | POA: Diagnosis not present

## 2020-07-29 LAB — COMPLETE METABOLIC PANEL WITH GFR
AG Ratio: 1.6 (calc) (ref 1.0–2.5)
ALT: 30 U/L (ref 9–46)
AST: 27 U/L (ref 10–40)
Albumin: 4.6 g/dL (ref 3.6–5.1)
Alkaline phosphatase (APISO): 66 U/L (ref 36–130)
BUN: 16 mg/dL (ref 7–25)
CO2: 30 mmol/L (ref 20–32)
Calcium: 9.6 mg/dL (ref 8.6–10.3)
Chloride: 105 mmol/L (ref 98–110)
Creat: 0.98 mg/dL (ref 0.60–1.26)
Globulin: 2.9 g/dL (calc) (ref 1.9–3.7)
Glucose, Bld: 77 mg/dL (ref 65–99)
Potassium: 4.3 mmol/L (ref 3.5–5.3)
Sodium: 140 mmol/L (ref 135–146)
Total Bilirubin: 1.2 mg/dL (ref 0.2–1.2)
Total Protein: 7.5 g/dL (ref 6.1–8.1)
eGFR: 104 mL/min/{1.73_m2} (ref >=60)

## 2020-07-29 NOTE — Progress Notes (Signed)
Subjective:  Patient ID: Chase Stewart, male    DOB: [birth year]  Age: 35 y.o. MRN: [id number]  CC:  Chief Complaint  Patient presents with   Anxiety    Follow up   Other    Elevated liver enzymes   Back Pain      HPI  This patient arrives today for the above.  Anxiety: He continues on Lexapro and is tolerating medication well.  He feels that his mood has improved concerns medication.  Elevated liver enzymes: Last CMP showed AST of 40 and ALT of 93.  Liver ultrasound was completed and it showed no abnormalities.  He does not have any abdominal pain.  Back pain: He continues to have persistent thoracic back pain.  He tells me it is usually mild and he rates it as a 1-2/10 in intensity.  He has had substantial work-up thus far without any answers as to what is causing the pain.  He was also having chest pain previously but this seems to have resolved.  Past Medical History:  Diagnosis Date   Anxiety    Chronic kidney disease    h/o kidney stones   Depression    Migraines       Family History  Problem Relation Age of Onset   Hyperthyroidism Father    CAD Father     Social History   Social History Narrative   Not on file   Social History   Tobacco Use   Smoking status: Never   Smokeless tobacco: Never  Substance Use Topics   Alcohol use: No     Current Meds  Medication Sig   escitalopram (LEXAPRO) 5 MG tablet Take 1 tablet (5 mg total) by mouth daily.    ROS:  Review of Systems  Respiratory:  Negative for shortness of breath.   Cardiovascular:  Negative for chest pain.  Musculoskeletal:  Positive for back pain.  Neurological:  Negative for dizziness and headaches.    Objective:   Today's Vitals: BP 114/68 (BP Location: Left Arm, Patient Position: Sitting, Cuff Size: Large)   Pulse 80   Ht 5' 9"  (1.753 m)   Wt 144 lb (65.3 kg)   SpO2 98%   BMI 21.27 kg/m  Vitals with BMI 07/29/2020 04/23/2020 04/01/2020  Height 5' 9"  5' 8.1" -  Weight 144  lbs 144 lbs 10 oz 147 lbs  BMI 21.26 75.64 33.29  Systolic 518 841 660  Diastolic 68 68 71  Pulse 80 62 70    PHQ9 SCORE ONLY 07/29/2020 04/23/2020 03/19/2020  PHQ-9 Total Score 0 2 8     Physical Exam Vitals reviewed.  Constitutional:      Appearance: Normal appearance.  HENT:     Head: Normocephalic and atraumatic.  Cardiovascular:     Rate and Rhythm: Normal rate and regular rhythm.  Pulmonary:     Effort: Pulmonary effort is normal.     Breath sounds: Normal breath sounds.  Abdominal:     Palpations: There is no hepatomegaly.  Musculoskeletal:     Cervical back: Neck supple.  Skin:    General: Skin is warm and dry.  Neurological:     Mental Status: He is alert and oriented to person, place, and time.  Psychiatric:        Mood and Affect: Mood normal.        Behavior: Behavior normal.        Thought Content: Thought content normal.  Judgment: Judgment normal.         Assessment and Plan   1. Chronic bilateral thoracic back pain   2. Elevated liver enzymes   3. Anxiety      Plan: 1.  We will refer to physical medicine rehabilitation to see if there is any additional work-up or assistance that can be done to help with the back pain. 2.  We will check CMP to monitor liver enzymes today further recommendations may be made based upon these results. 3.  Mood appears to be stable on Lexapro we will continue taking this as prescribed.   Tests ordered Orders Placed This Encounter  Procedures   CMP with eGFR([facility])   Ambulatory referral to Physical Medicine Rehab      No orders of the defined types were placed in this encounter.   Patient to follow-up in 6 months or sooner as needed.  Ailene Ards, NP

## 2020-08-03 ENCOUNTER — Other Ambulatory Visit: Payer: Self-pay

## 2020-08-03 NOTE — Progress Notes (Addendum)
Pt completed random UDS&ETOH. [employer] Specimen ID: [id number]

## 2020-08-04 ENCOUNTER — Encounter: Payer: Self-pay | Admitting: Physical Medicine and Rehabilitation

## 2020-08-26 DIAGNOSIS — B360 Pityriasis versicolor: Secondary | ICD-10-CM | POA: Diagnosis not present

## 2020-10-15 DIAGNOSIS — M5414 Radiculopathy, thoracic region: Secondary | ICD-10-CM | POA: Diagnosis not present

## 2020-10-15 DIAGNOSIS — M9902 Segmental and somatic dysfunction of thoracic region: Secondary | ICD-10-CM | POA: Diagnosis not present

## 2020-10-26 DIAGNOSIS — M9902 Segmental and somatic dysfunction of thoracic region: Secondary | ICD-10-CM | POA: Diagnosis not present

## 2020-10-26 DIAGNOSIS — M5414 Radiculopathy, thoracic region: Secondary | ICD-10-CM | POA: Diagnosis not present

## 2020-11-12 ENCOUNTER — Other Ambulatory Visit: Payer: Self-pay

## 2020-11-12 ENCOUNTER — Encounter: Payer: 59 | Attending: Physical Medicine and Rehabilitation | Admitting: Physical Medicine and Rehabilitation

## 2020-11-12 ENCOUNTER — Encounter: Payer: Self-pay | Admitting: Physical Medicine and Rehabilitation

## 2020-11-12 VITALS — BP 121/50 | HR 58 | Temp 97.9°F | Ht 69.0 in | Wt 154.8 lb

## 2020-11-12 DIAGNOSIS — F419 Anxiety disorder, unspecified: Secondary | ICD-10-CM | POA: Diagnosis not present

## 2020-11-12 DIAGNOSIS — R69 Illness, unspecified: Secondary | ICD-10-CM | POA: Diagnosis not present

## 2020-11-12 DIAGNOSIS — R0781 Pleurodynia: Secondary | ICD-10-CM | POA: Insufficient documentation

## 2020-11-12 DIAGNOSIS — G588 Other specified mononeuropathies: Secondary | ICD-10-CM | POA: Insufficient documentation

## 2020-11-12 MED ORDER — GABAPENTIN 100 MG PO CAPS: 100.0000 mg | ORAL_CAPSULE | Freq: Three times a day (TID) | ORAL | 3 refills | Status: DC | PRN

## 2020-11-12 MED ORDER — ESCITALOPRAM OXALATE 5 MG PO TABS: 5.0000 mg | ORAL_TABLET | Freq: Every day | ORAL | 0 refills | Status: DC

## 2020-11-12 MED ORDER — LIDOCAINE 5 % EX PTCH: 1.0000 | MEDICATED_PATCH | CUTANEOUS | 0 refills | Status: DC

## 2020-11-12 NOTE — Patient Instructions (Signed)

## 2020-11-12 NOTE — Progress Notes (Signed)
Subjective:    Patient ID: Chase Stewart, male    DOB: [birth year], 35 y.o.   MRN: [id number]  HPI Chase Stewart is a 35 year old man who presents to establish care for mid to upper back pain.  In the past year he started having mid to upper back pain with no inciting event -at time pain spreads to his chest -he has seen a heart doctor -he has seen a back doctor and had an MRI and CT scans -he lost his PCP in the past few months as MD passed.  -his chiropractor did an XR and told him he has a disc is slightly twisted and there is compression of a nerve.  -when he expands his chest he feels it -he feels it every day and has gotten used to the pain -he tried taking ibuprofen consistently every day.  -he has also tried meloxicam, prednisone -he thinks he took gabapentin- he does not think he has gabapentin    Pain Inventory Average Pain 5 Pain Right Now 5 My pain is stabbing and aching  In the last 24 hours, has pain interfered with the following? General activity 4 Relation with others 4 Enjoyment of life 4 What TIME of day is your pain at its worst? 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  yes  has had xrays and MRI  Primary care Jiles Prows NP    Family History  Problem Relation Age of Onset   Hyperthyroidism Father    CAD Father    Social History   Socioeconomic History   Marital status: Married    Spouse name: Not on file   Number of children: Not on file   Years of education: Not on file   Highest education level: Not on file  Occupational History   Not on file  Tobacco Use   Smoking status: Never   Smokeless tobacco: Never  Vaping Use   Vaping Use: Never used  Substance and Sexual Activity   Alcohol use: No   Drug use: Not  Currently    Types: Marijuana   Sexual activity: Yes  Other Topics Concern   Not on file  Social History Narrative   Not on file   Social Determinants of Health   Financial Resource Strain: Not on file  Food Insecurity: Not on file  Transportation Needs: Not on file  Physical Activity: Not on file  Stress: Not on file  Social Connections: Not on file   Past Surgical History:  Procedure Laterality Date   ROTATOR CUFF REPAIR Right    SHOULDER ARTHROSCOPY WITH OPEN ROTATOR CUFF REPAIR AND DISTAL CLAVICLE ACROMINECTOMY Left 05/26/2015   Procedure: SHOULDER ARTHROSCOPY WITH ANTERIOR AND SUPERIOR LABRAL REPAIR;  Surgeon: Juanell Fairly, MD;  Location: [facility] ORS;  Service: Orthopedics;  Laterality: Left;   TONSILLECTOMY     TYMPANOSTOMY TUBE PLACEMENT     Past Medical History:  Diagnosis Date   Anxiety    Chronic kidney disease    h/o kidney stones   Depression    Migraines    BP (!) 121/50   Pulse (!) 58   Temp 97.9 F (36.6 C)   Ht 5\' 9"  (1.753 m)   Wt 154 lb 12.8 oz (70.2 kg)   SpO2 98%  BMI 22.86 kg/m   Opioid Risk Score:   Fall Risk Score:  `1  Depression screen PHQ 2/9  Depression screen [facility] 2/9 11/12/2020 07/29/2020 04/23/2020 03/19/2020  Decreased Interest 1 0 0 1  Down, Depressed, Hopeless 1 0 0 2  PHQ - 2 Score 2 0 0 3  Altered sleeping 1 0 1 0  Tired, decreased energy 1 0 1 3  Change in appetite 0 0 0 0  Feeling bad or failure about yourself  0 0 0 0  Trouble concentrating 0 0 0 2  Moving slowly or fidgety/restless 0 0 0 0  Suicidal thoughts 0 0 0 0  PHQ-9 Score 4 0 2 8  Difficult doing work/chores Somewhat difficult Not difficult at all Not difficult at all Somewhat difficult    Review of Systems  Constitutional: Negative.   HENT: Negative.    Eyes: Negative.   Respiratory: Negative.    Cardiovascular: Negative.   Gastrointestinal: Negative.   Endocrine: Negative.   Genitourinary: Negative.   Musculoskeletal:  Positive for back pain.  Skin:  Negative.   Allergic/Immunologic: Negative.   Neurological: Negative.   Hematological: Negative.   Psychiatric/Behavioral:  Positive for dysphoric mood. The patient is nervous/anxious.   All other systems reviewed and are negative.     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  -Discussed benefits of exercise in reducing pain. -discussed benefits of wearing a figure 8 brace -encouraged massage -Discussed following foods that may reduce pain: 1) Ginger (especially studied for arthritis)- reduce leukotriene production to decrease inflammation 2) Blueberries- high in phytonutrients that decrease inflammation 3) Salmon- marine omega-3s reduce joint swelling and pain 4) Pumpkin seeds- reduce inflammation 5) dark chocolate- reduces inflammation 6) turmeric- reduces inflammation 7) tart cherries - reduce pain and stiffness 8) extra virgin olive oil - its compound olecanthal helps to block prostaglandins  9) chili peppers- can be eaten or applied topically via capsaicin 10) mint- helpful for headache, muscle aches, joint pain, and itching 11) garlic- reduces inflammation  Link to further information on diet for chronic pain: http://[email or web address].[email or web address]   2) Depression and anxiety: -regarding all the medical workup this year.  -lexapro 5mg  daily prescribed.  -Discussed the following foods that have been show to reduce anxiety: 1) nuts, mushrooms, soy beans due to their high selenium content. Upper limit of toxicity of  selenium is 471mcg/day so no more than 3-4 80m nuts per day.  2) Fatty fish such as salmon, mackerel, sardines, trout, and herring- high in omega-3 fatty acids 3) Eggs- increases serotonin and dopamine 4) Pumpkin seeds- high in omega-3 fatty acids 5) dark chocolate- high in flavanols that increase blood flow to brain 6) turmeric- take with black pepper to increase absorption 7) chamomile tea- antioxidant and anti-inflammatory properties 8) yogurt without sugar- supports gut-brain axis 9) green tea- contains L- theanine 10) blueberries- high in vitamin C and antioxidants 11) [place]- high in tryptophan which gets converted to serotonin 12) bell peppers- rich in vitamin C and antioxidants 13) citrus fruits- rich in vitamin C and antioxidants 14) almonds- high in vitamin E and healthy fats 15) chia seeds- high  in omega-3 fatty acids

## 2020-12-01 DIAGNOSIS — M5134 Other intervertebral disc degeneration, thoracic region: Secondary | ICD-10-CM | POA: Diagnosis not present

## 2020-12-01 DIAGNOSIS — M62830 Muscle spasm of back: Secondary | ICD-10-CM | POA: Diagnosis not present

## 2020-12-01 DIAGNOSIS — M5414 Radiculopathy, thoracic region: Secondary | ICD-10-CM | POA: Diagnosis not present

## 2020-12-22 ENCOUNTER — Other Ambulatory Visit: Payer: Self-pay

## 2020-12-22 DIAGNOSIS — R6889 Other general symptoms and signs: Secondary | ICD-10-CM

## 2020-12-22 LAB — POCT INFLUENZA A/B
Influenza A, POC: NEGATIVE
Influenza B, POC: NEGATIVE

## 2020-12-22 NOTE — Progress Notes (Signed)
Pt wife tested positive yesterday with FLU. Symptoms started this morning, sore throat, low grade fever, and body aches.

## 2020-12-24 ENCOUNTER — Encounter: Payer: Self-pay | Admitting: Physician Assistant

## 2020-12-24 ENCOUNTER — Ambulatory Visit: Payer: Self-pay | Admitting: Physician Assistant

## 2020-12-24 ENCOUNTER — Other Ambulatory Visit: Payer: Self-pay

## 2020-12-24 DIAGNOSIS — R6889 Other general symptoms and signs: Secondary | ICD-10-CM

## 2020-12-24 DIAGNOSIS — J09X2 Influenza due to identified novel influenza A virus with other respiratory manifestations: Secondary | ICD-10-CM

## 2020-12-24 LAB — POC COVID19 BINAXNOW: SARS Coronavirus 2 Ag: NEGATIVE

## 2020-12-24 LAB — POCT INFLUENZA A/B
Influenza A, POC: POSITIVE — AB
Influenza B, POC: NEGATIVE

## 2020-12-24 MED ORDER — IBUPROFEN 800 MG PO TABS: 800.0000 mg | ORAL_TABLET | Freq: Three times a day (TID) | ORAL | 0 refills | Status: DC | PRN

## 2020-12-24 MED ORDER — PSEUDOEPH-BROMPHEN-DM 30-2-10 MG/5ML PO SYRP: 5.0000 mL | ORAL_SOLUTION | Freq: Four times a day (QID) | ORAL | 0 refills | Status: DC | PRN

## 2020-12-24 MED ORDER — OSELTAMIVIR PHOSPHATE 75 MG PO CAPS: 75.0000 mg | ORAL_CAPSULE | Freq: Two times a day (BID) | ORAL | 0 refills | Status: DC

## 2020-12-24 NOTE — Addendum Note (Signed)
Addended by: Christianne Dolin F on: 12/24/2020 02:01 PM   Modules accepted: Orders

## 2020-12-24 NOTE — Addendum Note (Signed)
Addended by: Christianne Dolin F on: 12/24/2020 01:52 PM   Modules accepted: Orders

## 2020-12-24 NOTE — Progress Notes (Signed)
° °  Subjective: Viral illness    Patient ID: Chase Stewart, male    DOB: 25-Jun-1985, 35 y.o.   MRN: [id number]  HPI Patient complains of nasal congestion, nonproductive cough, body aches, chills, and fever.  Patient tested positive for influenza today.  Patient tested negative for COVID-19.  Patient is not taking the flu vaccine.   Review of Systems Negative except for chief complaint    Objective:   Physical Exam  This is a virtual visit.      Assessment & Plan: Influenza.   Patient given a prescription for Tamiflu, Bromfed-DM, and ibuprofen.

## 2020-12-24 NOTE — Progress Notes (Addendum)
Pt presents today for retest for flu. Now receiving covid rapid and PCR to confirm.  Patient has vomited over the past 2-3 days with temp of 102, cough and congestion.

## 2020-12-24 NOTE — Progress Notes (Signed)
Patient has vomited over the past 2-3 days with temp of 102, cough and congestion.

## 2020-12-24 NOTE — Addendum Note (Signed)
Addended by: Christianne Dolin F on: 12/24/2020 02:09 PM   Modules accepted: Orders

## 2021-02-03 ENCOUNTER — Ambulatory Visit (INDEPENDENT_AMBULATORY_CARE_PROVIDER_SITE_OTHER): Payer: 59 | Admitting: Internal Medicine

## 2021-02-19 ENCOUNTER — Other Ambulatory Visit: Payer: Self-pay | Admitting: Physical Medicine and Rehabilitation

## 2021-02-19 DIAGNOSIS — F419 Anxiety disorder, unspecified: Secondary | ICD-10-CM

## 2021-02-23 ENCOUNTER — Other Ambulatory Visit: Payer: Self-pay

## 2021-02-23 DIAGNOSIS — Z1152 Encounter for screening for COVID-19: Secondary | ICD-10-CM

## 2021-02-23 LAB — POC COVID19 BINAXNOW: SARS Coronavirus 2 Ag: NEGATIVE

## 2021-02-23 LAB — POCT INFLUENZA A/B
Influenza A, POC: NEGATIVE
Influenza B, POC: NEGATIVE

## 2021-02-23 NOTE — Progress Notes (Signed)
Pt received rapid covid and flu with PCR COVID.

## 2021-02-24 LAB — NOVEL CORONAVIRUS, NAA: SARS-CoV-2, NAA: NOT DETECTED

## 2021-03-09 ENCOUNTER — Other Ambulatory Visit: Payer: Self-pay

## 2021-03-09 DIAGNOSIS — Z8619 Personal history of other infectious and parasitic diseases: Secondary | ICD-10-CM

## 2021-03-09 MED ORDER — VALACYCLOVIR HCL 1 G PO TABS: ORAL_TABLET | ORAL | 2 refills | Status: DC

## 2021-03-11 ENCOUNTER — Telehealth: Payer: Self-pay | Admitting: Nurse Practitioner

## 2021-03-11 NOTE — Telephone Encounter (Signed)
Patient was called today to be reminded that he needs repeat imaging of his chest to monitor lung nodules.  I am no longer his primary care provider as at my previous office close down.  He was encouraged to either reestablish care with me so that imaging could be ordered or to discuss this with his current PCP.  He did mention that he has a PCP in [place] and will discuss this with them. ?

## 2021-05-21 DIAGNOSIS — M546 Pain in thoracic spine: Secondary | ICD-10-CM | POA: Diagnosis not present

## 2021-06-17 DIAGNOSIS — M5134 Other intervertebral disc degeneration, thoracic region: Secondary | ICD-10-CM | POA: Diagnosis not present

## 2021-06-17 DIAGNOSIS — R69 Illness, unspecified: Secondary | ICD-10-CM | POA: Diagnosis not present

## 2021-06-17 DIAGNOSIS — F419 Anxiety disorder, unspecified: Secondary | ICD-10-CM | POA: Insufficient documentation

## 2021-07-07 DIAGNOSIS — M5414 Radiculopathy, thoracic region: Secondary | ICD-10-CM | POA: Diagnosis not present

## 2021-07-22 DIAGNOSIS — M9901 Segmental and somatic dysfunction of cervical region: Secondary | ICD-10-CM | POA: Diagnosis not present

## 2021-07-22 DIAGNOSIS — M5414 Radiculopathy, thoracic region: Secondary | ICD-10-CM | POA: Diagnosis not present

## 2021-07-22 DIAGNOSIS — M62830 Muscle spasm of back: Secondary | ICD-10-CM | POA: Diagnosis not present

## 2021-07-22 DIAGNOSIS — M9902 Segmental and somatic dysfunction of thoracic region: Secondary | ICD-10-CM | POA: Diagnosis not present

## 2021-07-27 DIAGNOSIS — M62830 Muscle spasm of back: Secondary | ICD-10-CM | POA: Diagnosis not present

## 2021-07-27 DIAGNOSIS — M9902 Segmental and somatic dysfunction of thoracic region: Secondary | ICD-10-CM | POA: Diagnosis not present

## 2021-07-27 DIAGNOSIS — M5414 Radiculopathy, thoracic region: Secondary | ICD-10-CM | POA: Diagnosis not present

## 2021-07-27 DIAGNOSIS — M9901 Segmental and somatic dysfunction of cervical region: Secondary | ICD-10-CM | POA: Diagnosis not present

## 2021-07-30 ENCOUNTER — Telehealth: Payer: Self-pay

## 2021-07-30 NOTE — Telephone Encounter (Signed)
We have received records from [facility] on this patient, however as I was reviewing these the last PT notes I see are from February 2022. Is this the last time he did PT?  Also the last MRI I see is from March 2022. If this is correct as well, please schedule him with Danielle.   We will also need a MRI disc with his MRI and any xrays that were completed.

## 2021-07-30 NOTE — Telephone Encounter (Signed)
Images have been requested from [facility].

## 2021-08-03 DIAGNOSIS — M5414 Radiculopathy, thoracic region: Secondary | ICD-10-CM | POA: Diagnosis not present

## 2021-08-03 DIAGNOSIS — M9901 Segmental and somatic dysfunction of cervical region: Secondary | ICD-10-CM | POA: Diagnosis not present

## 2021-08-03 DIAGNOSIS — M9902 Segmental and somatic dysfunction of thoracic region: Secondary | ICD-10-CM | POA: Diagnosis not present

## 2021-08-03 DIAGNOSIS — M62830 Muscle spasm of back: Secondary | ICD-10-CM | POA: Diagnosis not present

## 2021-08-04 DIAGNOSIS — M5134 Other intervertebral disc degeneration, thoracic region: Secondary | ICD-10-CM | POA: Diagnosis not present

## 2021-08-04 DIAGNOSIS — G580 Intercostal neuropathy: Secondary | ICD-10-CM | POA: Diagnosis not present

## 2021-08-04 DIAGNOSIS — R69 Illness, unspecified: Secondary | ICD-10-CM | POA: Diagnosis not present

## 2021-08-16 NOTE — Telephone Encounter (Signed)
CD here at the office. Appt with Danielle on 09/09/2021.

## 2021-08-24 ENCOUNTER — Other Ambulatory Visit: Payer: Self-pay

## 2021-08-24 ENCOUNTER — Ambulatory Visit
Admission: RE | Admit: 2021-08-24 | Discharge: 2021-08-24 | Disposition: A | Discharge status: Home / Self Care | Payer: Self-pay | Source: Ambulatory Visit | Attending: Neurosurgery | Admitting: Neurosurgery

## 2021-08-24 DIAGNOSIS — Z049 Encounter for examination and observation for unspecified reason: Secondary | ICD-10-CM

## 2021-08-24 NOTE — Telephone Encounter (Signed)
CD uploaded.

## 2021-09-01 DIAGNOSIS — M5414 Radiculopathy, thoracic region: Secondary | ICD-10-CM | POA: Diagnosis not present

## 2021-09-09 ENCOUNTER — Encounter: Payer: Self-pay | Admitting: Neurosurgery

## 2021-09-09 ENCOUNTER — Ambulatory Visit (INDEPENDENT_AMBULATORY_CARE_PROVIDER_SITE_OTHER): Payer: 59 | Admitting: Neurosurgery

## 2021-09-09 VITALS — BP 108/64 | HR 69 | Ht 69.0 in | Wt 150.2 lb

## 2021-09-09 DIAGNOSIS — M546 Pain in thoracic spine: Secondary | ICD-10-CM

## 2021-09-09 DIAGNOSIS — G8929 Other chronic pain: Secondary | ICD-10-CM | POA: Diagnosis not present

## 2021-09-09 MED ORDER — BACLOFEN 10 MG PO TABS: 10.0000 mg | ORAL_TABLET | Freq: Every evening | ORAL | 1 refills | Status: AC

## 2021-09-09 NOTE — Progress Notes (Signed)
Referring Physician:  Domingo Mend [address] [place],  [place] [address]  Primary Physician:  Elenore Paddy, NP  History of Present Illness: 09/09/2021 [name] is here today with a chief complaint of interscapular pain for the last 2 years without an particular inciting event.  He states it started about 2 years ago primarily on the left side and radiated around his chest wall.  He underwent significant work-up for Lyme's disease, cardiovascular work-up, and multiple blood tests that were ultimately all within normal limits.  He states that since then the radiating pain has stopped however he continues to have pain off to the left in between the spine and scapula that is worse with movement.  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  Over the last 6 months he has began to have similar symptoms on the right.  He has undergone massage and formal physical therapy in February 2020 as well as epidural injections and trigger point injections with relief for about a day to 4 days depending on the injection.  He had an intercostal nerve shot about 2 or 3 weeks ago which gave him 4 days of relief.  He denies any numbness, tingling, or radicular symptoms as well as any weakness.  Conservative measures:  Physical therapy: in 2022 without consistency and doubt significant relief. Multimodal medical therapy including regular antiinflammatories: Mobic, steroids, muscle relaxers, and other anti-inflammatory medications. Injections: As above  Past Surgery: No previous spinal surgeries  SHASHWAT CLEARY has no symptoms of cervical myelopathy.  The symptoms are causing a significant impact on the patient's life.   Review of Systems:  A 10 point review of systems is negative, except for the pertinent positives and negatives detailed in the HPI.  Past Medical History: Past Medical History:  Diagnosis Date   Anxiety    Chronic kidney disease     h/o kidney stones   Depression    Migraines     Past Surgical History: Past Surgical History:  Procedure Laterality Date   ROTATOR CUFF REPAIR Right    SHOULDER ARTHROSCOPY WITH OPEN ROTATOR CUFF REPAIR AND DISTAL CLAVICLE ACROMINECTOMY Left 05/26/2015   Procedure: SHOULDER ARTHROSCOPY WITH ANTERIOR AND SUPERIOR LABRAL REPAIR;  Surgeon: Juanell Fairly, MD;  Location: [facility] OR[facility];  Service: Orthopedics;  Laterality: Left;   TONSILLECTOMY     TYMPANOSTOMY TUBE PLACEMENT      Allergies: Allergies as of 09/09/2021   (No Known Allergies)    Medications: Outpatient Encounter Medications as of 09/09/2021  Medication Sig   ALPHA LIPOIC ACID PO Take by mouth daily.   ibuprofen (ADVIL) 200 MG tablet Take 400-600 mg by mouth as needed.   TURMERIC PO Take by mouth daily.   lidocaine (LIDODERM) 5 % Place 1 patch onto the skin daily. Remove & Discard patch within 12 hours or as directed by MD   [DISCONTINUED] brompheniramine-pseudoephedrine-DM 30-2-10 MG/5ML syrup Take 5 mLs by mouth 4 (four) times daily as needed.   [DISCONTINUED] escitalopram (LEXAPRO) 5 MG tablet Take 1 tablet (5 mg total) by mouth daily.   [DISCONTINUED] gabapentin (NEURONTIN) 100 MG capsule Take 1 capsule (100 mg total) by mouth 3 (three) times daily as needed.   [DISCONTINUED] ibuprofen (ADVIL) 800 MG tablet Take 1 tablet (800 mg total) by mouth every 8 (eight) hours as needed for moderate pain.   [DISCONTINUED] oseltamivir (TAMIFLU) 75 MG capsule Take 1 capsule (75 mg total) by mouth 2 (two) times  daily.   [DISCONTINUED] valACYclovir (VALTREX) 1000 MG tablet 2 tabs po bid x1 day   No facility-administered encounter medications on file as of 09/09/2021.    Social History: Social History   Tobacco Use   Smoking status: Never   Smokeless tobacco: Never  Vaping Use   Vaping Use: Never used  Substance Use Topics   Alcohol use: No   Drug use: Not Currently    Types: Marijuana    Family Medical History: Family  History  Problem Relation Age of Onset   Hyperthyroidism Father    CAD Father     Physical Examination:  Today's Vitals   09/09/21 1012  BP: 108/64  Pulse: 69  Weight: 68.1 kg  Height: 5\' 9"  (1.753 m)  PainSc: 7   PainLoc: Back   Body mass index is 22.18 kg/m.   General: Patient is well developed, well nourished, calm, collected, and in no apparent distress. Attention to examination is appropriate.  Psychiatric: Patient is non-anxious.  Head:  Pupils equal, round, and reactive to light.  ENT:  Oral mucosa appears well hydrated.  Neck:   Supple.  Full range of motion.  Respiratory: Patient is breathing without any difficulty.  Extremities: No edema.  Vascular: Palpable dorsal pedal pulses.  Skin:   On exposed skin, there are no abnormal skin lesions.  NEUROLOGICAL:     Awake, alert, oriented to person, place, and time.  Speech is clear and fluent. Fund of knowledge is appropriate.   Cranial Nerves: Pupils equal round and reactive to light.  ROM of spine: full.  Palpation of spine: TTP of left paraspinal and interscapular muscles  Strength: Side Biceps Triceps Deltoid Interossei Grip Wrist Ext. Wrist Flex.  R 5 5 5 5 5 5 5   L 5 5 5 5 5 5 5    Side Iliopsoas Quads Hamstring PF DF EHL  R 5 5 5 5 5 5   L 5 5 5 5 5 5    Reflexes are 2+ and symmetric at the biceps, triceps, brachioradialis, patella and achilles.   Hoffman's is absent.  Clonus is not present.  Toes are down-going.  Bilateral upper and lower extremity sensation is intact to light touch.    Gait is normal and independent  Medical Decision Making  Imaging: Thoracic MRI 03/18/20 Small protrusions at multiple levels within the thoracic spine without any significant central canal or neuroforaminal stenosis.  Assessment and Plan: Mr. Marcella is a pleasant 36 y.o. male with 2 years of thoracic back pain.  His symptoms are most consistent with a musculoskeletal problem and are reproducible on exam.  He has  not been consistent with any treatment.  I recommended formal physical therapy with a focus on myofascial release and dry needling.  I have referred [facility] to [facility] for this. I have also provided him with a prescription for baclofen and encouraged him to take this nightly for at least 6 weeks.  He will keep updated on how he is responding to treatment.  Thank you for involving me in the care of this patient.   I spent a total of 31 minutes in both face-to-face and non-face-to-face activities for this visit on the date of this encounter including review of outside records, review of imaging, review of symptoms, physical exam, discussion of differential diagnosis, discussion of treatment options, and documentation.   Dept. of Neurosurgery

## 2021-09-12 IMAGING — CT CT ANGIO NECK
2 of 11 series · 6 of 35 positions shown · IV contrast (Omnipaque or Isovue)
Comparison: Head MRI 04/15/2003

CLINICAL DATA: Headache and neck pain with blurry vision.

EXAM:
CT ANGIOGRAPHY HEAD AND NECK
TECHNIQUE: Multidetector CT imaging of the head and neck was performed using
the standard protocol during bolus administration of intravenous
contrast. Multiplanar CT image reconstructions and MIPs were
obtained to evaluate the vascular anatomy. Carotid stenosis
measurements (when applicable) are obtained utilizing NASCET
criteria, using the distal internal carotid diameter as the
denominator.
CONTRAST:  75mL OMNIPAQUE IOHEXOL 350 MG/ML SOLN

[Series 10: ax thins · axial · 0.36mm/px · z∈[-125,+112]mm · 5 of 360 slices shown]
[im 60/360  soft-tissue]
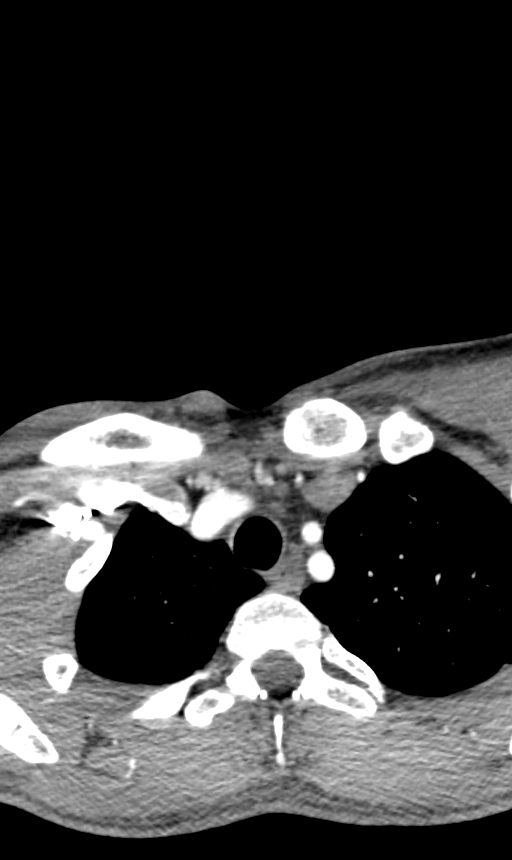
[im 120/360  bone]
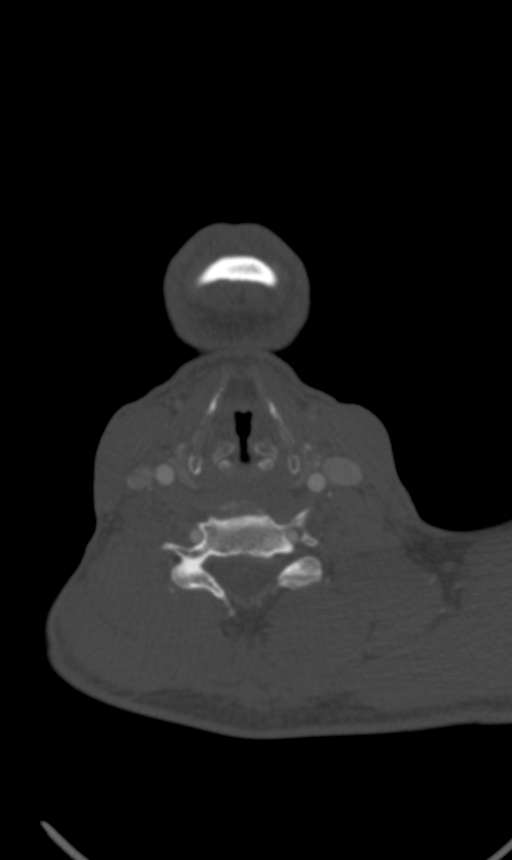
[im 180/360  soft-tissue]
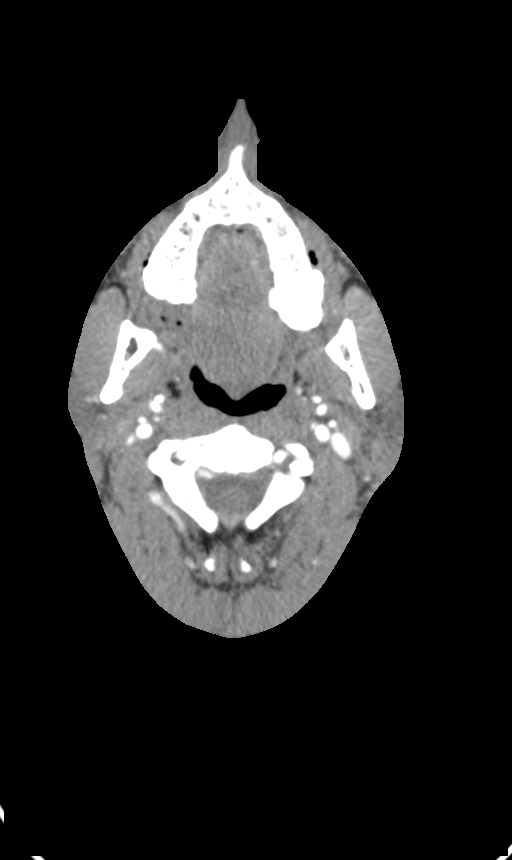
[im 240/360  bone]
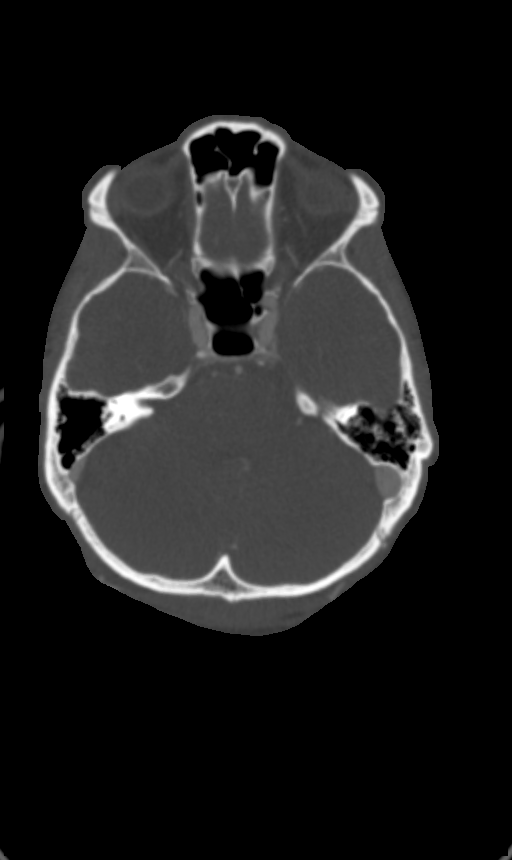
[im 300/360  soft-tissue]
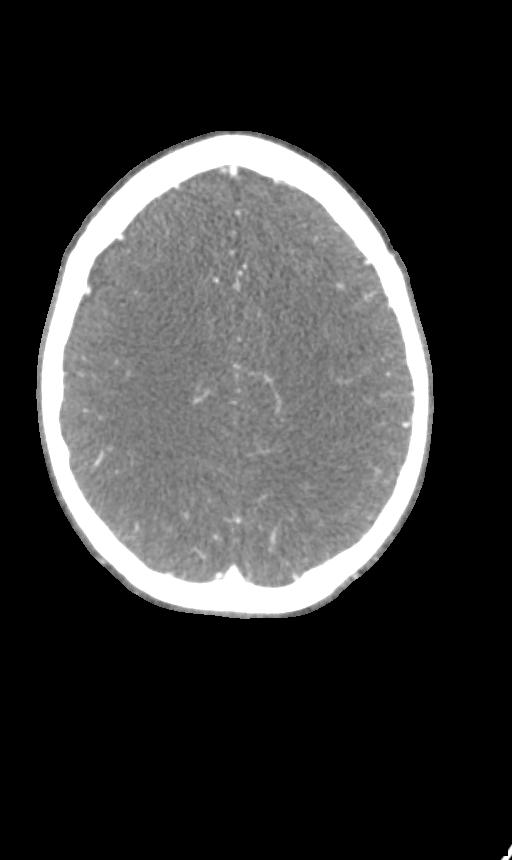

[Series 12: sag thin · sagittal · 0.58mm/px · 1 of 203 slices shown]
[im 107/203  soft-tissue]
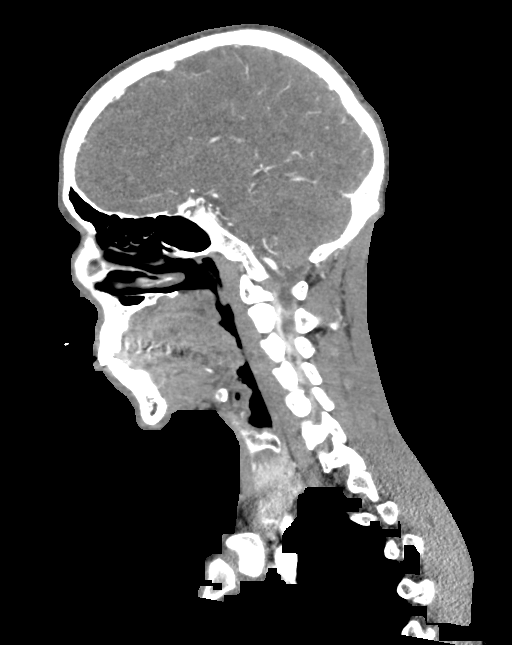

[6 of 35 positions shown; findings below may reference images not displayed]

FINDINGS: CT HEAD FINDINGS

Brain: There is no evidence of an acute infarct, intracranial
hemorrhage, mass, midline shift, or extra-axial fluid collection.
The ventricles and sulci are normal.

Vascular: No hyperdense vessel.

Skull: No fracture or suspicious osseous lesion.

Sinuses: Visualized paranasal sinuses and mastoid air cells are
clear.

Orbits: Unremarkable.

Review of the MIP images confirms the above findings

CTA NECK FINDINGS

Aortic arch: Standard 3 vessel aortic arch with widely patent arch
vessel origins.

Right carotid system: Patent and smooth without evidence of stenosis
or dissection.

Left carotid system: Patent and smooth without evidence of stenosis
or dissection.

Vertebral arteries: Patent and smooth without evidence of stenosis
or dissection. Codominant.

Skeleton: No acute osseous abnormality or suspicious osseous lesion.
Mild disc degeneration at C6-7.

Other neck: No evidence of cervical lymphadenopathy or mass.

Upper chest: Clear lung apices.

Review of the MIP images confirms the above findings

CTA HEAD FINDINGS

Anterior circulation: The internal carotid arteries are widely
patent from skull base to carotid termini. ACAs and MCAs are patent
without evidence of a proximal branch occlusion or significant
proximal stenosis. No aneurysm is identified.

Posterior circulation: The intracranial vertebral arteries are
widely patent to the basilar. Patent PICA and SCA origins are seen
bilaterally. The basilar artery is widely patent. There are right
larger than left posterior communicating arteries. Both PCAs are
patent without evidence of a significant proximal stenosis. No
aneurysm is identified.

Venous sinuses: Patent.

Anatomic variants: None.

Review of the MIP images confirms the above findings
IMPRESSION: 1. Unremarkable CT appearance of the brain. No evidence of acute
intracranial abnormality.
2. No large vessel occlusion, significant stenosis, or aneurysm in
the head and neck.

## 2021-10-06 ENCOUNTER — Other Ambulatory Visit: Payer: 59

## 2021-10-06 DIAGNOSIS — Z20822 Contact with and (suspected) exposure to covid-19: Secondary | ICD-10-CM

## 2021-10-06 DIAGNOSIS — J329 Chronic sinusitis, unspecified: Secondary | ICD-10-CM

## 2021-10-06 LAB — POC COVID19 BINAXNOW: SARS Coronavirus 2 Ag: NEGATIVE

## 2021-10-06 NOTE — Progress Notes (Signed)
Requested covid test for 4 days of URI allergy symptoms and today another household member tested positive for covid.  Test performed in vehicle outside per protocol.  Results called to patient and reported to provider with instructions as test resulted negative.

## 2021-10-08 DIAGNOSIS — M542 Cervicalgia: Secondary | ICD-10-CM | POA: Diagnosis not present

## 2021-10-08 DIAGNOSIS — M546 Pain in thoracic spine: Secondary | ICD-10-CM | POA: Diagnosis not present

## 2021-10-13 DIAGNOSIS — M542 Cervicalgia: Secondary | ICD-10-CM | POA: Diagnosis not present

## 2021-10-13 DIAGNOSIS — M546 Pain in thoracic spine: Secondary | ICD-10-CM | POA: Diagnosis not present

## 2021-10-21 NOTE — Progress Notes (Signed)
Referring Physician:  No referring provider defined for this encounter.  Primary Physician:  Ailene Ards, NP  History of Present Illness: 10/21/2021 Chase Stewart is here today for a f/u of thoracic pain. Previous MRI of thoracic spine 03/18/20 showed small protrusions at multiple levels without any central or foraminal stenosis.   Danielle felt his pain was more muscular in nature. He was sent to PT with focus on myofascial release and dry needling ([facility]) and he was given baclofen.   He continues with pain in scapular region along with more numbness and tingling on left > right that has gotten worse. He has been to PT twice a week since 09/28/21. 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 He has no radiation of pain around the chest. No pain in his arms or legs. No weakness noted.   Conservative measures:  Physical therapy: going now 2 x per week since 09/28/21. Doing dry needling.  Multimodal medical therapy including regular antiinflammatories: Mobic, steroids, muscle relaxers, and other anti-inflammatory medications. Injections:  - epidural injections and trigger point injections with relief for about a day to 4 days depending on the injection - He had an intercostal nerve shot previously which gave him 4 days of relief.  Past Surgery: No previous spinal surgeries  Chase Stewart has no symptoms of cervical myelopathy.  The symptoms are causing a significant impact on the patient's life.   Review of Systems:  A 10 point review of systems is negative, except for the pertinent positives and negatives detailed in the HPI.  Past Medical History: Past Medical History:  Diagnosis Date   Anxiety    Chronic kidney disease    h/o kidney stones   Depression    Migraines     Past Surgical History: Past Surgical History:  Procedure Laterality Date   ROTATOR CUFF REPAIR Right    SHOULDER ARTHROSCOPY WITH OPEN ROTATOR CUFF REPAIR AND DISTAL CLAVICLE  ACROMINECTOMY Left 05/26/2015   Procedure: SHOULDER ARTHROSCOPY WITH ANTERIOR AND SUPERIOR LABRAL REPAIR;  Surgeon: Thornton Park, MD;  Location: [facility] OR[facility];  Service: Orthopedics;  Laterality: Left;   TONSILLECTOMY     TYMPANOSTOMY TUBE PLACEMENT      Allergies: Allergies as of 10/22/2021   (No Known Allergies)    Medications: Outpatient Encounter Medications as of 10/22/2021  Medication Sig   ALPHA LIPOIC ACID PO Take by mouth daily.   baclofen (LIORESAL) 10 MG tablet Take 1 tablet (10 mg total) by mouth at bedtime.   ibuprofen (ADVIL) 200 MG tablet Take 400-600 mg by mouth as needed.   lidocaine (LIDODERM) 5 % Place 1 patch onto the skin daily. Remove & Discard patch within 12 hours or as directed by MD   TURMERIC PO Take by mouth daily.   No facility-administered encounter medications on file as of 10/22/2021.    Social History: Social History   Tobacco Use   Smoking status: Never   Smokeless tobacco: Never  Vaping Use   Vaping Use: Never used  Substance Use Topics   Alcohol use: No   Drug use: Not Currently    Types: Marijuana    Family Medical History: Family History  Problem Relation Age of Onset   Hyperthyroidism Father    CAD Father     Physical Examination:  There were no vitals filed for this visit.  There is no height or weight on file to calculate BMI.   General: Patient is well developed, well  nourished, calm, collected, and in no apparent distress. Attention to examination is appropriate.   NEUROLOGICAL:     Awake, alert, oriented to person, place, and time.  Speech is clear and fluent. Fund of knowledge is appropriate.   He has tenderness inferior border of left scapula. No palpable trigger point. No point tenderness over thoracic spine.   Strength: Side Biceps Triceps Deltoid Interossei Grip Wrist Ext. Wrist Flex.  R 5 5 5 5 5 5 5   L 5 5 5 5 5 5 5    Side Iliopsoas Quads Hamstring PF DF EHL  R 5 5 5 5 5 5   L 5 5 5 5 5 5    Reflexes  are 2+ and symmetric at the biceps, triceps, brachioradialis, patella and achilles.   Hoffman's is absent.  Clonus is not present.   Bilateral upper and lower extremity sensation is intact to light touch.    Gait is normal and independent  Medical Decision Making  Imaging: Nothing new to review  Assessment and Plan: [name] is a pleasant 36 y.o. male with 2 years of thoracic back pain.  His symptoms continue to be most consistent with a musculoskeletal problem. He has noted increased numbness/tingling in left > right scapular region.   Previous thoracic MRI 03/2020 showed small protrusions at multiple levels without any central or foraminal stenosis.   He is doing PT and dry needling currently. No significant relief with previous trigger point injections, epidural injection, or intercostal block (done at [facility]).   Treatment options reviewed with patient and following plan made:   - Continue with PT and dry needling.  - Discussed that increased numbness/tingling may be due to inflammatory response from dry needling.  - Continue on prn baclofen from [place].  - Will review with Dr. Dan Humphreys regarding further treatment options and send him a message.   I spent a total of 20 minutes in both face-to-face and non-face-to-face activities for this visit on the date of this encounter including review of outside records, review of imaging, review of symptoms, physical exam, discussion of differential diagnosis, discussion of treatment options, and documentation.   20 PA-C Dept. of Neurosurgery

## 2021-10-22 ENCOUNTER — Encounter: Payer: Self-pay | Admitting: Orthopedic Surgery

## 2021-10-22 ENCOUNTER — Ambulatory Visit (INDEPENDENT_AMBULATORY_CARE_PROVIDER_SITE_OTHER): Payer: 59 | Admitting: Orthopedic Surgery

## 2021-10-22 VITALS — BP 114/70 | HR 61 | Ht 69.0 in | Wt 150.0 lb

## 2021-10-22 DIAGNOSIS — G8929 Other chronic pain: Secondary | ICD-10-CM

## 2021-10-22 DIAGNOSIS — M7918 Myalgia, other site: Secondary | ICD-10-CM | POA: Diagnosis not present

## 2021-10-22 DIAGNOSIS — M546 Pain in thoracic spine: Secondary | ICD-10-CM | POA: Diagnosis not present

## 2021-10-25 DIAGNOSIS — M542 Cervicalgia: Secondary | ICD-10-CM | POA: Diagnosis not present

## 2021-10-25 DIAGNOSIS — M546 Pain in thoracic spine: Secondary | ICD-10-CM | POA: Diagnosis not present

## 2021-10-27 DIAGNOSIS — M542 Cervicalgia: Secondary | ICD-10-CM | POA: Diagnosis not present

## 2021-10-27 DIAGNOSIS — M546 Pain in thoracic spine: Secondary | ICD-10-CM | POA: Diagnosis not present

## 2021-11-03 DIAGNOSIS — M542 Cervicalgia: Secondary | ICD-10-CM | POA: Diagnosis not present

## 2021-11-03 DIAGNOSIS — M546 Pain in thoracic spine: Secondary | ICD-10-CM | POA: Diagnosis not present

## 2021-11-05 ENCOUNTER — Other Ambulatory Visit: Payer: Self-pay

## 2021-11-05 DIAGNOSIS — M546 Pain in thoracic spine: Secondary | ICD-10-CM | POA: Diagnosis not present

## 2021-11-05 DIAGNOSIS — M542 Cervicalgia: Secondary | ICD-10-CM | POA: Diagnosis not present

## 2021-11-06 MED ORDER — VALACYCLOVIR HCL 1 G PO TABS: 1000.0000 mg | ORAL_TABLET | Freq: Two times a day (BID) | ORAL | 0 refills | Status: DC

## 2021-11-11 DIAGNOSIS — M546 Pain in thoracic spine: Secondary | ICD-10-CM | POA: Diagnosis not present

## 2021-11-11 DIAGNOSIS — M542 Cervicalgia: Secondary | ICD-10-CM | POA: Diagnosis not present

## 2021-11-15 IMAGING — DX DG CHEST 1V PORT
1 series · 1 of 1 positions shown · non-contrast
Comparison: None.

CLINICAL DATA: Chest pain.

EXAM:
PORTABLE CHEST 1 VIEW

[chest ap]
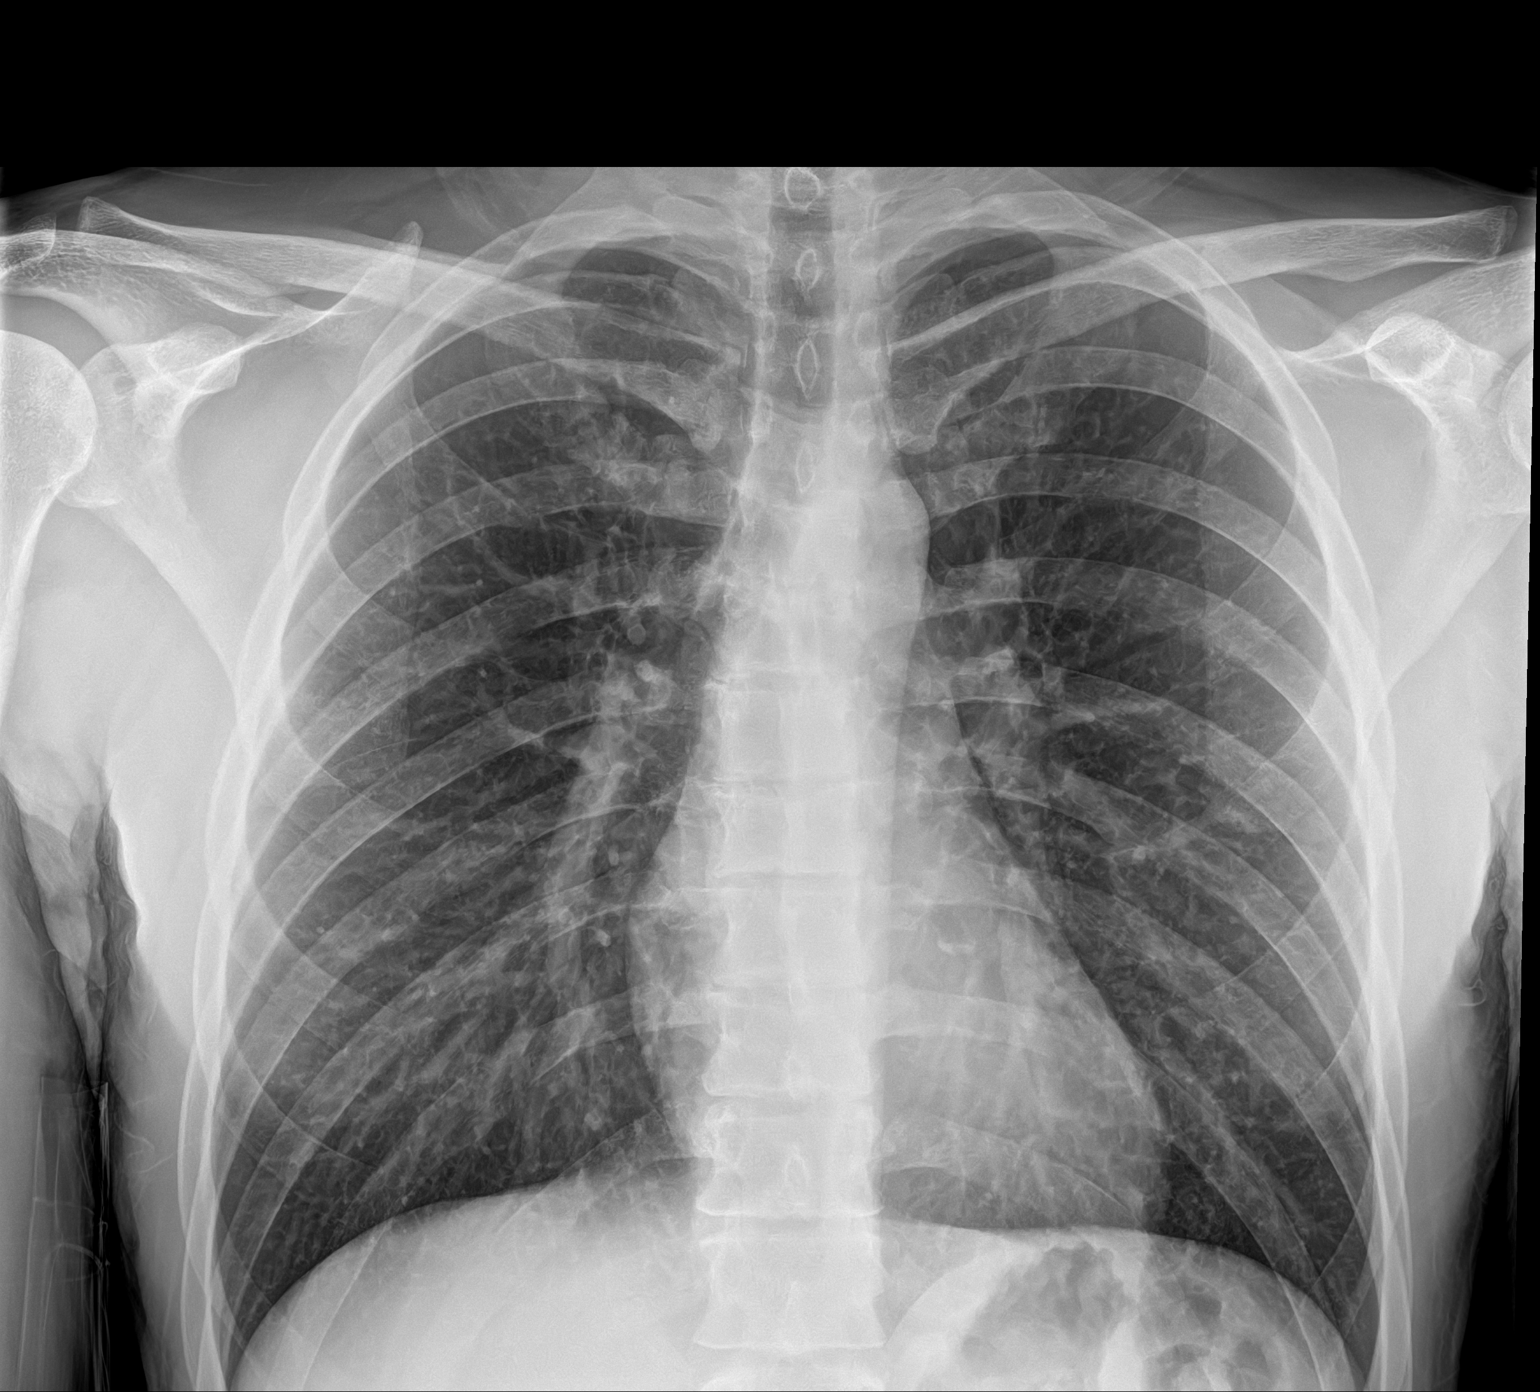

[1 of 1 positions shown; findings below may reference images not displayed]

FINDINGS: The heart size and mediastinal contours are within normal limits.
Both lungs are clear. The visualized skeletal structures are
unremarkable.
IMPRESSION: No active disease.

## 2021-12-08 NOTE — Progress Notes (Signed)
Referring Physician:  Elenore Paddy, NP [address] [place],  [place] [address]  Primary Physician:  Elenore Paddy, NP  History of Present Illness: Chase Stewart is here today for a f/u of thoracic pain. Previous MRI of thoracic spine 03/18/20 showed small protrusions at multiple levels without any central or foraminal stenosis.    Last seen by me on 10/22/21 for his thoracic pain. He was to continue with PT. Dr. Myer Haff did not recommend any further thoracic imaging. If no improvement, may consider referral to chiropractor for manipulation. Patient stated he has been to chiropractor in the past with no improvement.    His pain seems to have localized to below his left scapula in rib cage region. He has constant numbness/tingling in this area as well that he feels like moves in a 2 inch vertical region. No significant pain in right side. No radiation of pain around his rib cage or in his arms.   His PCP has started him on naltrexone for pain. He has not noted significant relief with it. No relief with PT.    Conservative measures:  Physical therapy: No relief from PT at [facility]. No improvement with dry needling.  Multimodal medical therapy including regular antiinflammatories: Mobic, steroids, muscle relaxers, and other anti-inflammatory medications. On naltrexone.  Injections:  - epidural injections and trigger point injections with relief for about a day to 4 days depending on the injection - He had an intercostal nerve shot previously which gave him 4 days of relief.  Past Surgery: No previous spinal surgeries  Chase Stewart has no symptoms of cervical myelopathy.  The symptoms are causing a significant impact on the patient's life.   Review of Systems:  A 10 point review of systems is negative, except for the pertinent positives and negatives detailed in the HPI.  Past Medical History: Past Medical History:  Diagnosis Date   Anxiety    Chronic kidney disease     h/o kidney stones   Depression    Migraines     Past Surgical History: Past Surgical History:  Procedure Laterality Date   ROTATOR CUFF REPAIR Right    SHOULDER ARTHROSCOPY WITH OPEN ROTATOR CUFF REPAIR AND DISTAL CLAVICLE ACROMINECTOMY Left 05/26/2015   Procedure: SHOULDER ARTHROSCOPY WITH ANTERIOR AND SUPERIOR LABRAL REPAIR;  Surgeon: Juanell Fairly, MD;  Location: [facility] OR[facility];  Service: Orthopedics;  Laterality: Left;   TONSILLECTOMY     TYMPANOSTOMY TUBE PLACEMENT      Allergies: Allergies as of 12/10/2021   (No Known Allergies)    Medications: Outpatient Encounter Medications as of 12/10/2021  Medication Sig   NALTREXONE HCL PO Take by mouth daily.   ALPHA LIPOIC ACID PO Take by mouth daily.   ibuprofen (ADVIL) 200 MG tablet Take 400-600 mg by mouth as needed.   lidocaine (LIDODERM) 5 % Place 1 patch onto the skin daily. Remove & Discard patch within 12 hours or as directed by MD   Omega-3 Fatty Acids (FISH OIL PO) Take 1 capsule by mouth daily.   TURMERIC PO Take by mouth daily.   valACYclovir (VALTREX) 1000 MG tablet Take 1 tablet (1,000 mg total) by mouth 2 (two) times daily.   No facility-administered encounter medications on file as of 12/10/2021.    Social History: Social History   Tobacco Use   Smoking status: Never   Smokeless tobacco: Never  Vaping Use   Vaping Use: Never used  Substance Use Topics   Alcohol use: No  Drug use: Not Currently    Types: Marijuana    Family Medical History: Family History  Problem Relation Age of Onset   Hyperthyroidism Father    CAD Father     Physical Examination:  Today's Vitals   12/10/21 1145  BP: 121/74  Pulse: 78  Weight: 155 lb (70.3 kg)  PainSc: 5   PainLoc: Back    Body mass index is 22.89 kg/m.    Awake, alert, oriented to person, place, and time.  Speech is clear and fluent. Fund of knowledge is appropriate.   He has tenderness a few inches below inferior border of left scapula. No palpable  trigger point. No point tenderness over thoracic spine.   Strength: Side Biceps Triceps Deltoid Interossei Grip Wrist Ext. Wrist Flex.  R 5 5 5 5 5 5 5   L 5 5 5 5 5 5 5     Reflexes are 2+ and symmetric at the biceps, triceps, brachioradialis, patella and achilles.   Hoffman's is absent.  Bilateral upper extremity sensation is intact to light touch.    Gait is normal and independent  Medical Decision Making  Imaging: MRI of thoracic spine dated 11/16/21:  Impression:  No change since the study of 03/18/20.  Mild scoliotic curvature. Mild bulging of discs at T7-T8, T8-T9, T9-T10, and T11-T12. No compressive narrowing of the canal or foramina. At the T6-T7 level, there are endplate osteophytes in association with the disc bulge resulting in narrowing of the ventral subarachnoid space but no compressive effect upon cord or foraminal extension.   I reviewed images and agree with above reading. Report scanned under media.   Images not able to be uploaded to chart but we can see under patient imaging tab (epic button).   Assessment and Plan: Chase Stewart is a pleasant 36 y.o. male with 2 years of thoracic back pain.  His pain seems to have localized to below his left scapula in rib cage region. He has constant numbness/tingling in this area as well that he feels like moves in a 2 inch vertical region. No significant pain in right side. No radiation of pain around his rib cage or in his arms.   Updated thoracic MRI 11/16/21 showed small protrusions at multiple levels without any central or foraminal stenosis.   No relief with multiple modalities (seep HPI).   Treatment options reviewed with Dr. 20 and following plan made with patient:  - Refer to Dr. 13/7/23 at [facility].  - Hold on further PT if not helping.  - Follow up with me prn.   Landry Mellow PA-C Dept. of Neurosurgery

## 2021-12-10 ENCOUNTER — Ambulatory Visit (INDEPENDENT_AMBULATORY_CARE_PROVIDER_SITE_OTHER): Payer: 59 | Admitting: Orthopedic Surgery

## 2021-12-10 ENCOUNTER — Encounter: Payer: Self-pay | Admitting: Orthopedic Surgery

## 2021-12-10 ENCOUNTER — Other Ambulatory Visit: Payer: Self-pay

## 2021-12-10 ENCOUNTER — Ambulatory Visit
Admission: RE | Admit: 2021-12-10 | Discharge: 2021-12-10 | Disposition: A | Discharge status: Home / Self Care | Payer: Self-pay | Source: Ambulatory Visit | Attending: Orthopedic Surgery | Admitting: Orthopedic Surgery

## 2021-12-10 VITALS — BP 121/74 | HR 78 | Wt 155.0 lb

## 2021-12-10 DIAGNOSIS — Z049 Encounter for examination and observation for unspecified reason: Secondary | ICD-10-CM

## 2021-12-10 DIAGNOSIS — G8929 Other chronic pain: Secondary | ICD-10-CM

## 2021-12-10 DIAGNOSIS — M546 Pain in thoracic spine: Secondary | ICD-10-CM

## 2021-12-10 NOTE — Patient Instructions (Signed)
It was so nice to see you today, I am sorry that you are still hurting so much.   Dr. Myer Haff recommends that you see Dr. Dorthula Nettles at the [facility]. They should call you but you can also call them at [phone].   Please let me know if you don't hear from them.   Call or message me with any questions or concerns.   Kennyth Arnold  [phone]

## 2021-12-20 DIAGNOSIS — R0781 Pleurodynia: Secondary | ICD-10-CM | POA: Diagnosis not present

## 2021-12-22 DIAGNOSIS — M62830 Muscle spasm of back: Secondary | ICD-10-CM | POA: Diagnosis not present

## 2021-12-22 DIAGNOSIS — M6249 Contracture of muscle, multiple sites: Secondary | ICD-10-CM | POA: Diagnosis not present

## 2021-12-22 DIAGNOSIS — M9903 Segmental and somatic dysfunction of lumbar region: Secondary | ICD-10-CM | POA: Diagnosis not present

## 2021-12-22 DIAGNOSIS — S29012A Strain of muscle and tendon of back wall of thorax, initial encounter: Secondary | ICD-10-CM | POA: Diagnosis not present

## 2021-12-22 DIAGNOSIS — M9902 Segmental and somatic dysfunction of thoracic region: Secondary | ICD-10-CM | POA: Diagnosis not present

## 2021-12-22 DIAGNOSIS — M9908 Segmental and somatic dysfunction of rib cage: Secondary | ICD-10-CM | POA: Diagnosis not present

## 2021-12-23 DIAGNOSIS — S29012A Strain of muscle and tendon of back wall of thorax, initial encounter: Secondary | ICD-10-CM | POA: Diagnosis not present

## 2021-12-23 DIAGNOSIS — M9903 Segmental and somatic dysfunction of lumbar region: Secondary | ICD-10-CM | POA: Diagnosis not present

## 2021-12-23 DIAGNOSIS — M9902 Segmental and somatic dysfunction of thoracic region: Secondary | ICD-10-CM | POA: Diagnosis not present

## 2021-12-23 DIAGNOSIS — M9908 Segmental and somatic dysfunction of rib cage: Secondary | ICD-10-CM | POA: Diagnosis not present

## 2021-12-23 DIAGNOSIS — M62830 Muscle spasm of back: Secondary | ICD-10-CM | POA: Diagnosis not present

## 2021-12-23 DIAGNOSIS — M6249 Contracture of muscle, multiple sites: Secondary | ICD-10-CM | POA: Diagnosis not present

## 2021-12-24 DIAGNOSIS — M62830 Muscle spasm of back: Secondary | ICD-10-CM | POA: Diagnosis not present

## 2021-12-24 DIAGNOSIS — M9908 Segmental and somatic dysfunction of rib cage: Secondary | ICD-10-CM | POA: Diagnosis not present

## 2021-12-24 DIAGNOSIS — S29012A Strain of muscle and tendon of back wall of thorax, initial encounter: Secondary | ICD-10-CM | POA: Diagnosis not present

## 2021-12-24 DIAGNOSIS — M6249 Contracture of muscle, multiple sites: Secondary | ICD-10-CM | POA: Diagnosis not present

## 2021-12-24 DIAGNOSIS — M9902 Segmental and somatic dysfunction of thoracic region: Secondary | ICD-10-CM | POA: Diagnosis not present

## 2021-12-24 DIAGNOSIS — M9903 Segmental and somatic dysfunction of lumbar region: Secondary | ICD-10-CM | POA: Diagnosis not present

## 2021-12-29 DIAGNOSIS — M9908 Segmental and somatic dysfunction of rib cage: Secondary | ICD-10-CM | POA: Diagnosis not present

## 2021-12-29 DIAGNOSIS — M62830 Muscle spasm of back: Secondary | ICD-10-CM | POA: Diagnosis not present

## 2021-12-29 DIAGNOSIS — M9902 Segmental and somatic dysfunction of thoracic region: Secondary | ICD-10-CM | POA: Diagnosis not present

## 2021-12-29 DIAGNOSIS — M9903 Segmental and somatic dysfunction of lumbar region: Secondary | ICD-10-CM | POA: Diagnosis not present

## 2021-12-29 DIAGNOSIS — M6249 Contracture of muscle, multiple sites: Secondary | ICD-10-CM | POA: Diagnosis not present

## 2021-12-29 DIAGNOSIS — S29012A Strain of muscle and tendon of back wall of thorax, initial encounter: Secondary | ICD-10-CM | POA: Diagnosis not present

## 2021-12-30 DIAGNOSIS — S29012A Strain of muscle and tendon of back wall of thorax, initial encounter: Secondary | ICD-10-CM | POA: Diagnosis not present

## 2021-12-30 DIAGNOSIS — M9903 Segmental and somatic dysfunction of lumbar region: Secondary | ICD-10-CM | POA: Diagnosis not present

## 2021-12-30 DIAGNOSIS — M62830 Muscle spasm of back: Secondary | ICD-10-CM | POA: Diagnosis not present

## 2021-12-30 DIAGNOSIS — M6249 Contracture of muscle, multiple sites: Secondary | ICD-10-CM | POA: Diagnosis not present

## 2021-12-30 DIAGNOSIS — M9902 Segmental and somatic dysfunction of thoracic region: Secondary | ICD-10-CM | POA: Diagnosis not present

## 2021-12-30 DIAGNOSIS — M9908 Segmental and somatic dysfunction of rib cage: Secondary | ICD-10-CM | POA: Diagnosis not present

## 2021-12-31 DIAGNOSIS — M9903 Segmental and somatic dysfunction of lumbar region: Secondary | ICD-10-CM | POA: Diagnosis not present

## 2021-12-31 DIAGNOSIS — M62830 Muscle spasm of back: Secondary | ICD-10-CM | POA: Diagnosis not present

## 2021-12-31 DIAGNOSIS — S29012A Strain of muscle and tendon of back wall of thorax, initial encounter: Secondary | ICD-10-CM | POA: Diagnosis not present

## 2021-12-31 DIAGNOSIS — M6249 Contracture of muscle, multiple sites: Secondary | ICD-10-CM | POA: Diagnosis not present

## 2021-12-31 DIAGNOSIS — M9902 Segmental and somatic dysfunction of thoracic region: Secondary | ICD-10-CM | POA: Diagnosis not present

## 2021-12-31 DIAGNOSIS — M9908 Segmental and somatic dysfunction of rib cage: Secondary | ICD-10-CM | POA: Diagnosis not present

## 2022-01-02 IMAGING — DX DG THORACIC SPINE 2V
3 series · 3 of 3 positions shown · non-contrast
Comparison: None.

CLINICAL DATA: Upper back pain.

EXAM:
THORACIC SPINE 2 VIEWS

[t-spine ap]
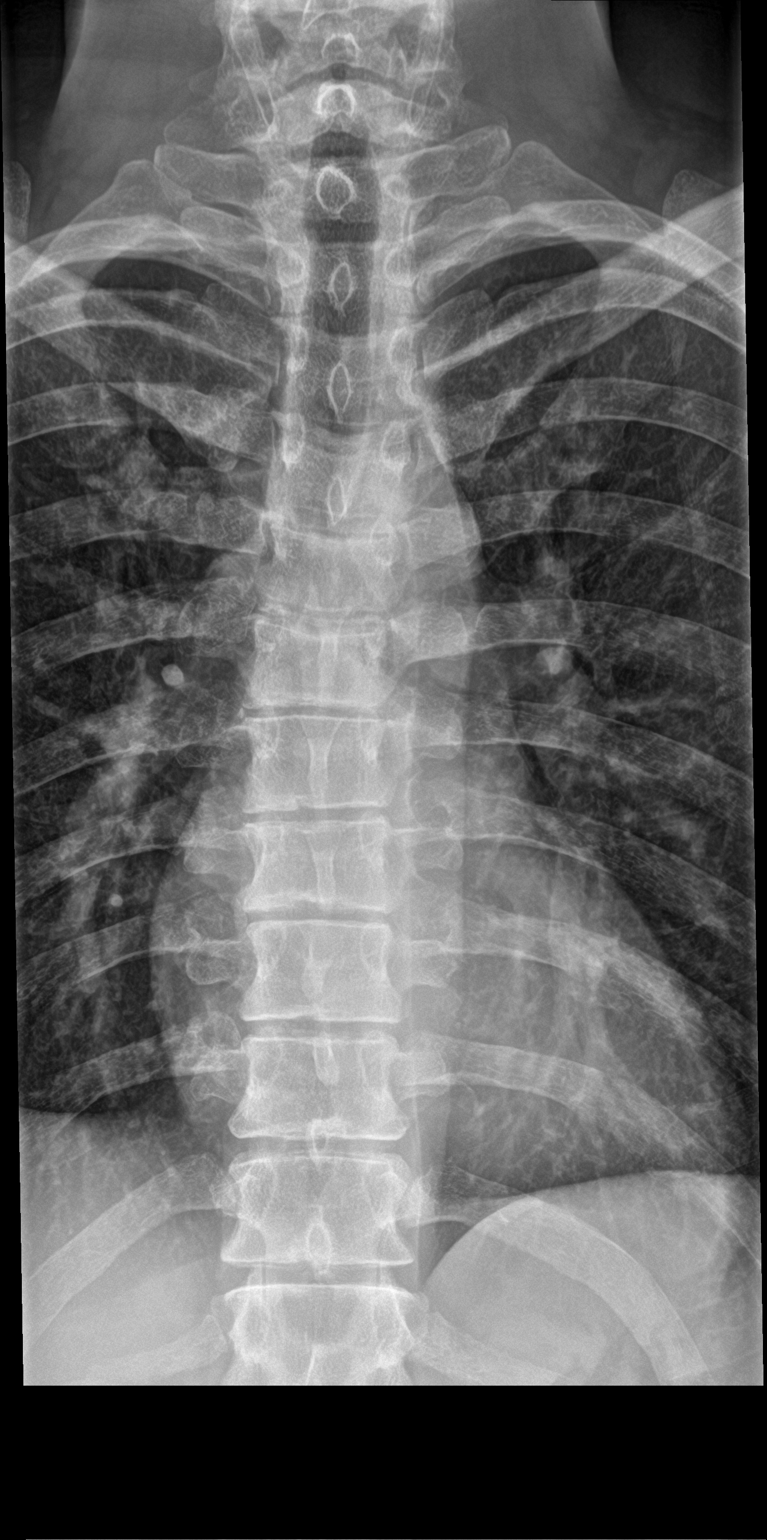

[t-spine lat]
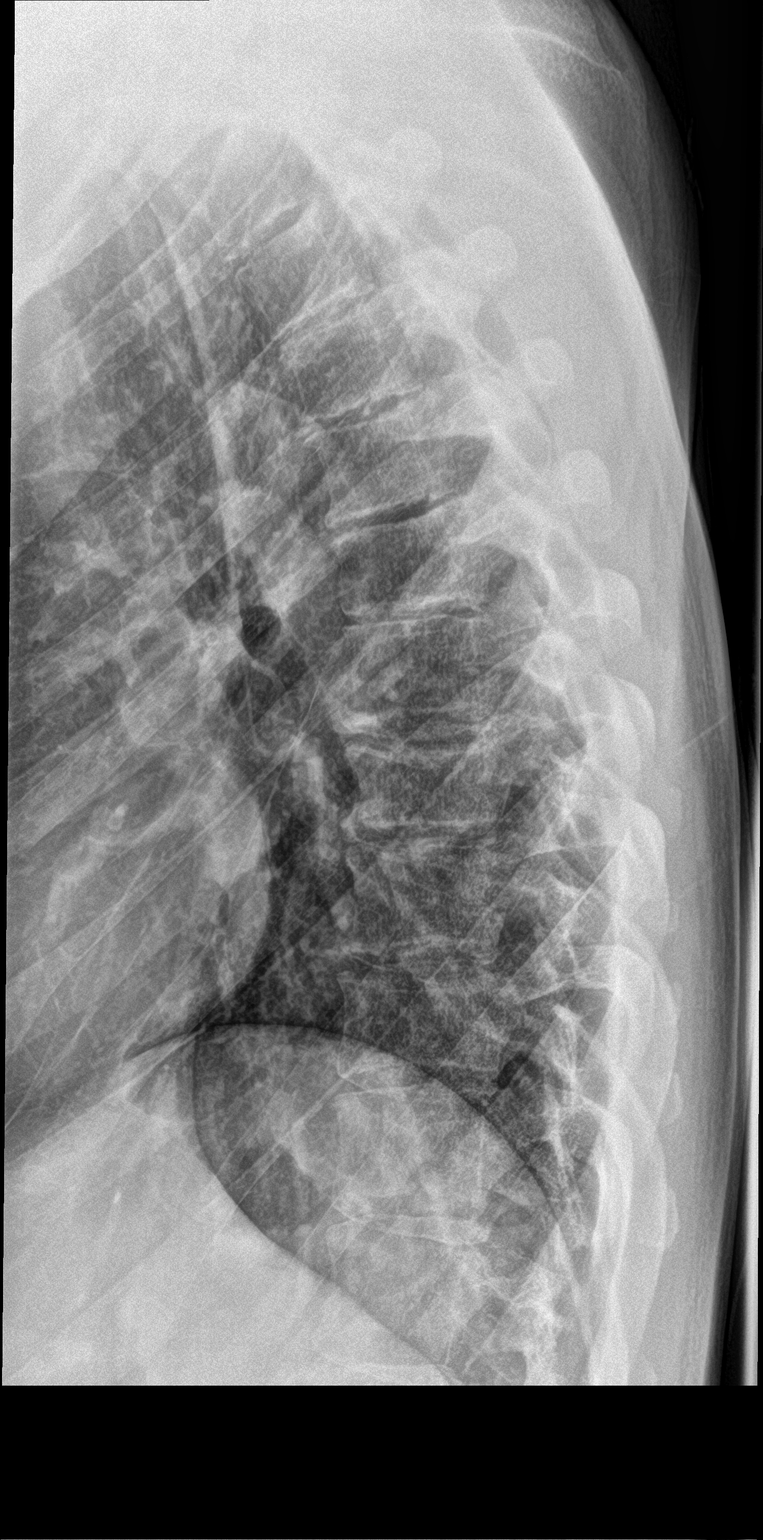

[t-spine swimmers]
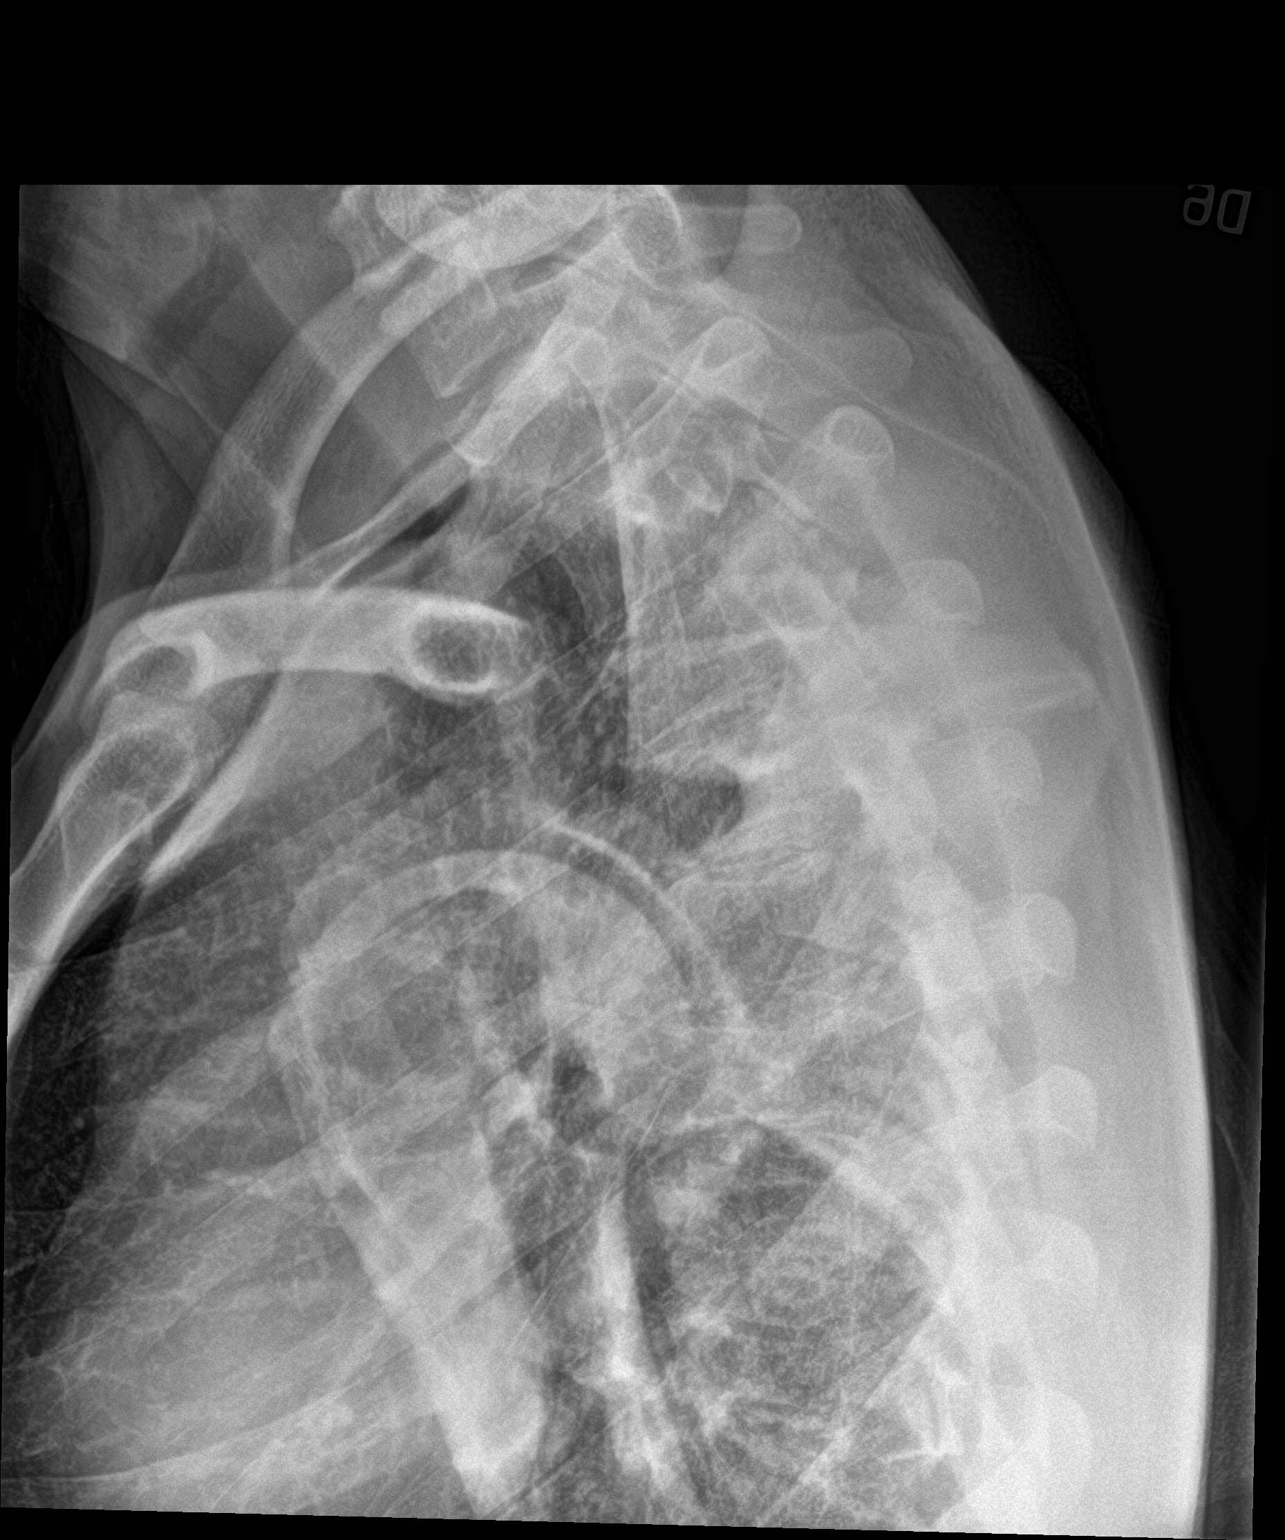

[3 of 3 positions shown; findings below may reference images not displayed]

FINDINGS: There is no evidence of thoracic spine fracture. Alignment is
normal. No other significant bone abnormalities are identified.
IMPRESSION: Negative.

## 2022-01-05 DIAGNOSIS — M9908 Segmental and somatic dysfunction of rib cage: Secondary | ICD-10-CM | POA: Diagnosis not present

## 2022-01-05 DIAGNOSIS — S29012A Strain of muscle and tendon of back wall of thorax, initial encounter: Secondary | ICD-10-CM | POA: Diagnosis not present

## 2022-01-05 DIAGNOSIS — M6249 Contracture of muscle, multiple sites: Secondary | ICD-10-CM | POA: Diagnosis not present

## 2022-01-05 DIAGNOSIS — M62830 Muscle spasm of back: Secondary | ICD-10-CM | POA: Diagnosis not present

## 2022-01-05 DIAGNOSIS — M9903 Segmental and somatic dysfunction of lumbar region: Secondary | ICD-10-CM | POA: Diagnosis not present

## 2022-01-05 DIAGNOSIS — M9902 Segmental and somatic dysfunction of thoracic region: Secondary | ICD-10-CM | POA: Diagnosis not present

## 2022-01-07 DIAGNOSIS — M9903 Segmental and somatic dysfunction of lumbar region: Secondary | ICD-10-CM | POA: Diagnosis not present

## 2022-01-07 DIAGNOSIS — M6249 Contracture of muscle, multiple sites: Secondary | ICD-10-CM | POA: Diagnosis not present

## 2022-01-07 DIAGNOSIS — M62830 Muscle spasm of back: Secondary | ICD-10-CM | POA: Diagnosis not present

## 2022-01-07 DIAGNOSIS — M9902 Segmental and somatic dysfunction of thoracic region: Secondary | ICD-10-CM | POA: Diagnosis not present

## 2022-01-07 DIAGNOSIS — M9908 Segmental and somatic dysfunction of rib cage: Secondary | ICD-10-CM | POA: Diagnosis not present

## 2022-01-07 DIAGNOSIS — S29012A Strain of muscle and tendon of back wall of thorax, initial encounter: Secondary | ICD-10-CM | POA: Diagnosis not present

## 2022-01-11 DIAGNOSIS — M9908 Segmental and somatic dysfunction of rib cage: Secondary | ICD-10-CM | POA: Diagnosis not present

## 2022-01-11 DIAGNOSIS — M62830 Muscle spasm of back: Secondary | ICD-10-CM | POA: Diagnosis not present

## 2022-01-11 DIAGNOSIS — M9903 Segmental and somatic dysfunction of lumbar region: Secondary | ICD-10-CM | POA: Diagnosis not present

## 2022-01-11 DIAGNOSIS — S29012A Strain of muscle and tendon of back wall of thorax, initial encounter: Secondary | ICD-10-CM | POA: Diagnosis not present

## 2022-01-11 DIAGNOSIS — M6249 Contracture of muscle, multiple sites: Secondary | ICD-10-CM | POA: Diagnosis not present

## 2022-01-11 DIAGNOSIS — M9902 Segmental and somatic dysfunction of thoracic region: Secondary | ICD-10-CM | POA: Diagnosis not present

## 2022-01-12 DIAGNOSIS — M62830 Muscle spasm of back: Secondary | ICD-10-CM | POA: Diagnosis not present

## 2022-01-12 DIAGNOSIS — M9908 Segmental and somatic dysfunction of rib cage: Secondary | ICD-10-CM | POA: Diagnosis not present

## 2022-01-12 DIAGNOSIS — M6249 Contracture of muscle, multiple sites: Secondary | ICD-10-CM | POA: Diagnosis not present

## 2022-01-12 DIAGNOSIS — S29012A Strain of muscle and tendon of back wall of thorax, initial encounter: Secondary | ICD-10-CM | POA: Diagnosis not present

## 2022-01-12 DIAGNOSIS — M9902 Segmental and somatic dysfunction of thoracic region: Secondary | ICD-10-CM | POA: Diagnosis not present

## 2022-01-12 DIAGNOSIS — M9903 Segmental and somatic dysfunction of lumbar region: Secondary | ICD-10-CM | POA: Diagnosis not present

## 2022-01-25 DIAGNOSIS — M62830 Muscle spasm of back: Secondary | ICD-10-CM | POA: Diagnosis not present

## 2022-01-25 DIAGNOSIS — M9902 Segmental and somatic dysfunction of thoracic region: Secondary | ICD-10-CM | POA: Diagnosis not present

## 2022-01-25 DIAGNOSIS — S29012A Strain of muscle and tendon of back wall of thorax, initial encounter: Secondary | ICD-10-CM | POA: Diagnosis not present

## 2022-01-25 DIAGNOSIS — M9903 Segmental and somatic dysfunction of lumbar region: Secondary | ICD-10-CM | POA: Diagnosis not present

## 2022-01-25 DIAGNOSIS — M9908 Segmental and somatic dysfunction of rib cage: Secondary | ICD-10-CM | POA: Diagnosis not present

## 2022-01-25 DIAGNOSIS — M6249 Contracture of muscle, multiple sites: Secondary | ICD-10-CM | POA: Diagnosis not present

## 2022-02-03 DIAGNOSIS — M6249 Contracture of muscle, multiple sites: Secondary | ICD-10-CM | POA: Diagnosis not present

## 2022-02-03 DIAGNOSIS — M9908 Segmental and somatic dysfunction of rib cage: Secondary | ICD-10-CM | POA: Diagnosis not present

## 2022-02-03 DIAGNOSIS — M62830 Muscle spasm of back: Secondary | ICD-10-CM | POA: Diagnosis not present

## 2022-02-03 DIAGNOSIS — M9903 Segmental and somatic dysfunction of lumbar region: Secondary | ICD-10-CM | POA: Diagnosis not present

## 2022-02-03 DIAGNOSIS — S29012A Strain of muscle and tendon of back wall of thorax, initial encounter: Secondary | ICD-10-CM | POA: Diagnosis not present

## 2022-02-03 DIAGNOSIS — M9902 Segmental and somatic dysfunction of thoracic region: Secondary | ICD-10-CM | POA: Diagnosis not present

## 2022-02-10 DIAGNOSIS — S29012A Strain of muscle and tendon of back wall of thorax, initial encounter: Secondary | ICD-10-CM | POA: Diagnosis not present

## 2022-02-10 DIAGNOSIS — M9903 Segmental and somatic dysfunction of lumbar region: Secondary | ICD-10-CM | POA: Diagnosis not present

## 2022-02-10 DIAGNOSIS — M9902 Segmental and somatic dysfunction of thoracic region: Secondary | ICD-10-CM | POA: Diagnosis not present

## 2022-02-10 DIAGNOSIS — M6249 Contracture of muscle, multiple sites: Secondary | ICD-10-CM | POA: Diagnosis not present

## 2022-02-10 DIAGNOSIS — M62830 Muscle spasm of back: Secondary | ICD-10-CM | POA: Diagnosis not present

## 2022-02-10 DIAGNOSIS — M9908 Segmental and somatic dysfunction of rib cage: Secondary | ICD-10-CM | POA: Diagnosis not present

## 2022-02-21 DIAGNOSIS — M6249 Contracture of muscle, multiple sites: Secondary | ICD-10-CM | POA: Diagnosis not present

## 2022-02-21 DIAGNOSIS — M9908 Segmental and somatic dysfunction of rib cage: Secondary | ICD-10-CM | POA: Diagnosis not present

## 2022-02-21 DIAGNOSIS — S29012A Strain of muscle and tendon of back wall of thorax, initial encounter: Secondary | ICD-10-CM | POA: Diagnosis not present

## 2022-02-21 DIAGNOSIS — M9902 Segmental and somatic dysfunction of thoracic region: Secondary | ICD-10-CM | POA: Diagnosis not present

## 2022-02-21 DIAGNOSIS — M9903 Segmental and somatic dysfunction of lumbar region: Secondary | ICD-10-CM | POA: Diagnosis not present

## 2022-02-21 DIAGNOSIS — M62830 Muscle spasm of back: Secondary | ICD-10-CM | POA: Diagnosis not present

## 2022-03-11 DIAGNOSIS — M546 Pain in thoracic spine: Secondary | ICD-10-CM | POA: Diagnosis not present

## 2022-03-11 DIAGNOSIS — M542 Cervicalgia: Secondary | ICD-10-CM | POA: Diagnosis not present

## 2022-03-16 ENCOUNTER — Encounter: Payer: Self-pay | Admitting: Physician Assistant

## 2022-03-16 ENCOUNTER — Ambulatory Visit: Payer: Self-pay | Admitting: Physician Assistant

## 2022-03-16 ENCOUNTER — Ambulatory Visit
Admission: RE | Admit: 2022-03-16 | Discharge: 2022-03-16 | Disposition: A | Discharge status: Home / Self Care | Payer: 59 | Attending: Physician Assistant | Admitting: Physician Assistant

## 2022-03-16 ENCOUNTER — Ambulatory Visit
Admission: RE | Admit: 2022-03-16 | Discharge: 2022-03-16 | Disposition: A | Discharge status: Home / Self Care | Payer: 59 | Source: Ambulatory Visit | Attending: Physician Assistant | Admitting: Physician Assistant

## 2022-03-16 VITALS — BP 118/74 | HR 70 | Temp 98.6°F | Resp 14 | Wt 147.0 lb

## 2022-03-16 DIAGNOSIS — R0789 Other chest pain: Secondary | ICD-10-CM | POA: Diagnosis not present

## 2022-03-16 DIAGNOSIS — R079 Chest pain, unspecified: Secondary | ICD-10-CM | POA: Diagnosis not present

## 2022-03-16 DIAGNOSIS — Z0000 Encounter for general adult medical examination without abnormal findings: Secondary | ICD-10-CM

## 2022-03-16 DIAGNOSIS — F411 Generalized anxiety disorder: Secondary | ICD-10-CM

## 2022-03-16 LAB — POCT URINALYSIS DIPSTICK
Bilirubin, UA: NEGATIVE
Blood, UA: NEGATIVE
Glucose, UA: NEGATIVE
Ketones, UA: NEGATIVE
Leukocytes, UA: NEGATIVE
Nitrite, UA: NEGATIVE
Protein, UA: NEGATIVE
Spec Grav, UA: 1.025 (ref 1.010–1.025)
Urobilinogen, UA: 0.2 E.U./dL
pH, UA: 6 (ref 5.0–8.0)

## 2022-03-16 MED ORDER — PAROXETINE HCL 20 MG PO TABS: 20.0000 mg | ORAL_TABLET | Freq: Every day | ORAL | 0 refills | Status: DC

## 2022-03-16 NOTE — Progress Notes (Signed)
   Subjective: Upper chest pain    Patient ID: OSHA KEMPNER, male    DOB: 07/30/1985, 37 y.o.   MRN: [id number]  HPI Patient states he experienced another episode of chest pain after he finished his shift at 6 AM this morning.  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  Patient described the pain as flutters in the chest with shortness of breath.  Denies any tingling in the hands or numbness.  Patient has a history of anxiety.  Takes no medication complaint.  Patient has had previous cardiac work with no objective findings.   Review of Systems Negative except for chief complaint    Objective:   Physical Exam No acute distress.  Does not appear anxious at this time. BP is 118/74, pulse 70, respiration 14, temperature 98.6, patient 90% O2 sat on room air. HEENT is unremarkable. Neck is supple followed lymphadenectomy or bruits. Lungs are clear to auscultation. Heart is regular rate and rhythm. EKG shows normal sinus rhythm.       Assessment & Plan: Anxiety versus chest pain  Discussed with patient no acute findings on EKG.  Patient will get a chest x-ray rule out any pulmonary involvement.  Patient advised to start logging these events.  Advised patient a prescription for Paxil will be generated.  Patient is not to take medication until I see the results of the chest x-ray.  Patient will follow-up in 2 weeks.

## 2022-03-16 NOTE — Progress Notes (Signed)
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  Stated hx of anxiety and heart echo with no problems known of heart related.  Concerned about this episode and requested to see provider.  Pre physical work up done as annual approaching.

## 2022-03-17 LAB — CMP12+LP+TP+TSH+6AC+PSA+CBC…
ALT: 14 IU/L (ref 0–44)
AST: 19 IU/L (ref 0–40)
Albumin/Globulin Ratio: 1.8 (ref 1.2–2.2)
Albumin: 4.7 g/dL (ref 4.1–5.1)
Alkaline Phosphatase: 67 IU/L (ref 44–121)
BUN/Creatinine Ratio: 12 (ref 9–20)
BUN: 12 mg/dL (ref 6–20)
Basophils Absolute: 0 10*3/uL (ref 0.0–0.2)
Basos: 1 %
Bilirubin Total: 0.7 mg/dL (ref 0.0–1.2)
Calcium: 9.4 mg/dL (ref 8.7–10.2)
Chloride: 104 mmol/L (ref 96–106)
Chol/HDL Ratio: 2.8 ratio (ref 0.0–5.0)
Cholesterol, Total: 129 mg/dL (ref 100–199)
Creatinine, Ser: 1.03 mg/dL (ref 0.76–1.27)
EOS (ABSOLUTE): 0.1 10*3/uL (ref 0.0–0.4)
Eos: 1 %
Estimated CHD Risk: <0.5 times avg. (ref 0.0–1.0)
Free Thyroxine Index: 1.9 (ref 1.2–4.9)
GGT: 9 IU/L (ref 0–65)
Globulin, Total: 2.6 g/dL (ref 1.5–4.5)
Glucose: 82 mg/dL (ref 70–99)
HDL: 46 mg/dL (ref >39)
Hematocrit: 39.6 % (ref 37.5–51.0)
Hemoglobin: 14 g/dL (ref 13.0–17.7)
Immature Grans (Abs): 0 10*3/uL (ref 0.0–0.1)
Immature Granulocytes: 0 %
Iron: 71 ug/dL (ref 38–169)
LDH: 137 IU/L (ref 121–224)
LDL Chol Calc (NIH): 70 mg/dL (ref 0–99)
Lymphocytes Absolute: 2.7 10*3/uL (ref 0.7–3.1)
Lymphs: 34 %
MCH: 32.7 pg (ref 26.6–33.0)
MCHC: 35.4 g/dL (ref 31.5–35.7)
MCV: 93 fL (ref 79–97)
Monocytes Absolute: 0.5 10*3/uL (ref 0.1–0.9)
Monocytes: 6 %
Neutrophils Absolute: 4.7 10*3/uL (ref 1.4–7.0)
Neutrophils: 58 %
Phosphorus: 3.5 mg/dL (ref 2.8–4.1)
Platelets: 202 10*3/uL (ref 150–450)
Potassium: 3.9 mmol/L (ref 3.5–5.2)
Prostate Specific Ag, Serum: 0.6 ng/mL (ref 0.0–4.0)
RBC: 4.28 x10E6/uL (ref 4.14–5.80)
RDW: 12.6 % (ref 11.6–15.4)
Sodium: 140 mmol/L (ref 134–144)
T3 Uptake Ratio: 27 % (ref 24–39)
T4, Total: 6.9 ug/dL (ref 4.5–12.0)
TSH: 4.73 u[IU]/mL — ABNORMAL HIGH (ref 0.450–4.500)
Total Protein: 7.3 g/dL (ref 6.0–8.5)
Triglycerides: 64 mg/dL (ref 0–149)
Uric Acid: 5.1 mg/dL (ref 3.8–8.4)
VLDL Cholesterol Cal: 13 mg/dL (ref 5–40)
WBC: 8 10*3/uL (ref 3.4–10.8)
eGFR: 98 mL/min/{1.73_m2} (ref >59)

## 2022-03-22 DIAGNOSIS — M542 Cervicalgia: Secondary | ICD-10-CM | POA: Diagnosis not present

## 2022-03-22 DIAGNOSIS — M546 Pain in thoracic spine: Secondary | ICD-10-CM | POA: Diagnosis not present

## 2022-04-07 ENCOUNTER — Other Ambulatory Visit: Payer: Self-pay | Admitting: Physician Assistant

## 2022-04-07 ENCOUNTER — Encounter: Payer: 59 | Admitting: Physician Assistant

## 2022-04-07 ENCOUNTER — Encounter: Payer: Self-pay | Admitting: Physician Assistant

## 2022-04-07 ENCOUNTER — Ambulatory Visit: Payer: Self-pay | Admitting: Physician Assistant

## 2022-04-07 VITALS — BP 107/74 | HR 74 | Temp 98.2°F | Resp 14 | Ht 69.0 in | Wt 150.0 lb

## 2022-04-07 DIAGNOSIS — Z0000 Encounter for general adult medical examination without abnormal findings: Secondary | ICD-10-CM

## 2022-04-07 MED ORDER — PAROXETINE HCL 20 MG PO TABS: 20.0000 mg | ORAL_TABLET | Freq: Every day | ORAL | 3 refills | Status: DC

## 2022-04-07 MED ORDER — ORPHENADRINE CITRATE ER 100 MG PO TB12: 100.0000 mg | ORAL_TABLET | Freq: Two times a day (BID) | ORAL | 3 refills | Status: DC

## 2022-04-07 NOTE — Progress Notes (Signed)
Pt presents today to complete physical, Pt stating mid back pain x3 years. He states he's had imaging done with negative results but pt is stating he continues to have pain. Burna Sis

## 2022-04-07 NOTE — Progress Notes (Signed)
[facility]  ____________________________________________   None    (approximate)  I have reviewed the triage vital signs and the nursing notes.   HISTORY  Chief Complaint No chief complaint on file.    HPI Chase Stewart is a 37 y.o. male patient presents for annual physical exam.  Patient complaining of chronic upper back pain for approximately 3 years.  Patient states multiple visits to specialists to include MRI x-rays which were unremarkable.         Past Medical History:  Diagnosis Date   Anxiety    Chronic kidney disease    h/o kidney stones   Depression    Migraines     Patient Active Problem List   Diagnosis Date Noted   Acute back pain 04/02/2020   Rib pain 04/02/2020   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   History of herpes labialis 12/28/2015   Shoulder pain 08/28/2015    Past Surgical History:  Procedure Laterality Date   ROTATOR CUFF REPAIR Right    SHOULDER ARTHROSCOPY WITH OPEN ROTATOR CUFF REPAIR AND DISTAL CLAVICLE ACROMINECTOMY Left 05/26/2015   Procedure: SHOULDER ARTHROSCOPY WITH ANTERIOR AND SUPERIOR LABRAL REPAIR;  Surgeon: Thornton Park, MD;  Location: [facility] ORS;  Service: Orthopedics;  Laterality: Left;   TONSILLECTOMY     TYMPANOSTOMY TUBE PLACEMENT      Prior to Admission medications   Medication Sig Start Date End Date Taking? Authorizing Provider  ALPHA LIPOIC ACID PO Take by mouth daily.    [provider]  lidocaine (LIDODERM) 5 % Place 1 patch onto the skin daily. Remove & Discard patch within 12 hours or as directed by MD Patient not taking: Reported on 03/16/2022 11/12/20   Raulkar, Clide Deutscher, MD  Omega-3 Fatty Acids (FISH OIL PO) Take 1 capsule by mouth daily. Patient not taking: Reported on 03/16/2022    [provider]  PARoxetine (PAXIL) 20 MG tablet Take 1 tablet (20 mg total) by mouth daily. 03/16/22   Sable Feil, PA-C  TURMERIC PO Take by mouth daily.    [provider]  valACYclovir (VALTREX) 1000 MG tablet Take 1 tablet (1,000 mg total) by mouth 2 (two) times daily. Patient not taking: Reported on 03/16/2022 11/06/21   Sable Feil, PA-C    Allergies Patient has no known allergies.  Family History  Problem Relation Age of Onset   Hyperthyroidism Father    CAD Father     Social History Social History   Tobacco Use   Smoking status: Never   Smokeless tobacco: Never  Vaping Use   Vaping Use: Never used  Substance Use Topics   Alcohol use: No   Drug use: Not Currently    Types: Marijuana    Review of Systems Constitutional: No fever/chills Eyes: No visual changes. ENT: No sore throat. Cardiovascular: Denies chest pain. Respiratory: Denies shortness of breath. Gastrointestinal: No abdominal pain.  No nausea, no vomiting.  No diarrhea.  No constipation. Genitourinary: Negative for dysuria. Musculoskeletal: Negative for back pain. Skin: Negative for rash. Neurological: Negative for headaches, focal weakness or numbness. Psychiatric: Anxiety and depression  ____________________________________________   PHYSICAL EXAM:  VITAL SIGNS: Constitutional: Alert and oriented. Well appearing and in no acute distress. Eyes: Conjunctivae are normal. PERRL. EOMI. Head: Atraumatic. Nose: No congestion/rhinnorhea. Mouth/Throat: Mucous membranes are moist.  Oropharynx non-erythematous. Neck: No stridor.  No cervical spine tenderness to palpation. Hematological/Lymphatic/Immunilogical: No cervical lymphadenopathy. Cardiovascular: Normal rate, regular rhythm. Grossly normal heart sounds.  Good peripheral  circulation. Respiratory: Normal respiratory effort.  No retractions. Lungs CTAB. Gastrointestinal: Soft and nontender. No distention. No abdominal bruits. No CVA tenderness. Genitourinary: Deferred Musculoskeletal: No lower extremity tenderness nor edema.  No joint effusions. Neurologic:  Normal speech and language. No gross focal neurologic  deficits are appreciated. No gait instability. Skin:  Skin is warm, dry and intact. No rash noted. Psychiatric: Mood and affect are normal. Speech and behavior are normal.  ____________________________________________   LABS _____      Component Ref Range & Units 3 wk ago 2 yr ago 12 yr ago  Color, UA yellow Yellow   Clarity, UA clear Clear   Glucose, UA Negative Negative Negative   Bilirubin, UA neg Negative   Ketones, UA neg Negative   Spec Grav, UA 1.010 - 1.025 1.025 >=1.030 Abnormal    Blood, UA neg Negative   pH, UA 5.0 - 8.0 6.0 6.0   Protein, UA Negative Negative Negative   Urobilinogen, UA 0.2 or 1.0 E.U./dL 0.2 0.2 0.2 R  Nitrite, UA neg Negative   Leukocytes, UA Negative Negative Negative NEGATIVE R  Appearance dark  CLEAR R  Odor none    Resulting Agency   [facility]                 Component Ref Range & Units 3 wk ago (03/16/22) 1 yr ago (07/29/20) 1 yr ago (05/15/20) 2 yr ago (04/01/20) 2 yr ago (04/01/20) 2 yr ago (03/11/20) 2 yr ago (03/11/20) 2 yr ago (03/11/20)  Glucose 70 - 99 mg/dL 82 77 R, CM   82 R, CM  110 High  R   Uric Acid 3.8 - 8.4 mg/dL 5.1         Comment:            Therapeutic target for gout patients: <6.0  BUN 6 - 20 mg/dL 12 16 R   12 R  13   Creatinine, Ser 0.76 - 1.27 mg/dL 1.03 0.98 R   0.97 R  1.02   eGFR >59 mL/min/1.73 98 104 R, CM     100 CM   BUN/Creatinine Ratio 9 - 20 12 NOT APPLICABLE R   NOT APPLICABLE R  13   Sodium 134 - 144 mmol/L 140 140 R   138 R  143   Potassium 3.5 - 5.2 mmol/L 3.9 4.3 R   4.2 R  4.5   Chloride 96 - 106 mmol/L 104 105 R   102 R  103   Calcium 8.7 - 10.2 mg/dL 9.4 9.6 R   9.6 R  9.2   Phosphorus 2.8 - 4.1 mg/dL 3.5         Total Protein 6.0 - 8.5 g/dL 7.3 7.5 R   7.9 R  7.6   Albumin 4.1 - 5.1 g/dL 4.7      4.7 R   Globulin, Total 1.5 - 4.5 g/dL 2.6      2.9   Albumin/Globulin Ratio 1.2 - 2.2 1.8      1.6   Bilirubin Total 0.0 - 1.2 mg/dL 0.7 1.2 R   0.8 R  0.8   Alkaline  Phosphatase 44 - 121 IU/L 67      84   LDH 121 - 224 IU/L 137     235 High     AST 0 - 40 IU/L 19 27 R   48 High  R  44 High    ALT 0 - 44 IU/L 14 30 R  93 High  R  51 High    GGT 0 - 65 IU/L 9         Iron 38 - 169 ug/dL 71         Cholesterol, Total 100 - 199 mg/dL 129         Triglycerides 0 - 149 mg/dL 64         HDL >39 mg/dL 46         VLDL Cholesterol Cal 5 - 40 mg/dL 13         LDL Chol Calc (NIH) 0 - 99 mg/dL 70         Chol/HDL Ratio 0.0 - 5.0 ratio 2.8         Comment:                                   T. Chol/HDL Ratio                                             Men  Women                               1/2 Avg.Risk  3.4    3.3                                   Avg.Risk  5.0    4.4                                2X Avg.Risk  9.6    7.1                                3X Avg.Risk 23.4   11.0  Estimated CHD Risk 0.0 - 1.0 times avg.  < 0.5         Comment: The CHD Risk is based on the T. Chol/HDL ratio. Other factors affect CHD Risk such as hypertension, smoking, diabetes, severe obesity, and family history of premature CHD.  TSH 0.450 - 4.500 uIU/mL 4.730 High   2.210 2.71 R      T4, Total 4.5 - 12.0 ug/dL 6.9   9.3 R      T3 Uptake Ratio 24 - 39 % 27         Free Thyroxine Index 1.2 - 4.9 1.9   2.3 R      Prostate Specific Ag, Serum 0.0 - 4.0 ng/mL 0.6         Comment: Roche ECLIA methodology. According to the American Urological Association, Serum PSA should decrease and remain at undetectable levels after radical prostatectomy. The AUA defines biochemical recurrence as an initial PSA value 0.2 ng/mL or greater followed by a subsequent confirmatory PSA value 0.2 ng/mL or greater. Values obtained with different assay methods or kits cannot be used interchangeably. Results cannot be interpreted as absolute evidence of the presence or absence of malignant disease.  WBC 3.4 - 10.8 x10E3/uL 8.0       6.3  RBC 4.14 - 5.80 x10E6/uL 4.28  4.61   Hemoglobin 13.0 - 17.7 g/dL 14.0       14.7  Hematocrit 37.5 - 51.0 % 39.6       42.4  MCV 79 - 97 fL 93       92  MCH 26.6 - 33.0 pg 32.7       31.9  MCHC 31.5 - 35.7 g/dL 35.4       34.7  RDW 11.6 - 15.4 % 12.6       12.1  Platelets 150 - 450 x10E3/uL 202         Neutrophils Not Estab. % 58       42  Lymphs Not Estab. % 34       49  Monocytes Not Estab. % 6       8  Eos Not Estab. % 1       0  Basos Not Estab. % 1       1  Neutrophils Absolute 1.4 - 7.0 x10E3/uL 4.7       2.7  Lymphocytes Absolute 0.7 - 3.1 x10E3/uL 2.7       3.1  Monocytes Absolute 0.1 - 0.9 x10E3/uL 0.5       0.5  EOS (ABSOLUTE) 0.0 - 0.4 x10E3/uL 0.1       0.0  Basophils Absolute 0.0 - 0.2 x10E3/uL 0.0       0.1  Immature Granulocytes Not Estab. % 0       0  Immature Grans               _______________________________________   ____________________________________________   INITIAL IMPRESSION / ASSESSMENT AND PLAN  As part of my medical decision making, I reviewed the following data within the electronic MEDICAL RECORD NUMBER       No acute on physical exam.  Patient labs show increase in TSH above normal limit.  Patient has a family history of hypothyroidism.  Recommend repeating TSH in 3 months.  Patient also given a prescription for Norflex to take as needed for upper back pain.     ____________________________________________   FINAL CLINICAL IMPRESSION  Well exam   ED Discharge Orders     None        Note:  This document was prepared using Dragon voice recognition software and may include unintentional dictation errors.

## 2022-06-15 DIAGNOSIS — M9902 Segmental and somatic dysfunction of thoracic region: Secondary | ICD-10-CM | POA: Diagnosis not present

## 2022-06-15 DIAGNOSIS — M9901 Segmental and somatic dysfunction of cervical region: Secondary | ICD-10-CM | POA: Diagnosis not present

## 2022-06-15 DIAGNOSIS — M62830 Muscle spasm of back: Secondary | ICD-10-CM | POA: Diagnosis not present

## 2022-06-15 DIAGNOSIS — M5414 Radiculopathy, thoracic region: Secondary | ICD-10-CM | POA: Diagnosis not present

## 2022-06-20 DIAGNOSIS — M9901 Segmental and somatic dysfunction of cervical region: Secondary | ICD-10-CM | POA: Diagnosis not present

## 2022-06-20 DIAGNOSIS — M9902 Segmental and somatic dysfunction of thoracic region: Secondary | ICD-10-CM | POA: Diagnosis not present

## 2022-06-20 DIAGNOSIS — M5414 Radiculopathy, thoracic region: Secondary | ICD-10-CM | POA: Diagnosis not present

## 2022-06-20 DIAGNOSIS — M62830 Muscle spasm of back: Secondary | ICD-10-CM | POA: Diagnosis not present

## 2022-07-13 ENCOUNTER — Encounter: Payer: Self-pay | Admitting: Physician Assistant

## 2022-07-13 ENCOUNTER — Ambulatory Visit: Payer: 59 | Admitting: Physician Assistant

## 2022-07-13 ENCOUNTER — Other Ambulatory Visit: Payer: Self-pay

## 2022-07-13 VITALS — BP 102/61 | HR 67 | Temp 97.8°F | Resp 12

## 2022-07-13 DIAGNOSIS — M546 Pain in thoracic spine: Secondary | ICD-10-CM

## 2022-07-13 DIAGNOSIS — R7989 Other specified abnormal findings of blood chemistry: Secondary | ICD-10-CM

## 2022-07-13 MED ORDER — METHYLPREDNISOLONE 4 MG PO TBPK: ORAL_TABLET | ORAL | 0 refills | Status: DC

## 2022-07-13 MED ORDER — ORPHENADRINE CITRATE ER 100 MG PO TB12: 100.0000 mg | ORAL_TABLET | Freq: Two times a day (BID) | ORAL | 3 refills | Status: DC

## 2022-07-13 NOTE — Progress Notes (Signed)
Completed physical 04/07/2022 with Durward Parcel, PA-C.  TSH was elevated & was advised to return to clinic in 3 months for follow-up Thyroid Panel with TSH.  Specimen obtained for Thyroid Panel with TSH.  [facility] Courier will pick-up & take to [facility] for processing.  AMD

## 2022-07-13 NOTE — Progress Notes (Signed)
   Subjective:Chronic pain left upper back    Patient ID: Chase Stewart, male    DOB: Dec 14, 1985, 37 y.o.   MRN: [id number]  HPI Chronic left thoracic for greater than 3 years.  Patient has multimodal medical therapy consisting of NSAIDs, steroids, muscle relaxers, trigger point injections, and physical therapy will follow-up definitive pain relief.  Patient had imaging consisting of x-rays and MRI.  MRI was taken on 10/22/2021 shows small projections at multiple levels of the thoracic spine without central or foraminal stenosis. Past medical history remarkable for anxiety.    Objective:   Physical Exam BP 102/61  BP Location Left Arm  Patient Position Sitting  Cuff Size Large  Pulse 67  Resp 12  Temp 97.8 F (36.6 C)  Temp src Temporal  SpO2 98 %  No obvious deformity to the thoracic spine.  Patient has moderate guarding palpation of the inferior aspect of the left scapular area.  Patient has full and equal range of motion of the upper extremities.  Neurovascular intact.       Assessment & Plan: Chronic thoracic pain  Patient given prescription for muscle relaxers and steroids.  Patient will be consulted to neurology for definitive evaluation.

## 2022-07-14 LAB — THYROID PANEL WITH TSH
Free Thyroxine Index: 2 (ref 1.2–4.9)
T3 Uptake Ratio: 27 % (ref 24–39)
T4, Total: 7.3 ug/dL (ref 4.5–12.0)
TSH: 4.06 u[IU]/mL (ref 0.450–4.500)

## 2022-07-15 NOTE — Addendum Note (Signed)
Addended by: Gardner Candle on: 07/15/2022 09:44 AM   Modules accepted: Orders

## 2022-07-28 DIAGNOSIS — M5414 Radiculopathy, thoracic region: Secondary | ICD-10-CM | POA: Diagnosis not present

## 2022-07-28 DIAGNOSIS — M9902 Segmental and somatic dysfunction of thoracic region: Secondary | ICD-10-CM | POA: Diagnosis not present

## 2022-07-28 DIAGNOSIS — M62830 Muscle spasm of back: Secondary | ICD-10-CM | POA: Diagnosis not present

## 2022-07-28 DIAGNOSIS — M9901 Segmental and somatic dysfunction of cervical region: Secondary | ICD-10-CM | POA: Diagnosis not present

## 2022-08-03 ENCOUNTER — Other Ambulatory Visit: Payer: Self-pay

## 2022-08-03 DIAGNOSIS — Z7689 Persons encountering health services in other specified circumstances: Secondary | ICD-10-CM | POA: Diagnosis not present

## 2022-08-03 DIAGNOSIS — M549 Dorsalgia, unspecified: Secondary | ICD-10-CM | POA: Diagnosis not present

## 2022-08-03 DIAGNOSIS — M542 Cervicalgia: Secondary | ICD-10-CM | POA: Diagnosis not present

## 2022-08-03 DIAGNOSIS — Z8619 Personal history of other infectious and parasitic diseases: Secondary | ICD-10-CM

## 2022-08-03 DIAGNOSIS — R202 Paresthesia of skin: Secondary | ICD-10-CM | POA: Diagnosis not present

## 2022-08-03 DIAGNOSIS — R200 Anesthesia of skin: Secondary | ICD-10-CM | POA: Diagnosis not present

## 2022-08-03 MED ORDER — VALACYCLOVIR HCL 1 G PO TABS: 1000.0000 mg | ORAL_TABLET | Freq: Two times a day (BID) | ORAL | 2 refills | Status: AC

## 2022-09-06 DIAGNOSIS — R200 Anesthesia of skin: Secondary | ICD-10-CM | POA: Diagnosis not present

## 2022-09-06 DIAGNOSIS — R202 Paresthesia of skin: Secondary | ICD-10-CM | POA: Diagnosis not present

## 2022-09-06 DIAGNOSIS — G479 Sleep disorder, unspecified: Secondary | ICD-10-CM | POA: Diagnosis not present

## 2022-09-06 DIAGNOSIS — M542 Cervicalgia: Secondary | ICD-10-CM | POA: Diagnosis not present

## 2022-09-06 DIAGNOSIS — M549 Dorsalgia, unspecified: Secondary | ICD-10-CM | POA: Diagnosis not present

## 2022-09-13 DIAGNOSIS — G8929 Other chronic pain: Secondary | ICD-10-CM | POA: Diagnosis not present

## 2022-09-13 DIAGNOSIS — M546 Pain in thoracic spine: Secondary | ICD-10-CM | POA: Diagnosis not present

## 2022-09-13 DIAGNOSIS — M5414 Radiculopathy, thoracic region: Secondary | ICD-10-CM | POA: Diagnosis not present

## 2022-09-21 ENCOUNTER — Encounter: Payer: Self-pay | Admitting: Physician Assistant

## 2022-09-21 ENCOUNTER — Ambulatory Visit: Payer: Self-pay | Admitting: Physician Assistant

## 2022-09-21 VITALS — BP 118/78 | HR 64 | Temp 97.3°F | Resp 12 | Ht 69.0 in | Wt 153.0 lb

## 2022-09-21 DIAGNOSIS — F32A Depression, unspecified: Secondary | ICD-10-CM | POA: Insufficient documentation

## 2022-09-21 DIAGNOSIS — Z77120 Contact with and (suspected) exposure to mold (toxic): Secondary | ICD-10-CM

## 2022-09-21 NOTE — Progress Notes (Signed)
   Subjective: Mold exposure    Patient ID: Chase Stewart, male    DOB: 1985-07-15, 37 y.o.   MRN: [id number]  HPI Patient recently found out that this home has mold between the walls of his house.  Denies any complaint except for chronic back pain.   Review of Systems Anxiety and depression    Objective:   Physical Exam BP Location Left Arm  Patient Position Sitting  Cuff Size Large  Pulse 64  Resp 12  Temp 97.3 F (36.3 C)  Temp src Temporal  SpO2 100 %  Weight 153 lb (69.4 kg)  Height 5\' 9"  (1.753 m)   BMI 22.59 kg/m2  BSA 1.84 m2  Physical exam was deferred.       Assessment & Plan: Mold exposure   Advised to follow-up status post lab test.

## 2022-09-21 NOTE — Progress Notes (Signed)
Back pain started 3 years agao about 4 months after moving into a house.  States just found out house has a mold issue.  Questions today about whether his pains could be related to the mold issue in the house  States not using Lidoderm patches because he ran out of them.

## 2022-10-07 DIAGNOSIS — M5414 Radiculopathy, thoracic region: Secondary | ICD-10-CM | POA: Diagnosis not present

## 2022-10-26 DIAGNOSIS — G8929 Other chronic pain: Secondary | ICD-10-CM | POA: Diagnosis not present

## 2022-10-26 DIAGNOSIS — M546 Pain in thoracic spine: Secondary | ICD-10-CM | POA: Diagnosis not present

## 2022-10-26 DIAGNOSIS — M5414 Radiculopathy, thoracic region: Secondary | ICD-10-CM | POA: Diagnosis not present

## 2022-10-31 ENCOUNTER — Other Ambulatory Visit: Payer: Self-pay | Admitting: Physical Medicine & Rehabilitation

## 2022-10-31 DIAGNOSIS — M5414 Radiculopathy, thoracic region: Secondary | ICD-10-CM

## 2022-11-12 ENCOUNTER — Inpatient Hospital Stay
Admission: RE | Admit: 2022-11-12 | Discharge: 2022-11-12 | Discharge status: Home / Self Care | Payer: 59 | Source: Ambulatory Visit | Attending: Physical Medicine & Rehabilitation | Admitting: Physical Medicine & Rehabilitation

## 2022-11-12 DIAGNOSIS — M5124 Other intervertebral disc displacement, thoracic region: Secondary | ICD-10-CM | POA: Diagnosis not present

## 2022-11-12 DIAGNOSIS — M5414 Radiculopathy, thoracic region: Secondary | ICD-10-CM

## 2022-11-30 DIAGNOSIS — G8929 Other chronic pain: Secondary | ICD-10-CM | POA: Diagnosis not present

## 2022-11-30 DIAGNOSIS — M546 Pain in thoracic spine: Secondary | ICD-10-CM | POA: Diagnosis not present

## 2022-11-30 DIAGNOSIS — M5414 Radiculopathy, thoracic region: Secondary | ICD-10-CM | POA: Diagnosis not present

## 2022-12-05 DIAGNOSIS — M5414 Radiculopathy, thoracic region: Secondary | ICD-10-CM | POA: Diagnosis not present

## 2022-12-05 DIAGNOSIS — M9902 Segmental and somatic dysfunction of thoracic region: Secondary | ICD-10-CM | POA: Diagnosis not present

## 2022-12-05 DIAGNOSIS — M62830 Muscle spasm of back: Secondary | ICD-10-CM | POA: Diagnosis not present

## 2022-12-05 DIAGNOSIS — M9901 Segmental and somatic dysfunction of cervical region: Secondary | ICD-10-CM | POA: Diagnosis not present

## 2022-12-15 DIAGNOSIS — M5414 Radiculopathy, thoracic region: Secondary | ICD-10-CM | POA: Diagnosis not present

## 2023-01-09 ENCOUNTER — Ambulatory Visit: Payer: Self-pay | Admitting: Physician Assistant

## 2023-01-09 ENCOUNTER — Encounter: Payer: Self-pay | Admitting: Physician Assistant

## 2023-01-09 VITALS — BP 113/74 | HR 59 | Resp 14 | Ht 69.0 in | Wt 150.0 lb

## 2023-01-09 DIAGNOSIS — M5414 Radiculopathy, thoracic region: Secondary | ICD-10-CM

## 2023-01-09 NOTE — Progress Notes (Signed)
Tingling down neck to shoulder about a month ago around the time he got a shot in his back. Friday sensitive skin on fore arm,face and front side of calf all on left side.

## 2023-01-09 NOTE — Progress Notes (Signed)
   Subjective: Thoracic radiculitis    Patient ID: Chase Stewart, male    DOB: 20-Aug-1985, 37 y.o.   MRN: [id number]  HPI Patient presents with tingling radiating down his neck to the left shoulder.  States also feel left facial area sensitive to touch.  States he noticed sensitivity to the left forearm and left calf.  States onset of complaint for 3 days.  Denies vision disturbance, vertigo, or weakness.  Patient has a history of thoracic radiculitis.  Patient received epidural steroid injections by physical medicine.  Last injection was 12/15/2022.  Patient is also taking Neurontin 300 mg twice daily.  Patient has not voiced these complaints to treating doctor.   Review of Systems Anxiety, depression, and chronic shoulder pain.    Objective:   Physical Exam BP 113/74  Pulse Rate 59Pulse Rate. 59. Data is abnormal. Taken on 01/09/23 11:26 AM  Weight 150 lb (68 kg)  Height 5\' 9"  (1.753 m)  Resp 14  SpO2 98 %  Physical exam deferred       Assessment & Plan: Thoracic Radiculitis  Advised patient to contact physical medicine treating doctor for definitive evaluation and treatment.

## 2023-01-10 ENCOUNTER — Other Ambulatory Visit: Payer: Self-pay | Admitting: Physical Medicine & Rehabilitation

## 2023-01-10 DIAGNOSIS — M542 Cervicalgia: Secondary | ICD-10-CM

## 2023-01-10 DIAGNOSIS — M5412 Radiculopathy, cervical region: Secondary | ICD-10-CM | POA: Diagnosis not present

## 2023-01-10 DIAGNOSIS — M546 Pain in thoracic spine: Secondary | ICD-10-CM | POA: Diagnosis not present

## 2023-01-10 DIAGNOSIS — G8929 Other chronic pain: Secondary | ICD-10-CM | POA: Diagnosis not present

## 2023-01-10 DIAGNOSIS — M5414 Radiculopathy, thoracic region: Secondary | ICD-10-CM | POA: Diagnosis not present

## 2023-01-12 ENCOUNTER — Inpatient Hospital Stay
Admission: RE | Admit: 2023-01-12 | Discharge: 2023-01-12 | Discharge status: Home / Self Care | Payer: 59 | Source: Ambulatory Visit | Attending: Physical Medicine & Rehabilitation | Admitting: Physical Medicine & Rehabilitation

## 2023-01-12 DIAGNOSIS — M50222 Other cervical disc displacement at C5-C6 level: Secondary | ICD-10-CM | POA: Diagnosis not present

## 2023-01-12 DIAGNOSIS — M50221 Other cervical disc displacement at C4-C5 level: Secondary | ICD-10-CM | POA: Diagnosis not present

## 2023-01-12 DIAGNOSIS — M542 Cervicalgia: Secondary | ICD-10-CM

## 2023-01-12 DIAGNOSIS — M5402 Panniculitis affecting regions of neck and back, cervical region: Secondary | ICD-10-CM | POA: Diagnosis not present

## 2023-01-30 DIAGNOSIS — R202 Paresthesia of skin: Secondary | ICD-10-CM | POA: Diagnosis not present

## 2023-01-30 DIAGNOSIS — M542 Cervicalgia: Secondary | ICD-10-CM | POA: Diagnosis not present

## 2023-01-30 DIAGNOSIS — G479 Sleep disorder, unspecified: Secondary | ICD-10-CM | POA: Diagnosis not present

## 2023-01-30 DIAGNOSIS — M549 Dorsalgia, unspecified: Secondary | ICD-10-CM | POA: Diagnosis not present

## 2023-01-30 DIAGNOSIS — R200 Anesthesia of skin: Secondary | ICD-10-CM | POA: Diagnosis not present

## 2023-01-31 DIAGNOSIS — M5414 Radiculopathy, thoracic region: Secondary | ICD-10-CM | POA: Diagnosis not present

## 2023-01-31 DIAGNOSIS — G8929 Other chronic pain: Secondary | ICD-10-CM | POA: Diagnosis not present

## 2023-01-31 DIAGNOSIS — M898X1 Other specified disorders of bone, shoulder: Secondary | ICD-10-CM | POA: Diagnosis not present

## 2023-01-31 DIAGNOSIS — M546 Pain in thoracic spine: Secondary | ICD-10-CM | POA: Diagnosis not present

## 2023-01-31 DIAGNOSIS — M542 Cervicalgia: Secondary | ICD-10-CM | POA: Diagnosis not present

## 2023-01-31 DIAGNOSIS — M5412 Radiculopathy, cervical region: Secondary | ICD-10-CM | POA: Diagnosis not present

## 2023-02-03 ENCOUNTER — Other Ambulatory Visit: Payer: Self-pay | Admitting: Neurology

## 2023-02-03 DIAGNOSIS — R202 Paresthesia of skin: Secondary | ICD-10-CM

## 2023-02-03 DIAGNOSIS — G35 Multiple sclerosis: Secondary | ICD-10-CM

## 2023-02-10 ENCOUNTER — Encounter: Payer: Self-pay | Admitting: Neurology

## 2023-02-10 DIAGNOSIS — R0781 Pleurodynia: Secondary | ICD-10-CM | POA: Diagnosis not present

## 2023-02-10 DIAGNOSIS — M5134 Other intervertebral disc degeneration, thoracic region: Secondary | ICD-10-CM | POA: Diagnosis not present

## 2023-02-16 ENCOUNTER — Ambulatory Visit
Admission: RE | Admit: 2023-02-16 | Discharge: 2023-02-16 | Disposition: A | Discharge status: Home / Self Care | Payer: 59 | Source: Ambulatory Visit | Attending: Neurology | Admitting: Neurology

## 2023-02-16 DIAGNOSIS — I6389 Other cerebral infarction: Secondary | ICD-10-CM | POA: Diagnosis not present

## 2023-02-16 DIAGNOSIS — D354 Benign neoplasm of pineal gland: Secondary | ICD-10-CM | POA: Diagnosis not present

## 2023-02-16 DIAGNOSIS — R202 Paresthesia of skin: Secondary | ICD-10-CM

## 2023-02-16 DIAGNOSIS — G35 Multiple sclerosis: Secondary | ICD-10-CM

## 2023-02-21 NOTE — Therapy (Unsigned)
OUTPATIENT PHYSICAL THERAPY EVALUATION   Patient Name: Chase Stewart MRN: [id number] DOB:17-Jun-1985, 38 y.o., male Today's Date: 02/21/2023  END OF SESSION:   Past Medical History:  Diagnosis Date   Anxiety    Chronic kidney disease    h/o kidney stones   Depression    Migraines    Past Surgical History:  Procedure Laterality Date   ROTATOR CUFF REPAIR Right    SHOULDER ARTHROSCOPY WITH OPEN ROTATOR CUFF REPAIR AND DISTAL CLAVICLE ACROMINECTOMY Left 05/26/2015   Procedure: SHOULDER ARTHROSCOPY WITH ANTERIOR AND SUPERIOR LABRAL REPAIR;  Surgeon: Juanell Fairly, MD;  Location: [facility] ORS;  Service: Orthopedics;  Laterality: Left;   TONSILLECTOMY     TYMPANOSTOMY TUBE PLACEMENT     Patient Active Problem List   Diagnosis Date Noted   Depression 09/21/2022   Degeneration of thoracic intervertebral disc 06/17/2021   Anxiety 06/17/2021   Acute back pain 04/02/2020   Rib pain 04/02/2020   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   History of herpes labialis 12/28/2015   Shoulder pain 08/28/2015    PCP: ***  REFERRING PROVIDER: Elijah Birk, MD  REFERRING DIAG: cervicalgia, cervical radiculitis  THERAPY DIAG:  No diagnosis found.  Rationale for Evaluation and Treatment: Rehabilitation  ONSET DATE: ***  SUBJECTIVE:                                                                                                                                                                                                         SUBJECTIVE STATEMENT: Patient states he started having pain in his upper back for no apparent reason about 3 years ago. Since then he has been to a lot of doctors. In the past few months (3?) he has started to have more pain in his neck that radiates todwrads his shoulders. His mid to upper back is hurting the most now. He states it is mostly on his left side in his neck and his thoracic spine. He has seen infection control, rheumatology, cardiology, neurology (ruled out MS),  tried Gabapentin and is no Lyrica now (not helping). Has been tested for RA, Lupus, and other   He was bit by 6 ticks about 6 months prior to  Was tested twice for rocky mountain spotted fever and limes disease. Was positive and negative once in each of those, and was informed they are both false positives. He took medication for limes disease just in case. The infectious disease specialist told him he could not have chronic lime's disease.   Had an intercostal rib shot that felt like it did something.  Cat cow, elbows on the wall (temporary relief).   Goes to the gym 3x a week, mostly strength training, sometimes strength training. No back squats since he had this problem. Some overhead pressing (feels in his back).   Multiple Sclerosis? Ruled out  Provider notes from PT order: neck pain traveling into hte left arm due to disc herniation  Twice a year police physical assessment test; Has not been difficult. Push ups may be difficult now.   PERTINENT HISTORY:  Patient is a 38 y.o. male who presents to outpatient physical therapy with a referral for medical diagnosis cervicalgia, cervical radiculitis. This patient's chief complaints consist of ***, leading to the following functional deficits: ***. Relevant past medical history and comorbidities include Shoulder pain; Bradycardia; Acute back pain; Rib pain; Degeneration of thoracic intervertebral disc; Anxiety; and Depression; history of Anxiety, kidney stones, and Migraines. He  has a past surgical history that includes Rotator cuff repair (Right); Tonsillectomy; Tympanostomy tube placement; and Shoulder arthroscopy with open rotator cuff repair and distal clavicle acrominectomy (Left, 05/26/2015). Patient denies hx of cancer, stroke, seizures, lung problems, heart problems, diabetes, unexplained weight loss, unexplained changes in bowel or bladder problems, unexplained stumbling or dropping things, osteoporosis, and spinal surgery  PAIN: Are  you having pain? Yes NPRS:  Neck: Current: 2-3/10,  Best: 2/10, Worst: 8/10. L posterior shoulder/back: Current: 6/10,  Best: 2/10, Worst: 8/10. Pain location: upper cervical spine at base of scull, at left lower ribs, left scapular region, spreading to left shoulder. All worse on left compared to right. Every now and then has an ache in his left elbow (unsure if it has been felt in right elbow).  Pain description: achy, not shooting Aggravating factors: twisting to the right it catches  on the left like it is stuck, after working out Relieving factors: with movement, bullet proof vest, while working out, heat (hot showers) and can move when it is in the shower, ibuprofen (medication that helps the most), muscle relaxants, intercostal rib injection, wearing a posture corrector that squeezes his ribs.   FUNCTIONAL LIMITATIONS: any little thing, normal life, playing with kids, some days he can't play with his kids, he sucks it up at work, it affects everything he does in his daily life. When his wife wants to go out and do something, his first thought is he can probably last for 2-3 hours before he is done. Gaming, prolonged sitting.   LEISURE: working, gaming, playing with kids and doing things with the family  PRECAUTIONS: None  WEIGHT BEARING RESTRICTIONS: No  FALLS:  Has patient fallen in last 6 months? 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   EXAM: MRI CERVICAL SPINE WITHOUT CONTRAST   TECHNIQUE: Multiplanar, multisequence MR  imaging of the cervical spine was performed. No intravenous contrast was administered.   COMPARISON:  None Available.   FINDINGS: Alignment: Physiologic with preservation of the normal cervical lordosis. No listhesis.   Vertebrae: Vertebral body height maintained without acute or chronic fracture. Bone marrow signal intensity within normal limits. No discrete or worrisome osseous lesions. Mild degenerative reactive endplate changes present about the C6-7  interspace. No other abnormal marrow edema.   Cord: Normal signal and morphology.   Posterior Fossa, vertebral arteries, paraspinal tissues: Unremarkable.   Disc levels:   C2-C3: Unremarkable.   C3-C4: Tiny central disc protrusion minimally indents the ventral thecal sac. No canal or foraminal stenosis.   C4-C5: Central disc protrusion indents the ventral thecal sac, contacting and minimally flattening the ventral cord. No cord signal changes. Mild spinal stenosis. Superimposed uncovertebral spurring without significant foraminal encroachment.   C5-C6: Broad-based central to left paracentral disc protrusion indents the ventral thecal sac, contacting and mildly flattening the ventral cord, asymmetric to the left. No cord signal changes. Mild spinal stenosis. Superimposed uncovertebral spurring with resultant mild left C6 foraminal stenosis. Right neural foramina remains patent.   C6-C7: Degenerative intervertebral disc space narrowing with diffuse disc osteophyte complex. Flattening of the ventral thecal sac with resultant mild spinal stenosis. Mild to moderate right C7 foraminal stenosis. Left neural foramina remains patent.   C7-T1:  Unremarkable.   IMPRESSION: 1. Broad-based central to left paracentral disc protrusion at C5-6 with resultant mild spinal stenosis, with mild left C6 foraminal narrowing. 2. Degenerative disc osteophyte at C6-7 with resultant mild spinal stenosis, with mild to moderate right C7  foraminal narrowing. 3. Central disc protrusion at C4-5 with resultant mild spinal stenosis.  Thoracic spine MRI report from 11/12/2022:  IMPRESSION: 1. Left eccentric disc bulge at T6-7 results in mild deformity of the left ventral cord. 2. Small right central disc protrusion at T7-8. 3. No neural foraminal narrowing in the thoracic spine.  Brain MRI report from 02/16/2023:  IMPRESSION: 1. Left eccentric disc bulge at T6-7 results in mild deformity of the left ventral cord. 2. Small right central disc protrusion at T7-8. 3. No neural foraminal narrowing in the thoracic spine.  SELF-REPORTED FUNCTION Neck Disability Index (NDI): ***% (range 0-100%)  OBSERVATION/INSPECTION Posture Posture (seated): forward head, rounded shoulders, slumped in sitting.  Posture (standing): *** Posture correction: *** Anthropometrics Tremor: none Body composition: *** Muscle bulk: *** Skin: The incision sites appear to be healing well with no excessive redness, warmth, drainage or signs of infection present.  *** Edema: *** Functional Mobility Bed mobility: *** Transfers: *** Gait: grossly WFL for household and short community ambulation. More detailed gait analysis deferred to later date as needed. *** Stairs: ***  SPINE MOTION  LUMBAR SPINE AROM *Indicates pain Flexion: *** Extension: *** Side Flexion:   R ***  L *** Rotation:  R *** L *** Side glide:  R *** L ***    NEUROLOGICAL  Upper Motor Neuron Screen Babinski, Hoffman's and Clonus (ankle) negative bilaterally.  Dermatomes C2-T1 appears equal and intact to light touch except the following: *** L2-S2 appears equal and intact to light touch except the following: *** Deep Tendon Reflexes R/L  ***+/***+ Biceps brachii reflex (C5, C6) ***+/***+ Brachioradialis reflex (C6) ***+/***+ Triceps brachii reflex (C7) ***+/***+ Quadriceps reflex (L4) ***+/***+ Achilles reflex (S1)  SPINE MOTION  CERVICAL SPINE  AROM *Indicates pain Flexion: *** Extension: *** Side Flexion:   R ***  L *** Rotation:  R *** L *** Protraction: *** Retrusion: ***   PERIPHERAL JOINT MOTION (in degrees)  ACTIVE RANGE OF MOTION (AROM) *Indicates pain Date Date Date  Joint/Motion R/L R/L R/L  Shoulder     Flexion / / /  Extension / / /  Abduction  / / /  External rotation / / /  Internal rotation / / /  Elbow     Flexion  / / /  Extension  / / /  Wrist     Flexion / / /  Extension  / / /  Radial deviation / / /  Ulnar deviation / / /  Pronation / / /  Supination / / /  Hip     Flexion / / /  Extension  / / /  Abduction / / /  Adduction / / /  External rotation / / /  Internal rotation  / / /  Knee     Extension / / /  Flexoin / / /  Ankle/Foot     Dorsiflexion (knee ext) / / /  Dorsiflexion (knee flex) / / /  Plantarflexion / / /  Everison / / /  Inversion / / /  Great toe extension / / /  Great toe flexion / / /  Comments:   PASSIVE RANGE OF MOTION (PROM) *Indicates pain Date Date Date  Joint/Motion R/L R/L R/L  Shoulder     Flexion / / /  Extension / / /  Abduction  / / /  External rotation / / /  Internal rotation / / /  Elbow     Flexion  / / /  Extension  / / /  Wrist     Flexion / / /  Extension  / / /  Radial deviation / / /  Ulnar deviation / / /  Pronation / / /  Supination / / /  Hip     Flexion  / / /  Extension  / / /  Abduction / / /  Adduction / / /  External rotation / / /  Internal rotation  / / /  Knee     Extension / / /  Flexion / / /  Ankle/Foot     Dorsiflexion (knee ext) / / /  Dorsiflexion (knee flex) / / /  Plantarflexion / / /  Everison / / /  Inversion / / /  Great toe extension / / /  Great toe flexion / / /  Comments:   MUSCLE PERFORMANCE (MMT):  *Indicates pain Date Date Date  Joint/Motion R/L R/L R/L  Shoulder     Flexion / / /  Abduction (C5) / / /  External rotation / / /  Internal rotation / / /  Extension / / /   Elbow     Flexion (C6) / / /  Extension (C7) / / /  Wrist     Flexion (C7) / / /  Extension (C6) / / /  Radial deviation / / /  Ulnar deviation (C8) / / /  Pronation / / /  Supination / / /  Hand     Thumb extension (C8) / / /  Finger abduction (T1) / / /  Grip (C8) / / /  Hip     Flexion (L1, L2) / / /  Extension (knee ext) / / /  Extension (knee flex) / / /  Abduction / / /  Adduction / / /  External rotation / / /  Internal rotation  / / /  Knee     Extension (L3) / / /  Flexion (S2) / / /  Ankle/Foot     Dorsiflexion (L4) / / /  Great toe extension (L5) / / /  Eversion (S1) / / /  Plantarflexion (S1) / / /  Inversion / / /  Pronation / / /  Great toe flexion / / /  Comments:   SPECIAL TESTS:  .Neurodynamictests .NeurodynamicUE .NeurodynamicLE .CspineInstability .CSPINESPECIALTESTS .SHOULDERSPECIALTESTCLUSTERS .HIPSPECIALTESTS .SIJSPECIALTESTS   SHOULDER SPECIAL TESTS RTC, Impingement, Anterior Instability (macrotrauma), Labral Tear: Painful arc test: R = ***, L = ***. Drop arm test: R = ***, L = ***. Hawkins-Kennedy test: R = ***, L = ***. Infraspinatus test: R = ***, L = ***. Apprehension test: R = ***, L = ***. Relocation test: R = ***, L = ***. Active compression test: R = ***, L = ***.  ACCESSORY MOTION: ***  PALPATION: ***  SUSTAINED POSITIONS TESTING:  ***  REPEATED MOTIONS TESTING: ***  FUNCTIONAL/BALANCE TESTS: Five Time Sit to Stand (5TSTS): *** seconds Functional Gait Assessment (FGA): ***/30 (see details above) Ten meter walking trial ( ): *** m/s Six Minute Walk Test ( ): *** feet Timed Up and Go (TUG): *** seconds   Dynamic Gait Index: ***/24 BERG Balance Scale: ***/56 Tinetti/POMA: ***/28 Timed Up and GO: *** seconds (average of 3 trials) Trial 1: *** Trial 2: *** Trial 3: *** Romberg test: -Narrow stance, eyes open: *** seconds -Narrow stance, eyes closed: *** seconds Sharpened Romberg test: -Tandem  stance, eyes open: *** seconds -Tandem stance, eyes closed: *** seconds  Narrow stance, firm surface, eyes open: *** seconds Narrow stance, firm surface, eyes closed: *** seconds Narrow stance, compliant surface, eyes open: *** seconds Narrow stance, compliant surface, eyes closed: *** seconds Single leg stance, firm surface, eyes open: R= *** seconds, L= *** seconds Single leg stance, compliant surface, eyes open: R= *** seconds, L= *** seconds Gait speed: *** m/s Functional reach test: *** inches   TREATMENT:                                                                                                                              PATIENT EDUCATION:  Education details: *** Person educated: {Person educated:25204} Education method: {Education Method:25205} Education comprehension: {Education Comprehension:25206}  HOME EXERCISE PROGRAM: ***  ASSESSMENT:  CLINICAL IMPRESSION: Patient is a 38 y.o. male referred to outpatient physical therapy with a medical diagnosis of cervicalgia, cervical radiculitis who presents with signs and symptoms consistent with ***. Patient presents with significant *** impairments that are limiting ability to complete *** without difficulty. Patient will benefit from skilled physical therapy intervention to address current body structure impairments and activity limitations to improve function and work towards goals set in current POC in order to return to prior level of function or maximal functional improvement.    OBJECTIVE IMPAIRMENTS: {opptimpairments:25111}.   ACTIVITY LIMITATIONS: {activitylimitations:27494}  PARTICIPATION LIMITATIONS: {participationrestrictions:25113}  PERSONAL FACTORS: {Personal factors:25162} are also affecting patient's functional outcome.   REHAB POTENTIAL: {rehabpotential:25112}  CLINICAL DECISION MAKING: {clinical decision making:25114}  EVALUATION COMPLEXITY: {Evaluation complexity:25115}   GOALS: Goals reviewed  with patient? {yes/no:20286}  SHORT TERM GOALS: Target date: 03/07/2023  Patient will be independent with initial home exercise program for self-management of symptoms. Baseline: {HEPbaseline4:27310} (02/21/23); Goal status: INITIAL  Patient will demonstrate improvement in Patient Specific Functional  Scale (PSFS) of equal or greater than 3 points to reflect clinically significant improvement in patient's most valued functional activities.. Baseline: {Sarasgoalbaseline:32234} (02/21/23); Goal status: INITIAL  2. Patient will demonstrate improvement in Neck Disability Index (NDI) by equal or greater than 10 percentage points (MCID) to reflect clinically significant improvement in overall condition and self-reported functional ability. Baseline: {Sarasgoalbaseline:32234} (02/21/23); Goal status: INITIAL  3. Patient will report improvement in NPRS of equal or greater than 2 points during functional activities to improve their abilitly to complete community, work and/or recreational activities with less limitation. Baseline: ***/10 (02/21/23); Goal status: INITIAL   LONG TERM GOALS: Target date: 05/16/2023  Patient will be independent with a long-term home exercise program for self-management of symptoms.  Baseline: {HEPbaseline4:27310} (02/21/23); Goal status: INITIAL  2.  Patient will demonstrate improved Neck Disability Index (NDI) to equal or less than 10% to demonstrate improvement in overall condition and self-reported functional ability.  Baseline: {Sarasgoalbaseline:32234} (02/21/23); Goal status: INITIAL  3.  *** Baseline: {Sarasgoalbaseline:32234} (02/21/23); Goal status: INITIAL  4.  *** Baseline: {Sarasgoalbaseline:32234} (02/21/23); Goal status: INITIAL  5.  Patient will demonstrate improvement in Patient Specific Functional Scale (PSFS) of equal or greater than 8/10 points to reflect clinically significant improvement in patient's most valued functional  activities. Baseline: {Sarasgoalbaseline:32234} (02/21/23); Goal status: INITIAL  5.  Patient will report NPRS equal or less than 3/10 during functional activities during the last 2 weeks to improve their abilitly to complete community, work and/or recreational activities with less limitation. Baseline: ***/10 (02/21/23); Goal status: INITIAL    PLAN:  PT FREQUENCY: {rehab frequency:25116}  PT DURATION: {rehab duration:25117}  PLANNED INTERVENTIONS: {rehab planned interventions:25118::"97110-Therapeutic exercises","97530- Therapeutic [id number]- Neuromuscular re-education","97535- Self JXBJ","47829- Manual therapy"}  PLAN FOR NEXT SESSION: ***   Osmany Azer R. Ilsa Iha, PT, DPT 02/21/23, 4:39 PM  [facility] [address] [place], [place] [address] P: [phone] I F: [phone]

## 2023-02-22 ENCOUNTER — Encounter: Payer: Self-pay | Admitting: Physical Therapy

## 2023-02-22 ENCOUNTER — Ambulatory Visit: Payer: 59 | Attending: Physical Medicine & Rehabilitation | Admitting: Physical Therapy

## 2023-02-22 DIAGNOSIS — M542 Cervicalgia: Secondary | ICD-10-CM | POA: Diagnosis not present

## 2023-02-22 DIAGNOSIS — M546 Pain in thoracic spine: Secondary | ICD-10-CM | POA: Diagnosis not present

## 2023-02-22 DIAGNOSIS — M792 Neuralgia and neuritis, unspecified: Secondary | ICD-10-CM

## 2023-02-22 DIAGNOSIS — M6281 Muscle weakness (generalized): Secondary | ICD-10-CM | POA: Diagnosis not present
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
## 2023-03-01 ENCOUNTER — Ambulatory Visit: Payer: 59 | Admitting: Physical Therapy

## 2023-03-02 DIAGNOSIS — H52223 Regular astigmatism, bilateral: Secondary | ICD-10-CM | POA: Diagnosis not present

## 2023-03-05 ENCOUNTER — Encounter: Payer: Self-pay | Admitting: Pain Medicine

## 2023-03-05 DIAGNOSIS — M899 Disorder of bone, unspecified: Secondary | ICD-10-CM | POA: Insufficient documentation

## 2023-03-05 DIAGNOSIS — Z789 Other specified health status: Secondary | ICD-10-CM | POA: Insufficient documentation

## 2023-03-05 DIAGNOSIS — Z79899 Other long term (current) drug therapy: Secondary | ICD-10-CM | POA: Insufficient documentation

## 2023-03-05 DIAGNOSIS — G894 Chronic pain syndrome: Secondary | ICD-10-CM | POA: Insufficient documentation

## 2023-03-05 NOTE — Progress Notes (Unsigned)
 Patient: Chase Stewart  Service Category: E/M  Provider: Oswaldo Done, MD  DOB: [birth year]  DOS: 03/06/2023  Referring Provider: Elijah Birk, MD  MRN: [id number]  Setting: Ambulatory outpatient  PCP: Patient, No Pcp Per  Type: New Patient  Specialty: Interventional Pain Management    Location: Office  Delivery: Face-to-face     Primary Reason(s) for Visit: Encounter for initial evaluation of one or more chronic problems (new to examiner) potentially causing chronic pain, and posing a threat to normal musculoskeletal function. (Level of risk: High) CC: No chief complaint on file.  HPI  Chase Stewart is a 38 y.o. year old, male patient, who comes for the first time to our practice referred by Chase Jungling I, MD for our initial evaluation of his chronic pain. He has Shoulder pain; Acute back pain; Rib pain; History of herpes labialis; Degeneration of thoracic intervertebral disc; Anxiety; Depression; Chronic pain syndrome; Pharmacologic therapy; Disorder of skeletal system; and Problems influencing health status on their problem list. Today he comes in for evaluation of his No chief complaint on file.  Pain Assessment: Location:     Radiating:   Onset:   Duration:   Quality:   Severity:  /10 (subjective, self-reported pain score)  Effect on ADL:   Timing:   Modifying factors:   BP:    HR:    Onset and Duration: {Hx; Onset and Duration:210120511} Cause of pain: {Hx; Cause:210120521} Severity: {Pain Severity:210120502} Timing: {Symptoms; Timing:210120501} Aggravating Factors: {Causes; Aggravating pain factors:210120507} Alleviating Factors: {Causes; Alleviating Factors:210120500} Associated Problems: {Hx; Associated problems:210120515} Quality of Pain: {Hx; Symptom quality or Descriptor:210120531} Previous Examinations or Tests: {Hx; Previous examinations or test:210120529} Previous Treatments: {Hx; Previous Treatment:210120503}  Chase Stewart is being evaluated for  possible interventional pain management therapies for the treatment of his chronic pain.  Discussed the use of AI scribe software for clinical note transcription with the patient, who gave verbal consent to proceed.  History of Present Illness           *** Chase Stewart has been informed that this initial visit was an evaluation only.  On the follow up appointment Stewart will go over the results, including ordered tests and available interventional therapies. At that time he will have the opportunity to decide whether to proceed with offered therapies or not. In the event that Chase Stewart prefers avoiding interventional options, this will conclude our involvement in the case.  Medication management recommendations may be provided upon request.  Patient informed that diagnostic tests may be ordered to assist in identifying underlying causes, narrow the list of differential diagnoses and aid in determining candidacy for (or contraindications to) planned therapeutic interventions.  Historic Controlled Substance Pharmacotherapy Review  PMP and historical list of controlled substances: Pregabalin 25 Mg Capsule ; Gabapentin 300 Mg Capsule ;Diazepam 5 Mg Tablet  Most recently prescribed opioid analgesics:   *** MME/day: *** mg/day  Historical Monitoring: The patient  reports that he does not currently use drugs after having used the following drugs: Marijuana. List of prior UDS Testing: No results found for: "MDMA", "COCAINSCRNUR", "PCPSCRNUR", "PCPQUANT", "CANNABQUANT", "THCU", "ETH", "CBDTHCR", "D8THCCBX", "D9THCCBX" Historical Background Evaluation: [place] PMP: PDMP reviewed during this encounter. Review of the past 15-months conducted.             PMP NARX Score Report:  Narcotic: 070 Sedative: 130 Stimulant: 000 [place] Department of public safety, offender search: Engineer, mining Information) Non-contributory Risk Assessment Profile: Aberrant behavior: None observed or detected today Risk factors for fatal  opioid  overdose: None identified today PMP NARX Overdose Risk Score: 380 Fatal overdose hazard ratio (HR): Calculation deferred Non-fatal overdose hazard ratio (HR): Calculation deferred Risk of opioid abuse or dependence: 0.7-3.0% with doses <= 36 MME/day and 6.1-26% with doses >= 120 MME/day. Substance use disorder (SUD) risk level: See below Personal History of Substance Abuse (SUD-Substance use disorder):  Alcohol:    Illegal Drugs:    Rx Drugs:    ORT Risk Level calculation:    ORT Scoring interpretation table:  Score <3 = Low Risk for SUD  Score between 4-7 = Moderate Risk for SUD  Score >8 = High Risk for Opioid Abuse   PHQ-2 Depression Scale:  Total score:    PHQ-2 Scoring interpretation table: (Score and probability of major depressive disorder)  Score 0 = No depression  Score 1 = 15.4% Probability  Score 2 = 21.1% Probability  Score 3 = 38.4% Probability  Score 4 = 45.5% Probability  Score 5 = 56.4% Probability  Score 6 = 78.6% Probability   PHQ-9 Depression Scale:  Total score:    PHQ-9 Scoring interpretation table:  Score 0-4 = No depression  Score 5-9 = Mild depression  Score 10-14 = Moderate depression  Score 15-19 = Moderately severe depression  Score 20-27 = Severe depression (2.4 times higher risk of SUD and 2.89 times higher risk of overuse)   Pharmacologic Plan: As per protocol, Stewart have not taken over any controlled substance management, pending the results of ordered tests and/or consults.            Initial impression: Pending review of available data and ordered tests.  Meds   Current Outpatient Medications:    ALPHA LIPOIC ACID PO, Take by mouth daily., Disp: , Rfl:    gabapentin (NEURONTIN) 300 MG capsule, Take 300 mg twice daily for a week and then increase to 300 mg in the morning and 600 mg in the evening for back pain. (Patient not taking: Reported on 02/22/2023), Disp: , Rfl:    lidocaine (LIDODERM) 5 %, Place 1 patch onto the skin daily. Remove &  Discard patch within 12 hours or as directed by MD (Patient not taking: Reported on 02/22/2023), Disp: 30 patch, Rfl: 0   orphenadrine (NORFLEX) 100 MG tablet, Take 1 tablet (100 mg total) by mouth 2 (two) times daily., Disp: 10 tablet, Rfl: 3   PREGABALIN PO, Take by mouth., Disp: , Rfl:    TURMERIC PO, Take by mouth daily., Disp: , Rfl:    valACYclovir (VALTREX) 1000 MG tablet, Take 1 tablet (1,000 mg total) by mouth 2 (two) times daily., Disp: 4 tablet, Rfl: 2  Imaging Review  Cervical Imaging: Cervical MR wo contrast: Results for orders placed during the hospital encounter of 01/12/23  MR CERVICAL SPINE WO CONTRAST  Narrative CLINICAL DATA:  Initial evaluation for neck pain with radiation into the left upper extremity, mid back pain.  EXAM: MRI CERVICAL SPINE WITHOUT CONTRAST  TECHNIQUE: Multiplanar, multisequence MR imaging of the cervical spine was performed. No intravenous contrast was administered.  COMPARISON:  None Available.  FINDINGS: Alignment: Physiologic with preservation of the normal cervical lordosis. No listhesis.  Vertebrae: Vertebral body height maintained without acute or chronic fracture. Bone marrow signal intensity within normal limits. No discrete or worrisome osseous lesions. Mild degenerative reactive endplate changes present about the C6-7 interspace. No other abnormal marrow edema.  Cord: Normal signal and morphology.  Posterior Fossa, vertebral arteries, paraspinal tissues: Unremarkable.  Disc levels:  C2-C3:  Unremarkable.  C3-C4: Tiny central disc protrusion minimally indents the ventral thecal sac. No canal or foraminal stenosis.  C4-C5: Central disc protrusion indents the ventral thecal sac, contacting and minimally flattening the ventral cord. No cord signal changes. Mild spinal stenosis. Superimposed uncovertebral spurring without significant foraminal encroachment.  C5-C6: Broad-based central to left paracentral disc  protrusion indents the ventral thecal sac, contacting and mildly flattening the ventral cord, asymmetric to the left. No cord signal changes. Mild spinal stenosis. Superimposed uncovertebral spurring with resultant mild left C6 foraminal stenosis. Right neural foramina remains patent.  C6-C7: Degenerative intervertebral disc space narrowing with diffuse disc osteophyte complex. Flattening of the ventral thecal sac with resultant mild spinal stenosis. Mild to moderate right C7 foraminal stenosis. Left neural foramina remains patent.  C7-T1:  Unremarkable.  IMPRESSION: 1. Broad-based central to left paracentral disc protrusion at C5-6 with resultant mild spinal stenosis, with mild left C6 foraminal narrowing. 2. Degenerative disc osteophyte at C6-7 with resultant mild spinal stenosis, with mild to moderate right C7 foraminal narrowing. 3. Central disc protrusion at C4-5 with resultant mild spinal stenosis.   Electronically Signed By: Rise Mu M.D. On: 01/16/2023 18:30  Cervical MR wo contrast: No results found for this or any previous visit.  Cervical MR w/wo contrast: No results found for this or any previous visit.  Cervical MR w contrast: No results found for this or any previous visit.  Cervical CT wo contrast: No results found for this or any previous visit.  Cervical CT w/wo contrast: No results found for this or any previous visit.  Cervical CT w/wo contrast: No results found for this or any previous visit.  Cervical CT w contrast: No results found for this or any previous visit.  Cervical CT outside: No results found for this or any previous visit.  Cervical DG 1 view: No results found for this or any previous visit.  Cervical DG 2-3 views: No results found for this or any previous visit.  Cervical DG F/E views: No results found for this or any previous visit.  Cervical DG 2-3 clearing views: No results found for this or any previous  visit.  Cervical DG Bending/F/E views: No results found for this or any previous visit.  Cervical DG complete: No results found for this or any previous visit.  Cervical DG Myelogram views: No results found for this or any previous visit.  Cervical DG Myelogram views: No results found for this or any previous visit.  Cervical Discogram views: No results found for this or any previous visit.   Shoulder Imaging: Shoulder-R MR w contrast: No results found for this or any previous visit.  Shoulder-L MR w contrast: No results found for this or any previous visit.  Shoulder-R MR w/wo contrast: No results found for this or any previous visit.  Shoulder-L MR w/wo contrast: No results found for this or any previous visit.  Shoulder-R MR wo contrast: No results found for this or any previous visit.  Shoulder-L MR wo contrast: Results for orders placed during the hospital encounter of 12/16/14  MR Shoulder Left Wo Contrast  Narrative CLINICAL DATA:  No surgery.  Pain at the top of the shoulder.  EXAM: MRI OF THE LEFT SHOULDER WITHOUT CONTRAST  TECHNIQUE: Multiplanar, multisequence MR imaging of the shoulder was performed. No intravenous contrast was administered.  COMPARISON:  None.  FINDINGS: Rotator cuff: Mild tendinosis and fraying along the bursal surface involving the anterior aspect of the supraspinatus tendon at its insertion. Infraspinatus  tendon is intact. Teres minor tendon is intact. Subscapularis tendon is intact.  Muscles: No atrophy or fatty replacement of nor abnormal signal within, the muscles of the rotator cuff.  Biceps long head:  Intact.  Acromioclavicular Joint: Mild degenerative changes of the acromioclavicular joint. Type Stewart acromion. No significant subacromial/ subdeltoid bursal fluid.  Glenohumeral Joint: No joint effusion. No chondral defect. No dislocation.  Labrum: Limited evaluation secondary to lack of intra-articular contrast. Posterior  labral tear.  Bones: No marrow signal abnormality. No acute osseous abnormality. No aggressive osseous lesion.  IMPRESSION: 1. Mild tendinosis and fraying along the bursal surface involving the anterior aspect of the supraspinatus tendon at its insertion. 2. Mild degenerative changes of the acromioclavicular joint.   Electronically Signed By: Elige Ko On: 12/16/2014 11:21  Shoulder-R CT w contrast: No results found for this or any previous visit.  Shoulder-L CT w contrast: No results found for this or any previous visit.  Shoulder-R CT w/wo contrast: No results found for this or any previous visit.  Shoulder-L CT w/wo contrast: No results found for this or any previous visit.  Shoulder-R CT wo contrast: No results found for this or any previous visit.  Shoulder-L CT wo contrast: No results found for this or any previous visit.  Shoulder-R DG Arthrogram: No results found for this or any previous visit.  Shoulder-L DG Arthrogram: No results found for this or any previous visit.  Shoulder-R DG 1 view: No results found for this or any previous visit.  Shoulder-L DG 1 view: No results found for this or any previous visit.  Shoulder-R DG: No results found for this or any previous visit.  Shoulder-L DG: Results for orders placed during the hospital encounter of 10/07/14  DG Shoulder Left  Narrative CLINICAL DATA:  Left shoulder pain.  EXAM: LEFT SHOULDER - 2+ VIEW  COMPARISON:  None.  FINDINGS: There is no evidence of fracture or dislocation. There is no evidence of arthropathy or other focal bone abnormality. Soft tissues are unremarkable.  IMPRESSION: Negative.   Electronically Signed By: Elige Ko On: 10/07/2014 14:53   Thoracic Imaging: Thoracic MR wo contrast: Results for orders placed during the hospital encounter of 11/12/22  MR THORACIC SPINE WO CONTRAST  Narrative CLINICAL DATA:  Thoracic radiculitis. Mid back pain radiating to both  sides.  EXAM: MRI THORACIC SPINE WITHOUT CONTRAST  TECHNIQUE: Multiplanar, multisequence MR imaging of the thoracic spine was performed. No intravenous contrast was administered.  COMPARISON:  None Available.  FINDINGS: Alignment:  Normal.  Vertebrae: No fracture, evidence of discitis, or bone lesion.  Cord:  Normal spinal cord signal and volume.  Paraspinal and other soft tissues: Unremarkable.  Disc levels:  Left eccentric disc bulge at T6-7 results in mild deformity of the left ventral cord. Small right central disc protrusion at T7-8. No neural foraminal narrowing in the thoracic spine.  IMPRESSION: 1. Left eccentric disc bulge at T6-7 results in mild deformity of the left ventral cord. 2. Small right central disc protrusion at T7-8. 3. No neural foraminal narrowing in the thoracic spine.   Electronically Signed By: Orvan Falconer M.D. On: 11/28/2022 11:25  Thoracic MR wo contrast: No results found for this or any previous visit.  Thoracic MR w/wo contrast: No results found for this or any previous visit.  Thoracic MR w contrast: No results found for this or any previous visit.  Thoracic CT wo contrast: No results found for this or any previous visit.  Thoracic CT w/wo  contrast: No results found for this or any previous visit.  Thoracic CT w/wo contrast: No results found for this or any previous visit.  Thoracic CT w contrast: No results found for this or any previous visit.  Thoracic DG 2-3 views: Results for orders placed during the hospital encounter of 03/13/20  DG Thoracic Spine 2 View  Narrative CLINICAL DATA:  Upper back pain.  EXAM: THORACIC SPINE 2 VIEWS  COMPARISON:  None.  FINDINGS: There is no evidence of thoracic spine fracture. Alignment is normal. No other significant bone abnormalities are identified.  IMPRESSION: Negative.   Electronically Signed By: Lupita Raider M.D. On: 03/13/2020 09:47  Thoracic DG 4 views: No  results found for this or any previous visit.  Thoracic DG: No results found for this or any previous visit.  Thoracic DG w/swimmers view: No results found for this or any previous visit.  Thoracic DG Myelogram views: No results found for this or any previous visit.  Thoracic DG Myelogram views: No results found for this or any previous visit.   Lumbosacral Imaging: Lumbar MR wo contrast: No results found for this or any previous visit.  Lumbar MR wo contrast: No results found for this or any previous visit.  Lumbar MR w/wo contrast: No results found for this or any previous visit.  Lumbar MR w/wo contrast: No results found for this or any previous visit.  Lumbar MR w contrast: No results found for this or any previous visit.  Lumbar CT wo contrast: No results found for this or any previous visit.  Lumbar CT w/wo contrast: No results found for this or any previous visit.  Lumbar CT w/wo contrast: No results found for this or any previous visit.  Lumbar CT w contrast: No results found for this or any previous visit.  Lumbar DG 1V: No results found for this or any previous visit.  Lumbar DG 1V (Clearing): No results found for this or any previous visit.  Lumbar DG 2-3V (Clearing): No results found for this or any previous visit.  Lumbar DG 2-3 views: No results found for this or any previous visit.  Lumbar DG (Complete) 4+V: No results found for this or any previous visit.        Lumbar DG F/E views: No results found for this or any previous visit.        Lumbar DG Bending views: No results found for this or any previous visit.        Lumbar DG Myelogram views: No results found for this or any previous visit.  Lumbar DG Myelogram: No results found for this or any previous visit.  Lumbar DG Myelogram: No results found for this or any previous visit.  Lumbar DG Myelogram: No results found for this or any previous visit.  Lumbar DG Myelogram Lumbosacral: No results  found for this or any previous visit.  Lumbar DG Diskogram views: No results found for this or any previous visit.  Lumbar DG Diskogram views: No results found for this or any previous visit.  Lumbar DG Epidurogram OP: No results found for this or any previous visit.  Lumbar DG Epidurogram IP: No results found for this or any previous visit.   Sacroiliac Joint Imaging: Sacroiliac Joint DG: No results found for this or any previous visit.  Sacroiliac Joint MR w/wo contrast: No results found for this or any previous visit.  Sacroiliac Joint MR wo contrast: No results found for this or any previous visit.   Spine  Imaging: Whole Spine DG Myelogram views: No results found for this or any previous visit.  Whole Spine MR Mets screen: No results found for this or any previous visit.  Whole Spine MR Mets screen: No results found for this or any previous visit.  Whole Spine MR w/wo: No results found for this or any previous visit.  MRA Spinal Canal w/ cm: No results found for this or any previous visit.  MRA Spinal Canal wo/ cm: No results found for this or any previous visit.  MRA Spinal Canal w/wo cm: No results found for this or any previous visit.  Spine Outside MR Films: No results found for this or any previous visit.  Spine Outside CT Films: No results found for this or any previous visit.  CT-Guided Biopsy: No results found for this or any previous visit.  CT-Guided Needle Placement: No results found for this or any previous visit.  DG Spine outside: No results found for this or any previous visit.  IR Spine outside: No results found for this or any previous visit.  NM Spine outside: No results found for this or any previous visit.   Hip Imaging: Hip-R MR w contrast: No results found for this or any previous visit.  Hip-L MR w contrast: No results found for this or any previous visit.  Hip-R MR w/wo contrast: No results found for this or any previous  visit.  Hip-L MR w/wo contrast: No results found for this or any previous visit.  Hip-R MR wo contrast: No results found for this or any previous visit.  Hip-L MR wo contrast: No results found for this or any previous visit.  Hip-R CT w contrast: No results found for this or any previous visit.  Hip-L CT w contrast: No results found for this or any previous visit.  Hip-R CT w/wo contrast: No results found for this or any previous visit.  Hip-L CT w/wo contrast: No results found for this or any previous visit.  Hip-R CT wo contrast: No results found for this or any previous visit.  Hip-L CT wo contrast: No results found for this or any previous visit.  Hip-R DG 2-3 views: No results found for this or any previous visit.  Hip-L DG 2-3 views: No results found for this or any previous visit.  Hip-R DG Arthrogram: No results found for this or any previous visit.  Hip-L DG Arthrogram: No results found for this or any previous visit.  Hip-B DG Bilateral: No results found for this or any previous visit.  Hip-B DG Bilateral (5V): No results found for this or any previous visit.   Knee Imaging: Knee-R MR w contrast: No results found for this or any previous visit.  Knee-L MR w/o contrast: No results found for this or any previous visit.  Knee-R MR w/wo contrast: No results found for this or any previous visit.  Knee-L MR w/wo contrast: No results found for this or any previous visit.  Knee-R MR wo contrast: No results found for this or any previous visit.  Knee-L MR wo contrast: No results found for this or any previous visit.  Knee-R CT w contrast: No results found for this or any previous visit.  Knee-L CT w contrast: No results found for this or any previous visit.  Knee-R CT w/wo contrast: No results found for this or any previous visit.  Knee-L CT w/wo contrast: No results found for this or any previous visit.  Knee-R CT wo contrast: No results found  for this or any  previous visit.  Knee-L CT wo contrast: No results found for this or any previous visit.  Knee-R DG 1-2 views: No results found for this or any previous visit.  Knee-L DG 1-2 views: No results found for this or any previous visit.  Knee-R DG 3 views: No results found for this or any previous visit.  Knee-L DG 3 views: No results found for this or any previous visit.  Knee-R DG 4 views: No results found for this or any previous visit.  Knee-L DG 4 views: No results found for this or any previous visit.  Knee-R DG Arthrogram: No results found for this or any previous visit.  Knee-L DG Arthrogram: No results found for this or any previous visit.   Ankle Imaging: Ankle-R DG Complete: No results found for this or any previous visit.  Ankle-L DG Complete: No results found for this or any previous visit.   Foot Imaging: Foot-R DG Complete: No results found for this or any previous visit.  Foot-L DG Complete: No results found for this or any previous visit.   Elbow Imaging: Elbow-R DG Complete: No results found for this or any previous visit.  Elbow-L DG Complete: No results found for this or any previous visit.   Wrist Imaging: Wrist-R DG Complete: No results found for this or any previous visit.  Wrist-L DG Complete: No results found for this or any previous visit.   Hand Imaging: Hand-R DG Complete: No results found for this or any previous visit.  Hand-L DG Complete: No results found for this or any previous visit.   Complexity Note: Imaging results reviewed.                         ROS  Cardiovascular: {Hx; Cardiovascular History:210120525} Pulmonary or Respiratory: {Hx; Pumonary and/or Respiratory History:210120523} Neurological: {Hx; Neurological:210120504} Psychological-Psychiatric: {Hx; Psychological-Psychiatric History:210120512} Gastrointestinal: {Hx; Gastrointestinal:210120527} Genitourinary: {Hx; Genitourinary:210120506} Hematological: {Hx;  Hematological:210120510} Endocrine: {Hx; Endocrine history:210120509} Rheumatologic: {Hx; Rheumatological:210120530} Musculoskeletal: {Hx; Musculoskeletal:210120528} Work History: {Hx; Work history:210120514}  Allergies  Mr. Holloran has no known allergies.  Laboratory Chemistry Profile   Renal Lab Results  Component Value Date   BUN 12 03/16/2022   CREATININE 1.03 03/16/2022   BCR 12 03/16/2022   GFRAA 101 05/23/2019   GFRNONAA >60 01/25/2020   SPECGRAV 1.025 03/16/2022   PHUR 6.0 03/16/2022   PROTEINUR Negative 03/16/2022     Electrolytes Lab Results  Component Value Date   NA 140 03/16/2022   K 3.9 03/16/2022   CL 104 03/16/2022   CALCIUM 9.4 03/16/2022   PHOS 3.5 03/16/2022     Hepatic Lab Results  Component Value Date   AST 19 03/16/2022   ALT 14 03/16/2022   ALBUMIN 4.7 03/16/2022   ALKPHOS 67 03/16/2022     ID Lab Results  Component Value Date   LYMEIGGIGMAB <0.91 01/21/2020   HIV NON-REACTIVE 04/23/2020   SARSCOV2NAA Not Detected 02/23/2021   RMSFIGG Negative 02/21/2020     Bone Lab Results  Component Value Date   TESTOFREE 9.0 05/15/2020   TESTOSTERONE 532 05/15/2020     Endocrine Lab Results  Component Value Date   GLUCOSE 82 03/16/2022   GLUCOSEU NEGATIVE 10/05/2009   HGBA1C 4.8 04/23/2020   TSH 4.060 07/13/2022   FREET4 1.18 05/15/2020   TESTOFREE 9.0 05/15/2020   TESTOSTERONE 532 05/15/2020   SHBG 39.7 05/15/2020     Neuropathy Lab Results  Component Value Date   HGBA1C  4.8 04/23/2020   HIV NON-REACTIVE 04/23/2020     CNS No results found for: "COLORCSF", "APPEARCSF", "RBCCOUNTCSF", "WBCCSF", "POLYSCSF", "LYMPHSCSF", "EOSCSF", "PROTEINCSF", "GLUCCSF", "JCVIRUS", "CSFOLI", "IGGCSF", "LABACHR", "ACETBL"   Inflammation (CRP: Acute  ESR: Chronic) Lab Results  Component Value Date   CRP 2.2 04/01/2020   ESRSEDRATE 6 04/01/2020     Rheumatology Lab Results  Component Value Date   RF <14 04/01/2020   ANA NEGATIVE 04/01/2020    LABURIC 5.1 03/16/2022   LYMEIGGIGMAB <0.91 01/21/2020   LYMEABIGMQN <0.80 01/21/2020     Coagulation Lab Results  Component Value Date   INR 1.04 05/21/2015   LABPROT 13.8 05/21/2015   APTT 28 05/21/2015   PLT 202 03/16/2022     Cardiovascular Lab Results  Component Value Date   CKTOTAL 87 01/25/2020   HGB 14.0 03/16/2022   HCT 39.6 03/16/2022     Screening Lab Results  Component Value Date   SARSCOV2NAA Not Detected 02/23/2021   HIV NON-REACTIVE 04/23/2020     Cancer No results found for: "CEA", "CA125", "LABCA2"   Allergens No results found for: "ALMOND", "APPLE", "ASPARAGUS", "AVOCADO", "BANANA", "BARLEY", "BASIL", "BAYLEAF", "GREENBEAN", "LIMABEAN", "WHITEBEAN", "BEEFIGE", "REDBEET", "BLUEBERRY", "BROCCOLI", "CABBAGE", "MELON", "CARROT", "CASEIN", "CASHEWNUT", "CAULIFLOWER", "CELERY"     Note: Lab results reviewed.  PFSH  Drug: Mr. Cisse  reports that he does not currently use drugs after having used the following drugs: Marijuana. Alcohol:  reports no history of alcohol use. Tobacco:  reports that he has never smoked. He has never used smokeless tobacco. Medical:  has a past medical history of Anxiety, Bradycardia (01/16/2020), Chronic kidney disease, Depression, and Migraines. Family: family history includes CAD in his father; Hyperthyroidism in his father.  Past Surgical History:  Procedure Laterality Date   ROTATOR CUFF REPAIR Right    SHOULDER ARTHROSCOPY WITH OPEN ROTATOR CUFF REPAIR AND DISTAL CLAVICLE ACROMINECTOMY Left 05/26/2015   Procedure: SHOULDER ARTHROSCOPY WITH ANTERIOR AND SUPERIOR LABRAL REPAIR;  Surgeon: Juanell Fairly, MD;  Location: [facility];  Service: Orthopedics;  Laterality: Left;   TONSILLECTOMY     TYMPANOSTOMY TUBE PLACEMENT     Active Ambulatory Problems    Diagnosis Date Noted   Shoulder pain 08/28/2015   Acute back pain 04/02/2020   Rib pain 04/02/2020   History of herpes labialis 12/28/2015   Degeneration of thoracic  intervertebral disc 06/17/2021   Anxiety 06/17/2021   Depression 09/21/2022   Chronic pain syndrome 03/05/2023   Pharmacologic therapy 03/05/2023   Disorder of skeletal system 03/05/2023   Problems influencing health status 03/05/2023   Resolved Ambulatory Problems    Diagnosis Date Noted   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   Past Medical History:  Diagnosis Date   Chronic kidney disease    Migraines    Constitutional Exam  General appearance: Well nourished, well developed, and well hydrated. In no apparent acute distress There were no vitals filed for this visit. BMI Assessment: Estimated body mass index is 22.15 kg/m as calculated from the following:   Height as of 01/09/23: 5\' 9"  (1.753 m).   Weight as of 01/09/23: 150 lb (68 kg).  BMI interpretation table: BMI level Category Range association with higher incidence of chronic pain  <18 kg/m2 Underweight   18.5-24.9 kg/m2 Ideal body weight   25-29.9 kg/m2 Overweight Increased incidence by 20%  30-34.9 kg/m2 Obese (Class [place]) Increased incidence by 68%  35-39.9 kg/m2 Severe obesity (Class II) Increased incidence by 136%  >40 kg/m2 Extreme obesity (Class III) Increased incidence by 254%   Patient's  current BMI Ideal Body weight  There is no height or weight on file to calculate BMI. Patient weight not recorded   BMI Readings from Last 4 Encounters:  01/09/23 22.15 kg/m  09/21/22 22.59 kg/m  04/07/22 22.15 kg/m  03/16/22 21.71 kg/m   Wt Readings from Last 4 Encounters:  01/09/23 150 lb (68 kg)  09/21/22 153 lb (69.4 kg)  04/07/22 150 lb (68 kg)  03/16/22 147 lb (66.7 kg)    Psych/Mental status: Alert, oriented x 3 (person, place, & time)       Eyes: PERLA Respiratory: No evidence of acute respiratory distress  Assessment  Primary Diagnosis & Pertinent Problem List: The primary encounter diagnosis was Chronic pain syndrome. Diagnoses of Pharmacologic therapy, Disorder of skeletal system, and Problems influencing health  status were also pertinent to this visit.  Visit Diagnosis (New problems to examiner): 1. Chronic pain syndrome   2. Pharmacologic therapy   3. Disorder of skeletal system   4. Problems influencing health status    Plan of Care (Initial workup plan)  Note: Mr. Beckles was reminded that as per protocol, today's visit has been an evaluation only. We have not taken over the patient's controlled substance management.  Problem-specific plan: Assessment and Plan            Lab Orders  No laboratory test(s) ordered today   Imaging Orders  No imaging studies ordered today   Referral Orders  No referral(s) requested today   Procedure Orders    No procedure(s) ordered today   Pharmacotherapy (current): Medications ordered:  No orders of the defined types were placed in this encounter.  Medications administered during this visit: Clayton C. [place] had no medications administered during this visit.   Analgesic Pharmacotherapy:  Opioid Analgesics: For patients currently taking or requesting to take opioid analgesics, in accordance with [facility] Guidelines, we will assess their risks and indications for the use of these substances. After completing our evaluation, we may offer recommendations, but we no longer take patients for medication management. The prescribing physician will ultimately decide, based on his/her training and level of comfort whether to adopt any of the recommendations, including whether or not to prescribe such medicines.  Membrane stabilizer: To be determined at a later time  Muscle relaxant: To be determined at a later time  NSAID: To be determined at a later time  Other analgesic(s): To be determined at a later time   Interventional management options: Mr. Havlin was informed that there is no guarantee that he would be a candidate for interventional therapies. The decision will be based on the results of diagnostic studies, as well as Mr.  Otterson risk profile.  Procedure(s) under consideration:  Pending results of ordered studies      Interventional Therapies  Risk Factors  Considerations  Medical Comorbidities:     Planned  Pending:      Under consideration:   Pending   Completed:   None at this time   Therapeutic  Palliative (PRN) options:   None established   Completed by other providers:   Bilateral T6-7 transforaminal epidural injection Chase Jungling, MD - 12/15/2022) Left T6-7 transforaminal epidural injection Chase Jungling, MD - 10/07/2022)  Left ICNB ( [place]) (08/04/2021) by Raechel Chute.  Thoracic interlaminar ESI - TESI (Left) T8-T9 (06/17/2021) by Raechel Chute.  H/O bilateral shoulder surgery.      Provider-requested follow-up: No follow-ups on file.  Future Appointments  Date Time Provider Department Center  03/06/2023  9:00  AM Delano Metz, MD [facility] None  03/08/2023 10:30 AM Cira Rue, PT [facility] None  03/14/2023  3:15 PM Cira Rue, PT [facility] None  03/16/2023  3:15 PM Cira Rue, PT [facility] None  03/20/2023  2:30 PM Cira Rue, PT [facility] None  03/22/2023  2:30 PM Cira Rue, PT [facility] None  03/27/2023  3:15 PM Cira Rue, PT [facility] None  03/29/2023  3:15 PM Cira Rue, PT [facility] None  04/03/2023  3:15 PM Cira Rue, PT [facility] None  04/05/2023  3:15 PM Cira Rue, PT [facility] None  04/10/2023  3:15 PM Cira Rue, PT [facility] None  04/12/2023  3:15 PM Cira Rue, PT [facility] None    Duration of encounter: *** minutes.  Total time on encounter, as per AMA guidelines included both the face-to-face and non-face-to-face time personally spent by the physician and/or other qualified health care professional(s) on the day of the encounter (includes time in activities that require the physician or other qualified health care professional and does not include time in activities normally performed by clinical staff). Physician's  time may include the following activities when performed: Preparing to see the patient (e.g., pre-charting review of records, searching for previously ordered imaging, lab work, and nerve conduction tests) Review of prior analgesic pharmacotherapies. Reviewing PMP Interpreting ordered tests (e.g., lab work, imaging, nerve conduction tests) Performing post-procedure evaluations, including interpretation of diagnostic procedures Obtaining and/or reviewing separately obtained history Performing a medically appropriate examination and/or evaluation Counseling and educating the patient/family/caregiver Ordering medications, tests, or procedures Referring and communicating with other health care professionals (when not separately reported) Documenting clinical information in the electronic or other health record Independently interpreting results (not separately reported) and communicating results to the patient/ family/caregiver Care coordination (not separately reported)  Note by: Oswaldo Done, MD (AI and TTS technology used. [place] apologize for any typographical errors that were not detected and corrected.) Date: 03/06/2023; Time: 2:18 PM

## 2023-03-05 NOTE — Patient Instructions (Incomplete)
 ______________________________________________________________________    Procedure instructions  Stop blood-thinners  Do not eat or drink fluids (other than water) for 6 hours before your procedure  No water for 2 hours before your procedure  Take your blood pressure medicine with a sip of water  Arrive 30 minutes before your appointment  If sedation is planned, bring suitable driver. Pennie Banter, Benedetto Goad, & public transportation are NOT APPROVED)  Carefully read the "Preparing for your procedure" detailed instructions  If you have questions call us at [phone]  Procedure appointments are for procedures only. NO medication refills or new problem evaluations.   ______________________________________________________________________      ______________________________________________________________________    Preparing for your procedure  Appointments: If you think you may not be able to keep your appointment, call 24-48 hours in advance to cancel. We need time to make it available to others.  Procedure visits are for procedures only. During your procedure appointment there will be: NO Prescription Refills*. NO medication changes or discussions*. NO discussion of disability issues*. NO unrelated pain problem evaluations*. NO evaluations to order other pain procedures*. *These will be addressed at a separate and distinct evaluation encounter on the provider's evaluation schedule and not during procedure days.  Instructions: Food intake: Avoid eating anything solid for at least 8 hours prior to your procedure. Clear liquid intake: You may take clear liquids such as water up to 2 hours prior to your procedure. (No carbonated drinks. No soda.) Transportation: Unless otherwise stated by your physician, bring a driver. (Driver cannot be a Market researcher, Pharmacist, community, or any other form of public transportation.) Morning Medicines: Except for blood thinners, take all of your other morning  medications with a sip of water. Make sure to take your heart and blood pressure medicines. If your blood pressure's lower number is above 100, the case will be rescheduled. Blood thinners: Make sure to stop your blood thinners as instructed.  If you take a blood thinner, but were not instructed to stop it, call our office [phone] and ask to talk to a nurse. Not stopping a blood thinner prior to certain procedures could lead to serious complications. Diabetics on insulin: Notify the staff so that you can be scheduled 1st case in the morning. If your diabetes requires high dose insulin, take only  of your normal insulin dose the morning of the procedure and notify the staff that you have done so. Preventing infections: Shower with an antibacterial soap the morning of your procedure.  Build-up your immune system: Take 1000 mg of Vitamin C with every meal (3 times a day) the day prior to your procedure. Antibiotics: Inform the nursing staff if you are taking any antibiotics or if you have any conditions that may require antibiotics prior to procedures. (Example: recent joint implants)   Pregnancy: If you are pregnant make sure to notify the nursing staff. Not doing so may result in injury to the fetus, including death.  Sickness: If you have a cold, fever, or any active infections, call and cancel or reschedule your procedure. Receiving steroids while having an infection may result in complications. Arrival: You must be in the facility at least 30 minutes prior to your scheduled procedure. Tardiness: Your scheduled time is also the cutoff time. If you do not arrive at least 15 minutes prior to your procedure, you will be rescheduled.  Children: Do not bring any children with you. Make arrangements to keep them home. Dress appropriately: There is always a possibility that your clothing may get  soiled. Avoid long dresses. Valuables: Do not bring any jewelry or valuables.  Reasons to call and  reschedule or cancel your procedure: (Following these recommendations will minimize the risk of a serious complication.) Surgeries: Avoid having procedures within 2 weeks of any surgery. (Avoid for 2 weeks before or after any surgery). Flu Shots: Avoid having procedures within 2 weeks of a flu shots or . (Avoid for 2 weeks before or after immunizations). Barium: Avoid having a procedure within 7-10 days after having had a radiological study involving the use of radiological contrast. (Myelograms, Barium swallow or enema study). Heart attacks: Avoid any elective procedures or surgeries for the initial 6 months after a "Myocardial Infarction" (Heart Attack). Blood thinners: It is imperative that you stop these medications before procedures. Let us know if you if you take any blood thinner.  Infection: Avoid procedures during or within two weeks of an infection (including chest colds or gastrointestinal problems). Symptoms associated with infections include: Localized redness, fever, chills, night sweats or profuse sweating, burning sensation when voiding, cough, congestion, stuffiness, runny nose, sore throat, diarrhea, nausea, vomiting, cold or Flu symptoms, recent or current infections. It is specially important if the infection is over the area that we intend to treat. Heart and lung problems: Symptoms that may suggest an active cardiopulmonary problem include: cough, chest pain, breathing difficulties or shortness of breath, dizziness, ankle swelling, uncontrolled high or unusually low blood pressure, and/or palpitations. If you are experiencing any of these symptoms, cancel your procedure and contact your primary care physician for an evaluation.  Remember:  Regular Business hours are:  Monday to Thursday 8:00 AM to 4:00 PM  Provider's Schedule: Delano Metz, MD:  Procedure days: Tuesday and Thursday 7:30 AM to 4:00 PM  Edward Jolly, MD:  Procedure days: Monday and Wednesday 7:30 AM to 4:00  PM Last  Updated: 12/20/2022 ______________________________________________________________________      ______________________________________________________________________    General Risks and Possible Complications  Patient Responsibilities: It is important that you read this as it is part of your informed consent. It is our duty to inform you of the risks and possible complications associated with treatments offered to you. It is your responsibility as a patient to read this and to ask questions about anything that is not clear or that you believe was not covered in this document.  Patient's Rights: You have the right to refuse treatment. You also have the right to change your mind, even after initially having agreed to have the treatment done. However, under this last option, if you wait until the last second to change your mind, you may be charged for the materials used up to that point.  Introduction: Medicine is not an Visual merchandiser. Everything in Medicine, including the lack of treatment(s), carries the potential for danger, harm, or loss (which is by definition: Risk). In Medicine, a complication is a secondary problem, condition, or disease that can aggravate an already existing one. All treatments carry the risk of possible complications. The fact that a side effects or complications occurs, does not imply that the treatment was conducted incorrectly. It must be clearly understood that these can happen even when everything is done following the highest safety standards.  No treatment: You can choose not to proceed with the proposed treatment alternative. The "PRO(s)" would include: avoiding the risk of complications associated with the therapy. The "CON(s)" would include: not getting any of the treatment benefits. These benefits fall under one of three categories: diagnostic; therapeutic; and/or  palliative. Diagnostic benefits include: getting information which can ultimately lead to  improvement of the disease or symptom(s). Therapeutic benefits are those associated with the successful treatment of the disease. Finally, palliative benefits are those related to the decrease of the primary symptoms, without necessarily curing the condition (example: decreasing the pain from a flare-up of a chronic condition, such as incurable terminal cancer).  General Risks and Complications: These are associated to most interventional treatments. They can occur alone, or in combination. They fall under one of the following six (6) categories: no benefit or worsening of symptoms; bleeding; infection; nerve damage; allergic reactions; and/or death. No benefits or worsening of symptoms: In Medicine there are no guarantees, only probabilities. No healthcare provider can ever guarantee that a medical treatment will work, they can only state the probability that it may. Furthermore, there is always the possibility that the condition may worsen, either directly, or indirectly, as a consequence of the treatment. Bleeding: This is more common if the patient is taking a blood thinner, either prescription or over the counter (example: Goody Powders, Fish oil, Aspirin, Garlic, etc.), or if suffering a condition associated with impaired coagulation (example: Hemophilia, cirrhosis of the liver, low platelet counts, etc.). However, even if you do not have one on these, it can still happen. If you have any of these conditions, or take one of these drugs, make sure to notify your treating physician. Infection: This is more common in patients with a compromised immune system, either due to disease (example: diabetes, cancer, human immunodeficiency virus [HIV], etc.), or due to medications or treatments (example: therapies used to treat cancer and rheumatological diseases). However, even if you do not have one on these, it can still happen. If you have any of these conditions, or take one of these drugs, make sure to notify  your treating physician. Nerve Damage: This is more common when the treatment is an invasive one, but it can also happen with the use of medications, such as those used in the treatment of cancer. The damage can occur to small secondary nerves, or to large primary ones, such as those in the spinal cord and brain. This damage may be temporary or permanent and it may lead to impairments that can range from temporary numbness to permanent paralysis and/or brain death. Allergic Reactions: Any time a substance or material comes in contact with our body, there is the possibility of an allergic reaction. These can range from a mild skin rash (contact dermatitis) to a severe systemic reaction (anaphylactic reaction), which can result in death. Death: In general, any medical intervention can result in death, most of the time due to an unforeseen complication. ______________________________________________________________________    ______________________________________________________________________    OTC Supplements:   The following is a list of over-the-counter (OTC) supplements that have been found to have [employer]) studies suggesting that they may be of some benefits when used in moderation in some chronic pain-related conditions.  NOTE:  Always consult with your primary care provider and/or pharmacist before taking any OTC medications to make sure they will not interact with your current medications. Always use manufacturer's recommended dosage.  Supplement Possible benefit May be of benefit in treatment of   Turmeric/curcumin anti-inflammatory Joint and muscle aches and pain.  Glucosamine/chondroitin (triple strength) may slow loss of articular cartilage Joint pain.  Vitamin D-3* may suppress release of chemicals associated with inflammation. Increases tolerance to pain. Joint and muscle aches and pain.   Moringa(+) anti-inflammatory  with mild analgesic effects Joint and  muscle aches and pain.  Melatonin(+) Helps reset sleep cycle. Insomnia.  Vitamin B-12* may help keep nerves and blood cells healthy as well as maintaining function of nervous system Nerve pain (Burning pain)  Alpha-Lipoic-Acid (ALA)* antioxidant that may help with nerve health, pain, and blocking the activation of some inflammatory chemicals Diabetic neuropathy and metabolic syndrome  superoxide dismutase (SOD)** Currently being reviewed.   Tiger Balm Currently being reviewed.   hydrolyzed collagen peptides* Currently being reviewed.  Collagen supplementation may increases bone strength, density, and mass; may improve joint stiffness/mobility, and functionality; and may reduce joint pain. Possible chondroprotective effects. May help with protection of joint health.   Methylsulfonylmethane (MSM)* Currently being reviewed.   CBD(+) Currently being reviewed.   Delta-8 THC(+) Currently being reviewed.   *  Generally Recognized As Safe (GRAS) approved substance.-FDA ([employer]) ** "Possibly Safe", but not considered Generally Recognized As Safe (Not GRAS) by the [place] (FDA) as a food additive. (+) Not considered Generally Recognized As Safe (Not GRAS) by the [employer] and Geophysicist/field seismologist (FDA) as a food additive.  ______________________________________________________________________      ______________________________________________________________________    New Patients  Welcome to [facility].   Initial Visit The first or initial visit consists of an evaluation only.   Interventional pain management.  We offer therapies other than opioid controlled substances to manage chronic pain. These include, but are not limited to, diagnostic, therapeutic, and palliative specialized injection therapies (i.e.: Epidural  Steroids, Facet Blocks, etc.). We specialize in a variety of nerve blocks as well as radiofrequency treatments. We offer pain implant evaluations and trials, as well as follow up management. In addition we also provide a variety joint injections, including Viscosupplementation (AKA: Gel Therapy).  Prescription Pain Medication. We specialize in alternatives to opioids. We can provide evaluations and recommendations for/of pharmacologic therapies based on CDC Guidelines.  We no longer take patients for long-term medication management. We will not be taking over your pain medications.  ______________________________________________________________________      ______________________________________________________________________    Patient Information update  To: All of our patients.  Re: Name change.  It has been made official that our current name, "[facility]"   will soon be changed to "[facility]".   The purpose of this change is to eliminate any confusion created by the concept of our practice being a "Medication Management Pain Clinic". In the past this has led to the misconception that we treat pain primarily by the use of prescription medications.  Nothing can be farther from the truth.   Understanding PAIN MANAGEMENT: To further understand what our practice does, you first have to understand that "Pain Management" is a subspecialty that requires additional training once a physician has completed their specialty training, which can be in either Anesthesia, Neurology, Psychiatry, or Physical Medicine and Rehabilitation (PMR). Each one of these contributes to the final approach taken by each physician to the management of their patient's pain. To be a "Pain Management Specialist" you must have first completed one of the specialty trainings below.  Anesthesiologists - trained in  clinical pharmacology and interventional techniques such as nerve blockade and regional as well as central neuroanatomy. They are trained to block pain before, during, and after surgical interventions.  Neurologists - trained in the diagnosis and pharmacological treatment of complex neurological  conditions, such as Multiple Sclerosis, Parkinson's, spinal cord injuries, and other systemic conditions that may be associated with symptoms that may include but are not limited to pain. They tend to rely primarily on the treatment of chronic pain using prescription medications.  Psychiatrist - trained in conditions affecting the psychosocial wellbeing of patients including but not limited to depression, anxiety, schizophrenia, personality disorders, addiction, and other substance use disorders that may be associated with chronic pain. They tend to rely primarily on the treatment of chronic pain using prescription medications.   Physical Medicine and Rehabilitation (PMR) physicians, also known as physiatrists - trained to treat a wide variety of medical conditions affecting the brain, spinal cord, nerves, bones, joints, ligaments, muscles, and tendons. Their training is primarily aimed at treating patients that have suffered injuries that have caused severe physical impairment. Their training is primarily aimed at the physical therapy and rehabilitation of those patients. They may also work alongside orthopedic surgeons or neurosurgeons using their expertise in assisting surgical patients to recover after their surgeries.  INTERVENTIONAL PAIN MANAGEMENT is sub-subspecialty of Pain Management.  Our physicians are Board-certified in Anesthesia, Pain Management, and Interventional Pain Management.  This meaning that not only have they been trained and Board-certified in their specialty of Anesthesia, and subspecialty of Pain Management, but they have also received further training in the sub-subspecialty of  Interventional Pain Management, in order to become Board-certified as INTERVENTIONAL PAIN MANAGEMENT SPECIALIST.    Mission: Our goal is to use our skills in  INTERVENTIONAL PAIN MANAGEMENT as alternatives to the chronic use of prescription opioid medications for the treatment of pain. To make this more clear, we have changed our name to reflect what we do and offer. We will continue to offer medication management assessment and recommendations, but we will not be taking over any patient's medication management.  ______________________________________________________________________

## 2023-03-06 ENCOUNTER — Ambulatory Visit: Payer: 59 | Attending: Pain Medicine | Admitting: Pain Medicine

## 2023-03-06 ENCOUNTER — Encounter: Payer: Self-pay | Admitting: Pain Medicine

## 2023-03-06 VITALS — BP 117/72 | HR 58 | Temp 97.6°F | Resp 16 | Ht 69.0 in | Wt 150.0 lb

## 2023-03-06 DIAGNOSIS — Z79899 Other long term (current) drug therapy: Secondary | ICD-10-CM

## 2023-03-06 DIAGNOSIS — G8929 Other chronic pain: Secondary | ICD-10-CM | POA: Diagnosis not present

## 2023-03-06 DIAGNOSIS — G894 Chronic pain syndrome: Secondary | ICD-10-CM | POA: Diagnosis not present

## 2023-03-06 DIAGNOSIS — Z789 Other specified health status: Secondary | ICD-10-CM | POA: Diagnosis not present

## 2023-03-06 DIAGNOSIS — M5134 Other intervertebral disc degeneration, thoracic region: Secondary | ICD-10-CM | POA: Insufficient documentation

## 2023-03-06 DIAGNOSIS — M899 Disorder of bone, unspecified: Secondary | ICD-10-CM | POA: Insufficient documentation

## 2023-03-06 DIAGNOSIS — R937 Abnormal findings on diagnostic imaging of other parts of musculoskeletal system: Secondary | ICD-10-CM | POA: Diagnosis not present

## 2023-03-06 DIAGNOSIS — M546 Pain in thoracic spine: Secondary | ICD-10-CM | POA: Diagnosis not present

## 2023-03-06 NOTE — Progress Notes (Signed)
 Safety precautions to be maintained throughout the outpatient stay will include: orient to surroundings, keep bed in low position, maintain call bell within reach at all times, provide assistance with transfer out of bed and ambulation.

## 2023-03-08 ENCOUNTER — Ambulatory Visit: Payer: 59 | Admitting: Physical Therapy

## 2023-03-08 VITALS — BP 110/78 | HR 64

## 2023-03-08 DIAGNOSIS — M546 Pain in thoracic spine: Secondary | ICD-10-CM

## 2023-03-08 DIAGNOSIS — M6281 Muscle weakness (generalized): Secondary | ICD-10-CM

## 2023-03-08 DIAGNOSIS — M542 Cervicalgia: Secondary | ICD-10-CM

## 2023-03-08 DIAGNOSIS — M792 Neuralgia and neuritis, unspecified: Secondary | ICD-10-CM

## 2023-03-08 NOTE — Therapy (Signed)
 OUTPATIENT PHYSICAL THERAPY TREATMENT   Patient Name: Chase Stewart MRN: [id number] DOB:1985-06-10, 38 y.o., male Today's Date: 03/08/2023  END OF SESSION:  PT End of Session - 03/08/23 1132     Visit Number 2    Number of Visits 17    Date for PT Re-Evaluation 05/17/23    Authorization Type [employer] 2025 - reporting from 02/22/23    Authorization Time Period 30 per yr    Authorization - Visit Number 2    Authorization - Number of Visits 30    Progress Note Due on Visit 10    PT Start Time 1030    PT Stop Time 1115    PT Time Calculation (min) 45 min    Activity Tolerance Patient tolerated treatment well    Behavior During Therapy [facility] for tasks assessed/performed              Past Medical History:  Diagnosis Date   Anxiety    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   Chronic kidney disease    h/o kidney stones   Depression    Migraines    Past Surgical History:  Procedure Laterality Date   ROTATOR CUFF REPAIR Right    SHOULDER ARTHROSCOPY WITH OPEN ROTATOR CUFF REPAIR AND DISTAL CLAVICLE ACROMINECTOMY Left 05/26/2015   Procedure: SHOULDER ARTHROSCOPY WITH ANTERIOR AND SUPERIOR LABRAL REPAIR;  Surgeon: Juanell Fairly, MD;  Location: [facility] ORS;  Service: Orthopedics;  Laterality: Left;   TONSILLECTOMY     TYMPANOSTOMY TUBE PLACEMENT     Patient Active Problem List   Diagnosis Date Noted   Abnormal MRI, cervical spine (01/16/2023) 03/06/2023   Abnormal MRI, thoracic spine (11/28/2022) 03/06/2023   Thoracic facet joint pain (Left) 03/06/2023   Chronic thoracic back pain (1ry area of Pain) (Left) 03/06/2023   Chronic pain syndrome 03/05/2023   Pharmacologic therapy 03/05/2023   Disorder of skeletal system 03/05/2023   Problems influencing health status 03/05/2023   Depression 09/21/2022   DDD (degenerative disc disease), thoracic 06/17/2021   Anxiety 06/17/2021   Acute back pain 04/02/2020   Rib pain 04/02/2020   History of herpes labialis 12/28/2015   Shoulder pain 08/28/2015     PCP: No PCP  REFERRING PROVIDER: Elijah Birk, MD  REFERRING DIAG: Cervicalgia, Cervical radiculitis   Rationale for Evaluation and Treatment: Rehabilitation  THERAPY DIAG:  Cervicalgia  Pain in thoracic spine  Muscle weakness (generalized)  Neuralgia and neuritis  ONSET DATE: Thoracic spine pain about 3 years ago, Cervical spine pain about 6 months ago  SUBJECTIVE:  PERTINENT HISTORY:  Patient is a 38 y.o. male who presents to outpatient physical therapy with a referral for medical diagnosis cervicalgia and cervical radiculitis. This patient's chief complaints consist of chronic, constant, and aching thoracic spine pain that has progressed up to his neck and bilateral shoulders, leading to the following functional deficits: difficulty with sleeping, twisting, lifting, driving, working, and playing with his children. Relevant past medical history and comorbidities include anxiety/depression, bradycardia, and migraines.    SUBJECTIVE STATEMENT: Patient reports he is feeling a little bit achy today in his usual spot in the left thoracic spine. He felt okay after last PT session. He also has some pain at his left base of his neck when he moves it to the side. He saw Dr. Laban Emperor at [facility]. He also thought the pain in his thoracic psine was coming from the costotransverse joint and plans a diagnostic shot to see if an ablation there would be useful. Patient is waiting for insurance approval before scheduling this.    PAIN:  Are you having pain? Yes NPRS: Current: 3/10 in neck, 5/10 left thoracic spine  Best: 2/10 for all pain spots,  Worst: 8/10 for all pain locations Pain location: suboccipitals/upper cervical spine L > R, left  side of neck, upper/mid back on left, bilateral shoulders L > R, occasionally elbows (L > R) Pain description: achy, soreness, stagnant, constant, not shooting  Aggravating factors: Sleeping, twisting (feels like it "grinds" and "gets stuck"), working out (only feels worse after workout), prolonged sitting Relieving factors: wearing bullet proof vest, wearing posture corrector that compresses his ribs, moving during a workout, warm showers, Ibuprofen (helps more than other pain meds), intercostal rib injection (helped temporarily)     PRECAUTIONS: None  RED FLAGS: Patient denies hx of cancer, stroke, seizures, lung problems, heart problems, diabetes, unexplained weight loss, unexplained changes in bowel or bladder problems, unexplained stumbling or dropping things, osteoporosis, and spinal surgery    PLOF: Independent  PATIENT GOALS: strengthening muscles, help with stability, relieve some pressure on joints   NEXT MD VISIT:   OBJECTIVE:   SELF-REPORTED FUNCTION Patient Specific Functional Scale (PSFS)  Playing with kids: 5 More involved workouts: 5 Outings with family: 5 Average: 5  Vitals:   03/08/23 1043  BP: 110/78  Pulse: 64  SpO2: 98%    TREATMENT:                                                                                                                              Therapeutic exercise: therapeutic exercises that incorporate ONE parameter at one or more areas of the body to centralize symptoms, develop strength and endurance, range of motion, and flexibility required for successful completion of functional activities.  Vitals check for systems review and prior to exercise for safety (see above)  Asterisk sign: left cervical rotation+sidebending+extension Repeated throughout session after interventions to determine success of intervention.  Seated cervical retraction with towel roll behind the low back 1x10 AROM 2x10 with self overpressure (With manual  overpressure - see below) 1x10 with extension (no improvement)  Prone cervical retraction with self overpressure 1x10  2x10 With extension+self overpressure Minimal improvement in asterisk sign (still better than at start of visit, but "catch" when moving into more extension after already being in left rotation/SB)  Education on HEP including handout   Manual therapy: to reduce pain and tissue tension, improve range of motion, neuromodulation, in order to promote improved ability to complete functional activities.  Hooklying:  Left closing mob at lower cervical spine (just caudal to most painful segment),  1x30 seconds with grade II-IV Painful, no improvement in asterisk sign Left closing MET at lower cervical spine (just caudal to most painful segment),  5x4 second hold Uncomfortable, no significant change in asterisk sign following.  Seated with towel roll behind the low back Cervical retraction with clinician overpressure 1x10 with light overpressure 2x10 with stronger overpressure Improved asterisk sign after set 1 and 2 but no further improvement after 3rd set.   Pt required multimodal cuing for proper technique and to facilitate improved neuromuscular control, strength, range of motion, and functional ability resulting in improved performance and form.   PATIENT EDUCATION:  Education details: Exercise purpose/form. Self management techniques.  Education on HEP including handout. Person educated: Patient Education method: Medical illustrator Education comprehension: verbalized understanding, returned demonstration, and needs further education  HOME EXERCISE PROGRAM: Access Code: [email or web address] URL: [email or web address] Date: 03/08/2023 Prepared by: Norton Blizzard  Exercises - Gentle Levator Scapulae Stretch  - 1 x daily - 2 sets - 30 seconds hold - Seated Posture with Lumbar Roll   HOME EXERCISE PROGRAM [[email or web address]] View at [email or web address]-[email or web address]  using code: UKPKVEM Choose one or the other:  Cervical Retraction in Sitting w/OP - MDT -  Repeat 20 Repetitions, Hold 2 Seconds, Complete 1 Set, Perform 8 Times a Day  Retraction in Prone with Overpressure- McKenzie -  Repeat 20 Repetitions, Hold 2 Seconds, Perform 8 Times a Day  ASSESSMENT:  CLINICAL IMPRESSION: Patient arrives with continued left sided neck pain and left thoracic pain. Today's session focused on addressing neck pain. Closing mobilization and MET for the L cervical spine was uncomfortable and produced no change in asterisk sign. Repeated cervical extension with clinician overpressure improved asterisk sign slightly and patient was able to tolerate progressions in cervical retraction to include prone retraction+extension with self overpressure. Although patient could tolerate retraction+extension in seated and prone, he found it uncomfortable in prone and per observation did not appear to obtain full retraction before going into extension. Therefore, retraction with self overpressure was prescribed in HEP at a frequency of 20 repetitions every 2 hours. Prone position preferred due to the ability to generate more force, but seated position to be used when prone is not available throughout the day. Patient had overall improvement in asterisk sign, but continued to have a "catch" when moving into extension from a left rotated and side bent position. Patient did not get further improvement with self overpressure techniques in the clinic but did maintain most of the improvement that was achieved with manual overpressure in seated. Plan to assess for response to repeated motions, update HEP as appropriate, and consider testing thoracic pain response to repeated motions at next PT session. Patient would benefit from continued management of limiting condition by skilled physical therapist to address remaining impairments and functional limitations to work towards stated goals and return to  PLOF or  maximal functional independence.   From Initial PT Evaluation on 02/22/2023:  Patient is a 38 y.o. male referred to outpatient physical therapy with a medical diagnosis of cervicalgia and cervical radiculitis who presents with signs and symptoms consistent with cervical spine and thoracic spine pain with mobility deficits, cervical and thoracic spine pain with strength deficits, and cervical and thoracic pain with radiating symptoms to B shoulders/upper extremities (L > R) . Patient presents with cervical/thoracic range of motion, periscapular strength, and activity tolerance impairments that are limiting ability to complete tasks such as twisting, lifting, driving, working, and playing with his children without difficulty. Patient had equal and intact sensation to the upper extremities, had pain local to the left side of his neck with extension+left lateral flexion+left rotation during Spurling's that did not peripheralize to the left UE, and experienced no change in symptoms with cervical distraction. These findings lower the likelihood of cervical radiculopathy contributing to the patient's chief concern. Patient will benefit from skilled physical therapy intervention to address current body structure impairments and activity limitations to improve function and work towards goals set in current POC in order to return to prior level of function or maximal functional improvement.    OBJECTIVE IMPAIRMENTS: decreased activity tolerance, decreased endurance, decreased knowledge of condition, decreased mobility, decreased ROM, decreased strength, hypomobility, impaired sensation, and pain.   ACTIVITY LIMITATIONS: carrying, lifting, sitting, sleeping, and caring for others  PARTICIPATION LIMITATIONS: meal prep, cleaning, laundry, interpersonal relationship, driving, community activity, and occupation  PERSONAL FACTORS: Past/current experiences, Time since onset of injury/illness/exacerbation, and 3+  comorbidities: anxiety/depression, migraines, bradycardia  are also affecting patient's functional outcome.   REHAB POTENTIAL: Fair chronicity of condition, unclear diagnosis/cause of current pain   CLINICAL DECISION MAKING: Evolving/moderate complexity  EVALUATION COMPLEXITY: Moderate   GOALS: Goals reviewed with patient? No  SHORT TERM GOALS: Target date: 03/08/2023  Patient will be independent with initial home exercise program for self-management of symptoms. Baseline: Initial HEP provided at IE (02/22/23); Goal status: In progress  Patient will demonstrate improvement in Patient Specific Functional Scale (PSFS) of equal or greater than 3 points to reflect clinically significant improvement in patient's most valued functional activities.. Baseline: to be measured at visit 2 as appropriate (02/22/23); 5/10 at visit #2 (03/08/2023) Goal status: In Progress  3. Patient will demonstrate improvement in Neck Disability Index (NDI) by equal or greater than 10 percentage points (MCID) to reflect clinically significant improvement in overall condition and self-reported functional ability. Baseline: 22% (02/22/23); Goal status: In progress  4. Patient will report improvement in NPRS of equal or greater than 2 points during functional activities to improve their abilitly to complete community, work and/or recreational activities with less limitation. Baseline: up to 8/10 with activity (02/22/23); Goal status: In progress   LONG TERM GOALS: Target date: 05/17/2023  Patient will be independent with a long-term home exercise program for self-management of symptoms.  Baseline: Initial HEP provided at IE (02/22/23); Goal status: In progress  2.  Patient will demonstrate improved Neck Disability Index (NDI) to equal or less than 10% to demonstrate improvement in overall condition and self-reported functional ability.  Baseline: 22% (02/22/23); Goal status: In progress  3.  Patient will  demonstrate a 10% increase in weight lifted from floor to waist to be able to complete lifting activities with less difficulty.   Baseline: to be measured at visit 2 as appropriate (02/22/23); Goal status: In progress  4.  Patient will demonstrate improvement in lower trapezius  strength to equal to or greater than 4/5 bilaterally for improved postural strength/endurance to make prolonged sitting/standing less difficult.  Baseline: 3/5 bilaterally (02/22/23); Goal status: In progress  5.  Patient will demonstrate improvement in Patient Specific Functional Scale (PSFS) of equal or greater than 8/10 points to reflect clinically significant improvement in patient's most valued functional activities. Baseline: to be measured at visit 2 as appropriate (02/22/23); 5/10 at visit #2 (03/08/2023) Goal status: In Progress  6.  Patient will report NPRS equal or less than 3/10 during functional activities during the last 2 weeks to improve their abilitly to complete community, work and/or recreational activities with less limitation. Baseline: up to 8/10 with activity (02/22/23); Goal status: In progress   PLAN:  PT FREQUENCY: 1-2x/week  PT DURATION: 8-12 weeks  PLANNED INTERVENTIONS: 97164- PT Re-evaluation, 97110-Therapeutic exercises, 97530- Therapeutic activity, 97112- Neuromuscular re-education, 97535- Self Care, 16109- Manual therapy, 97014- Electrical stimulation (unattended), [phone]- Electrical stimulation (manual), Patient/Family education, Dry Needling, Joint mobilization, Joint manipulation, Spinal manipulation, Spinal mobilization, Cryotherapy, and Moist heat.  PLAN FOR NEXT SESSION: Floor to waist lifting assessment. Update HEP as needed. Repeated motions testing. Periscapular strengthening. Cervical and thoracic range of motion.    Luretha Murphy. Ilsa Iha, PT, DPT 03/08/23, 11:42 AM  [facility] [address] [place], [place] [address] P: [phone] I F:  [phone]

## 2023-03-13 ENCOUNTER — Telehealth: Payer: Self-pay

## 2023-03-13 NOTE — Telephone Encounter (Signed)
 Per Dr. Laban Emperor the levels will be T6-7 and T7-8.

## 2023-03-13 NOTE — Telephone Encounter (Signed)
 His insurance denied auth for the Thoracic facets because they only want no more than 2 levels ordered. Please advise on which 2 levels you will do, and I will appeal.

## 2023-03-14 ENCOUNTER — Encounter: Payer: Self-pay | Admitting: Physical Therapy

## 2023-03-14 ENCOUNTER — Ambulatory Visit: Payer: 59 | Attending: Physical Medicine & Rehabilitation | Admitting: Physical Therapy

## 2023-03-14 DIAGNOSIS — M6281 Muscle weakness (generalized): Secondary | ICD-10-CM | POA: Insufficient documentation

## 2023-03-14 DIAGNOSIS — M546 Pain in thoracic spine: Secondary | ICD-10-CM | POA: Insufficient documentation

## 2023-03-14 DIAGNOSIS — M792 Neuralgia and neuritis, unspecified: Secondary | ICD-10-CM | POA: Insufficient documentation

## 2023-03-14 DIAGNOSIS — M542 Cervicalgia: Secondary | ICD-10-CM | POA: Insufficient documentation

## 2023-03-14 NOTE — Therapy (Unsigned)
 OUTPATIENT PHYSICAL THERAPY TREATMENT   Patient Name: Chase Stewart MRN: [id number] DOB:09-Feb-1985, 38 y.o., male Today's Date: 03/14/2023  END OF SESSION:  PT End of Session - 03/14/23 1611     Visit Number 3    Number of Visits 17    Date for PT Re-Evaluation 05/17/23    Authorization Type [employer] 2025 - reporting from 02/22/23    Authorization Time Period 30 per yr    Authorization - Visit Number 3    Authorization - Number of Visits 30    Progress Note Due on Visit 10    PT Start Time 1523    PT Stop Time 1606    PT Time Calculation (min) 43 min    Activity Tolerance Patient tolerated treatment well    Behavior During Therapy WFL for tasks assessed/performed               Past Medical History:  Diagnosis Date   Anxiety    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   Chronic kidney disease    h/o kidney stones   Depression    Migraines    Past Surgical History:  Procedure Laterality Date   ROTATOR CUFF REPAIR Right    SHOULDER ARTHROSCOPY WITH OPEN ROTATOR CUFF REPAIR AND DISTAL CLAVICLE ACROMINECTOMY Left 05/26/2015   Procedure: SHOULDER ARTHROSCOPY WITH ANTERIOR AND SUPERIOR LABRAL REPAIR;  Surgeon: Juanell Fairly, MD;  Location: [facility];  Service: Orthopedics;  Laterality: Left;   TONSILLECTOMY     TYMPANOSTOMY TUBE PLACEMENT     Patient Active Problem List   Diagnosis Date Noted   Abnormal MRI, cervical spine (01/16/2023) 03/06/2023   Abnormal MRI, thoracic spine (11/28/2022) 03/06/2023   Thoracic facet joint pain (Left) 03/06/2023   Chronic thoracic back pain (1ry area of Pain) (Left) 03/06/2023   Chronic pain syndrome 03/05/2023   Pharmacologic therapy 03/05/2023   Disorder of skeletal system 03/05/2023   Problems influencing health status 03/05/2023   Depression 09/21/2022   DDD (degenerative disc disease), thoracic 06/17/2021   Anxiety 06/17/2021   Acute back pain 04/02/2020   Rib pain 04/02/2020   History of herpes labialis 12/28/2015   Shoulder pain 08/28/2015     PCP: No PCP  REFERRING PROVIDER: Elijah Birk, MD  REFERRING DIAG: Cervicalgia, Cervical radiculitis   Rationale for Evaluation and Treatment: Rehabilitation  THERAPY DIAG:  Cervicalgia  Pain in thoracic spine  Muscle weakness (generalized)  Neuralgia and neuritis  ONSET DATE: Thoracic spine pain about 3 years ago, Cervical spine pain about 6 months ago  SUBJECTIVE:  PERTINENT HISTORY:  Patient is a 38 y.o. male who presents to outpatient physical therapy with a referral for medical diagnosis cervicalgia and cervical radiculitis. This patient's chief complaints consist of chronic, constant, and aching thoracic spine pain that has progressed up to his neck and bilateral shoulders, leading to the following functional deficits: difficulty with sleeping, twisting, lifting, driving, working, and playing with his children. Relevant past medical history and comorbidities include anxiety/depression, bradycardia, and migraines.    SUBJECTIVE STATEMENT: Patient states his neck feels about the same. He has no pain at rest and 7/10 pain with the asterisk sign. His thoracic pain is pretty bad today. He did his HEP pretty faithfully every 2 hours.    PAIN:  Are you having pain? Yes NPRS: Current: 0/10 at rest 6-7/10 when performing asterisk sign in neck, 6/10 left thoracic spine  PRECAUTIONS: None  RED FLAGS: Patient denies hx of cancer, stroke, seizures, lung problems, heart problems, diabetes, unexplained weight loss, unexplained changes in bowel or bladder problems, unexplained stumbling or dropping things, osteoporosis, and spinal surgery    PLOF: Independent  PATIENT GOALS: strengthening muscles, help with stability, relieve some pressure on joints   NEXT MD VISIT: left thoracic spine injection  03/23/2023.   OBJECTIVE:  TREATMENT:                                                                                                                              Therapeutic exercise: therapeutic exercises that incorporate ONE parameter at one or more areas of the body to centralize symptoms, develop strength and endurance, range of motion, and flexibility required for successful completion of functional activities.  Asterisk sign: left cervical rotation+sidebending+extension Repeated throughout session after interventions to determine success of intervention.   Seated cervical retraction with towel roll behind the low back 1x10 with self overpressure 1x10 with extension  Cuing to keep chin lower  Prone cervical retraction AROM  1x10 with 10 second holds  Bent over single arm "Y"  1x12 each side with 7#DB (max) 1x15 each side with 5#DB Difficult Added to HEP with handout  Bent over single arm "T" 2x13 each side with 5#DB Difficult Added to HEP with handout  Education on HEP including handout   Manual therapy: to reduce pain and tissue tension, improve range of motion, neuromodulation, in order to promote improved ability to complete functional activities.  Seated with towel roll behind the low back Cervical retraction with clinician overpressure 4x6 with light overpressure Minimally improved asterisk sign after set 1 only   PRONE CPA grade III C2-C7 L UPA grade III C2-C6 CPA at approx C3, grade III-IV, 3x30 seconds L UPA approx C3, grade III-IV, 2x30 seconds R UPA approx C3, grade III-IV, 1x30 seconds  SUPINE  L Upper cervical spine distraction mobilization, grade III-IV, 1x30 seconds Cervical spine distraction with suboccipital release, 3x30 second holds L and right opening mobilization, grade II-IV, 1-2x30 seconds each side targeting mid/upper cervical spine Repeated  cervical retraction with clinician overpressure, 2x6 (distraction applied) STM to posterior cervical  spine musculature including suboccipitals, cervical paraspinals, SCM.  Educated on dry needling (pt wanted to defer potential dry needling to future).    Pt required multimodal cuing for proper technique and to facilitate improved neuromuscular control, strength, range of motion, and functional ability resulting in improved performance and form.   PATIENT EDUCATION:  Education details: Exercise purpose/form. Self management techniques.  Education on HEP including handout. Person educated: Patient Education method: Medical illustrator Education comprehension: verbalized understanding, returned demonstration, and needs further education  HOME EXERCISE PROGRAM: Access Code: [email or web address] URL: [email or web address] Date: 03/15/2023 Prepared by: Norton Blizzard  Exercises - Gentle Levator Scapulae Stretch  - 1 x daily - 2 sets - 30 seconds hold - Seated Posture with Lumbar Roll  - Prone Cervical Retraction  - 3 x weekly - 2 reps - 10 seconds hold - Single Arm Bent Over Shoulder Horizontal Abduction with Dumbbell - Palm Down  - 3 x weekly - 2-3 sets - 15 reps - Single Arm Bent Over Shoulder Scaption with Dumbbell  - 3 x weekly - 2-3 sets - 15 reps-   ASSESSMENT:  CLINICAL IMPRESSION: Patient arrives with continued left sided neck pain and left thoracic pain. He did need some cuing to perform cervical retraction and extension with better form, but overall does not appear to be responding to repeated motions exercises so this was discontinued. Other manual techniques were attempted today but ultimately nothing changed his L neck asterisk sign. Interventions were re-directed towards postural and periscapular strengthening. Patient found periscapular strengthening very challenging due to quick fatigue, so it was added to HEP to help address this impairment. Plan to continue with strengthening next session as well as possible trial of dry needling (per pt preference). Patient would  benefit from continued management of limiting condition by skilled physical therapist to address remaining impairments and functional limitations to work towards stated goals and return to PLOF or maximal functional independence.   From Initial PT Evaluation on 02/22/2023:  Patient is a 38 y.o. male referred to outpatient physical therapy with a medical diagnosis of cervicalgia and cervical radiculitis who presents with signs and symptoms consistent with cervical spine and thoracic spine pain with mobility deficits, cervical and thoracic spine pain with strength deficits, and cervical and thoracic pain with radiating symptoms to B shoulders/upper extremities (L > R) . Patient presents with cervical/thoracic range of motion, periscapular strength, and activity tolerance impairments that are limiting ability to complete tasks such as twisting, lifting, driving, working, and playing with his children without difficulty. Patient had equal and intact sensation to the upper extremities, had pain local to the left side of his neck with extension+left lateral flexion+left rotation during Spurling's that did not peripheralize to the left UE, and experienced no change in symptoms with cervical distraction. These findings lower the likelihood of cervical radiculopathy contributing to the patient's chief concern. Patient will benefit from skilled physical therapy intervention to address current body structure impairments and activity limitations to improve function and work towards goals set in current POC in order to return to prior level of function or maximal functional improvement.    OBJECTIVE IMPAIRMENTS: decreased activity tolerance, decreased endurance, decreased knowledge of condition, decreased mobility, decreased ROM, decreased strength, hypomobility, impaired sensation, and pain.   ACTIVITY LIMITATIONS: carrying, lifting, sitting, sleeping, and caring for others  PARTICIPATION LIMITATIONS: meal prep,  cleaning, laundry, interpersonal relationship, driving, community activity, and occupation  PERSONAL  FACTORS: Past/current experiences, Time since onset of injury/illness/exacerbation, and 3+ comorbidities: anxiety/depression, migraines, bradycardia  are also affecting patient's functional outcome.   REHAB POTENTIAL: Fair chronicity of condition, unclear diagnosis/cause of current pain   CLINICAL DECISION MAKING: Evolving/moderate complexity  EVALUATION COMPLEXITY: Moderate   GOALS: Goals reviewed with patient? No  SHORT TERM GOALS: Target date: 03/08/2023  Patient will be independent with initial home exercise program for self-management of symptoms. Baseline: Initial HEP provided at IE (02/22/23); Goal status: In progress  Patient will demonstrate improvement in Patient Specific Functional Scale (PSFS) of equal or greater than 3 points to reflect clinically significant improvement in patient's most valued functional activities.. Baseline: to be measured at visit 2 as appropriate (02/22/23); 5/10 at visit #2 (03/08/2023) Goal status: In Progress  3. Patient will demonstrate improvement in Neck Disability Index (NDI) by equal or greater than 10 percentage points (MCID) to reflect clinically significant improvement in overall condition and self-reported functional ability. Baseline: 22% (02/22/23); Goal status: In progress  4. Patient will report improvement in NPRS of equal or greater than 2 points during functional activities to improve their abilitly to complete community, work and/or recreational activities with less limitation. Baseline: up to 8/10 with activity (02/22/23); Goal status: In progress   LONG TERM GOALS: Target date: 05/17/2023  Patient will be independent with a long-term home exercise program for self-management of symptoms.  Baseline: Initial HEP provided at IE (02/22/23); Goal status: In progress  2.  Patient will demonstrate improved Neck Disability Index  (NDI) to equal or less than 10% to demonstrate improvement in overall condition and self-reported functional ability.  Baseline: 22% (02/22/23); Goal status: In progress  3.  Patient will demonstrate a 10% increase in weight lifted from floor to waist to be able to complete lifting activities with less difficulty.   Baseline: to be measured at visit 2 as appropriate (02/22/23); Goal status: In progress  4.  Patient will demonstrate improvement in lower trapezius strength to equal to or greater than 4/5 bilaterally for improved postural strength/endurance to make prolonged sitting/standing less difficult.  Baseline: 3/5 bilaterally (02/22/23); Goal status: In progress  5.  Patient will demonstrate improvement in Patient Specific Functional Scale (PSFS) of equal or greater than 8/10 points to reflect clinically significant improvement in patient's most valued functional activities. Baseline: to be measured at visit 2 as appropriate (02/22/23); 5/10 at visit #2 (03/08/2023) Goal status: In Progress  6.  Patient will report NPRS equal or less than 3/10 during functional activities during the last 2 weeks to improve their abilitly to complete community, work and/or recreational activities with less limitation. Baseline: up to 8/10 with activity (02/22/23); Goal status: In progress   PLAN:  PT FREQUENCY: 1-2x/week  PT DURATION: 8-12 weeks  PLANNED INTERVENTIONS: 97164- PT Re-evaluation, 97110-Therapeutic exercises, 97530- Therapeutic activity, 97112- Neuromuscular re-education, 97535- Self Care, 16109- Manual therapy, 97014- Electrical stimulation (unattended), [phone]- Electrical stimulation (manual), Patient/Family education, Dry Needling, Joint mobilization, Joint manipulation, Spinal manipulation, Spinal mobilization, Cryotherapy, and Moist heat.  PLAN FOR NEXT SESSION: Floor to waist lifting assessment. Update HEP as needed. Repeated motions testing. Periscapular strengthening. Cervical and  thoracic range of motion.    Luretha Murphy. Ilsa Iha, PT, DPT 03/14/23, 8:45 PM  [facility] [address] [place], [place] [address] P: [phone] I F: [phone]

## 2023-03-16 ENCOUNTER — Ambulatory Visit: Payer: 59 | Admitting: Physical Therapy

## 2023-03-16 ENCOUNTER — Encounter: Payer: Self-pay | Admitting: Physical Therapy

## 2023-03-16 DIAGNOSIS — M792 Neuralgia and neuritis, unspecified: Secondary | ICD-10-CM

## 2023-03-16 DIAGNOSIS — M542 Cervicalgia: Secondary | ICD-10-CM | POA: Diagnosis not present

## 2023-03-16 DIAGNOSIS — M546 Pain in thoracic spine: Secondary | ICD-10-CM | POA: Diagnosis not present

## 2023-03-16 DIAGNOSIS — M6281 Muscle weakness (generalized): Secondary | ICD-10-CM

## 2023-03-16 NOTE — Therapy (Addendum)
 OUTPATIENT PHYSICAL THERAPY TREATMENT   Patient Name: Chase Stewart MRN: [id number] DOB:01-13-1985, 38 y.o., male Today's Date: 03/16/2023  END OF SESSION:  PT End of Session - 03/16/23 1839     Visit Number 4    Number of Visits 17    Date for PT Re-Evaluation 05/17/23    Authorization Type [employer] 2025 - reporting from 02/22/23    Authorization Time Period 30 per yr    Authorization - Visit Number 4    Authorization - Number of Visits 30    Progress Note Due on Visit 10    PT Start Time 1736    PT Stop Time 1821    PT Time Calculation (min) 45 min    Activity Tolerance Patient tolerated treatment well    Behavior During Therapy [facility] for tasks assessed/performed                Past Medical History:  Diagnosis Date   Anxiety    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   Chronic kidney disease    h/o kidney stones   Depression    Migraines    Past Surgical History:  Procedure Laterality Date   ROTATOR CUFF REPAIR Right    SHOULDER ARTHROSCOPY WITH OPEN ROTATOR CUFF REPAIR AND DISTAL CLAVICLE ACROMINECTOMY Left 05/26/2015   Procedure: SHOULDER ARTHROSCOPY WITH ANTERIOR AND SUPERIOR LABRAL REPAIR;  Surgeon: Juanell Fairly, MD;  Location: [facility] ORS;  Service: Orthopedics;  Laterality: Left;   TONSILLECTOMY     TYMPANOSTOMY TUBE PLACEMENT     Patient Active Problem List   Diagnosis Date Noted   Abnormal MRI, cervical spine (01/16/2023) 03/06/2023   Abnormal MRI, thoracic spine (11/28/2022) 03/06/2023   Thoracic facet joint pain (Left) 03/06/2023   Chronic thoracic back pain (1ry area of Pain) (Left) 03/06/2023   Chronic pain syndrome 03/05/2023   Pharmacologic therapy 03/05/2023   Disorder of skeletal system 03/05/2023   Problems influencing health status 03/05/2023   Depression 09/21/2022   DDD (degenerative disc disease), thoracic 06/17/2021   Anxiety 06/17/2021   Acute back pain 04/02/2020   Rib pain 04/02/2020   History of herpes labialis 12/28/2015   Shoulder pain  08/28/2015    PCP: No PCP  REFERRING PROVIDER: Elijah Birk, MD  REFERRING DIAG: Cervicalgia, Cervical radiculitis   Rationale for Evaluation and Treatment: Rehabilitation  THERAPY DIAG:  Cervicalgia  Pain in thoracic spine  Muscle weakness (generalized)  Neuralgia and neuritis  ONSET DATE: Thoracic spine pain about 3 years ago, Cervical spine pain about 6 months ago  SUBJECTIVE:  PERTINENT HISTORY:  Patient is a 38 y.o. male who presents to outpatient physical therapy with a referral for medical diagnosis cervicalgia and cervical radiculitis. This patient's chief complaints consist of chronic, constant, and aching thoracic spine pain that has progressed up to his neck and bilateral shoulders, leading to the following functional deficits: difficulty with sleeping, twisting, lifting, driving, working, and playing with his children. Relevant past medical history and comorbidities include anxiety/depression, bradycardia, and migraines.    SUBJECTIVE STATEMENT: Patient states his shoulders were a little sore after last PT session but did not have any increased pain in his thoracic spine or neck.    PAIN:  Are you having pain? Yes NPRS: Current: 0/10 at rest 5/10 when performing asterisk sign in neck, 2/10 left thoracic spine  PRECAUTIONS: None  RED FLAGS: Patient denies hx of cancer, stroke, seizures, lung problems, heart problems, diabetes, unexplained weight loss, unexplained changes in bowel or bladder problems, unexplained stumbling or dropping things, osteoporosis, and spinal surgery   PLOF: Independent  PATIENT GOALS: strengthening muscles, help with stability, relieve some pressure on joints   NEXT MD VISIT: left thoracic spine injection 03/23/2023.   OBJECTIVE:    TREATMENT:                                                                                                                               Therapeutic exercise: therapeutic exercises that incorporate ONE parameter at one or more areas of the body to centralize symptoms, develop strength and endurance, range of motion, and flexibility required for successful completion of functional activities.  Asterisk sign:  Neck: left cervical rotation+sidebending+extension Thoracic spine: right sidebending Repeated throughout session after interventions to determine success of intervention.   Upper body ergometer level 10 to encourage joint nutrition, warm tissue, induce analgesic effect of aerobic exercise, improve muscular strength and endurance,  and prepare for remainder of session.  5 min total, switching directions every 1 min  Bent over row on chair 3x10 each side with 40#KB  Standing pallof press with cable 3x10 each side with 15# cable Affects left thoracic spine with anchor on left but feels harder with anchor on right Added to HEP with handout  Seated thoracic AROM in all directions to find asterisk sign (right sidebending)  Seated left thoracic rotation with self overpressure 1x10 end range pain each time No change in thoracic asterisk sign  Kneeling prayer stretch 2x10 Feels stretch in thoracic spine pain area Better thoracic asterisk sign after first set No further improvement after 2nd set Added to HEP with handout  Standing thoracic extension stretch on wall 1x10 with 1 second hold Able to perform good technique No change in thoracic asterisk sign  Prone press up with hands placed at head level to preferentially bias thoracic spine 1x10 No improvement in thoracic asterisk sign  Education on HEP including handout   Manual therapy: to reduce pain and tissue tension, improve range of motion, neuromodulation, in  order to promote improved ability to complete functional activities.  Seated  thoracic extension with clinician overpressure 1x6 No further improvement of right T sidebending  PRONE CPAs along Thoracic spine grade III Repeated CPAs grade IV at approx T5-T7, 1-2x30-40 seconds each level  Reproduced left sided thoracic spine pain No improvement in thoracic asterisk sign  Pt required multimodal cuing for proper technique and to facilitate improved neuromuscular control, strength, range of motion, and functional ability resulting in improved performance and form.   PATIENT EDUCATION:  Education details: Exercise purpose/form. Self management techniques.  Education on HEP including handout. Person educated: Patient Education method: Medical illustrator Education comprehension: verbalized understanding, returned demonstration, and needs further education  HOME EXERCISE PROGRAM: Access Code: [email or web address] URL: [email or web address] Date: 03/15/2023 Prepared by: Norton Blizzard  Exercises - Gentle Levator Scapulae Stretch  - 1 x daily - 2 sets - 30 seconds hold - Seated Posture with Lumbar Roll  - Prone Cervical Retraction  - 3 x weekly - 2 reps - 10 seconds hold - Single Arm Bent Over Shoulder Horizontal Abduction with Dumbbell - Palm Down  - 3 x weekly - 2-3 sets - 15 reps - Single Arm Bent Over Shoulder Scaption with Dumbbell  - 3 x weekly - 2-3 sets - 15 reps-   HOME EXERCISE PROGRAM [[id number]] View at [email or web address] using code: [email or web address] Prayer Stretch -  Repeat 1 Repetition, Hold 2 Seconds, Complete 10 Sets, Perform 2 Times a Day  ASSESSMENT:  CLINICAL IMPRESSION: Patient arrives with good tolerance to last PT session but no significant change in cervical spine pain or left thoracic spine pain. Today's session continued with some shoulder girdle,  periscapular and trunk strengthening to improve active support of the spine and ribcage. Patient had some discomfort at the left thoracic region with pallof press with left anchor and left  rows, but not intolerable. Attention was then given to testing of repeated motions to help relive left thoracic spine pain. Patient had mild improvement after prayer stretch, but further progressions of extension did not produce further improvement. He reported no increase in pain by end of session, although during session he voiced expectation he would be sore later. He had reproduction of left thoracic spine pain with CPA to T5-T7, most concentrated at T6. HEP was updated with pallof press and prayer stretch for strength and to help control thoracic spine pain. Plan to continue working on postural strengthening and interventions for improved pain control next session. Patient would benefit from continued management of limiting condition by skilled physical therapist to address remaining impairments and functional limitations to work towards stated goals and return to PLOF or maximal functional independence.   From Initial PT Evaluation on 02/22/2023:  Patient is a 38 y.o. male referred to outpatient physical therapy with a medical diagnosis of cervicalgia and cervical radiculitis who presents with signs and symptoms consistent with cervical spine and thoracic spine pain with mobility deficits, cervical and thoracic spine pain with strength deficits, and cervical and thoracic pain with radiating symptoms to B shoulders/upper extremities (L > R) . Patient presents with cervical/thoracic range of motion, periscapular strength, and activity tolerance impairments that are limiting ability to complete tasks such as twisting, lifting, driving, working, and playing with his children without difficulty. Patient had equal and intact sensation to the upper extremities, had pain local to the left side of his neck with extension+left lateral flexion+left rotation during Spurling's that did not peripheralize to the left UE, and experienced no change in  symptoms with cervical distraction. These findings lower the likelihood of  cervical radiculopathy contributing to the patient's chief concern. Patient will benefit from skilled physical therapy intervention to address current body structure impairments and activity limitations to improve function and work towards goals set in current POC in order to return to prior level of function or maximal functional improvement.    OBJECTIVE IMPAIRMENTS: decreased activity tolerance, decreased endurance, decreased knowledge of condition, decreased mobility, decreased ROM, decreased strength, hypomobility, impaired sensation, and pain.   ACTIVITY LIMITATIONS: carrying, lifting, sitting, sleeping, and caring for others  PARTICIPATION LIMITATIONS: meal prep, cleaning, laundry, interpersonal relationship, driving, community activity, and occupation  PERSONAL FACTORS: Past/current experiences, Time since onset of injury/illness/exacerbation, and 3+ comorbidities: anxiety/depression, migraines, bradycardia  are also affecting patient's functional outcome.   REHAB POTENTIAL: Fair chronicity of condition, unclear diagnosis/cause of current pain   CLINICAL DECISION MAKING: Evolving/moderate complexity  EVALUATION COMPLEXITY: Moderate   GOALS: Goals reviewed with patient? No  SHORT TERM GOALS: Target date: 03/08/2023  Patient will be independent with initial home exercise program for self-management of symptoms. Baseline: Initial HEP provided at IE (02/22/23); Goal status: In progress  Patient will demonstrate improvement in Patient Specific Functional Scale (PSFS) of equal or greater than 3 points to reflect clinically significant improvement in patient's most valued functional activities.. Baseline: to be measured at visit 2 as appropriate (02/22/23); 5/10 at visit #2 (03/08/2023) Goal status: In Progress  3. Patient will demonstrate improvement in Neck Disability Index (NDI) by equal or greater than 10 percentage points (MCID) to reflect clinically significant improvement in  overall condition and self-reported functional ability. Baseline: 22% (02/22/23); Goal status: In progress  4. Patient will report improvement in NPRS of equal or greater than 2 points during functional activities to improve their abilitly to complete community, work and/or recreational activities with less limitation. Baseline: up to 8/10 with activity (02/22/23); Goal status: In progress   LONG TERM GOALS: Target date: 05/17/2023  Patient will be independent with a long-term home exercise program for self-management of symptoms.  Baseline: Initial HEP provided at IE (02/22/23); Goal status: In progress  2.  Patient will demonstrate improved Neck Disability Index (NDI) to equal or less than 10% to demonstrate improvement in overall condition and self-reported functional ability.  Baseline: 22% (02/22/23); Goal status: In progress  3.  Patient will demonstrate a 10% increase in weight lifted from floor to waist to be able to complete lifting activities with less difficulty.   Baseline: to be measured at visit 2 as appropriate (02/22/23); Goal status: In progress  4.  Patient will demonstrate improvement in lower trapezius strength to equal to or greater than 4/5 bilaterally for improved postural strength/endurance to make prolonged sitting/standing less difficult.  Baseline: 3/5 bilaterally (02/22/23); Goal status: In progress  5.  Patient will demonstrate improvement in Patient Specific Functional Scale (PSFS) of equal or greater than 8/10 points to reflect clinically significant improvement in patient's most valued functional activities. Baseline: to be measured at visit 2 as appropriate (02/22/23); 5/10 at visit #2 (03/08/2023) Goal status: In Progress  6.  Patient will report NPRS equal or less than 3/10 during functional activities during the last 2 weeks to improve their abilitly to complete community, work and/or recreational activities with less limitation. Baseline: up to 8/10  with activity (02/22/23); Goal status: In progress   PLAN:  PT FREQUENCY: 1-2x/week  PT DURATION: 8-12 weeks  PLANNED INTERVENTIONS: 97164- PT Re-evaluation, 97110-Therapeutic exercises, 97530- Therapeutic activity, [id number]- Neuromuscular  re-education, [phone]- Self Care, 91478- Manual therapy, 97014- Electrical stimulation (unattended), [phone]- Electrical stimulation (manual), Patient/Family education, Dry Needling, Joint mobilization, Joint manipulation, Spinal manipulation, Spinal mobilization, Cryotherapy, and Moist heat.  PLAN FOR NEXT SESSION: Floor to waist lifting assessment as appropriate.  Update HEP as needed. Dry needling. Periscapular strengthening.   Luretha Murphy. Ilsa Iha, PT, DPT 03/16/23, 6:45 PM  [facility] [address] [place], [place] [address] P: [phone] I F: [phone]

## 2023-03-20 ENCOUNTER — Ambulatory Visit: Payer: 59 | Admitting: Physical Therapy

## 2023-03-20 ENCOUNTER — Telehealth: Payer: Self-pay

## 2023-03-20 NOTE — Telephone Encounter (Signed)
 Pt did not arrive for scheduled visit this date. Author called pt at listed number. Pt reports he contacted clinic this morning, spoke to staff, canceled appointment. Pt is aware of next scheduled appointment.   4:58 PM, 03/20/23 Rosamaria Lints, PT, DPT Physical Therapist - [place] [phone] (Office)

## 2023-03-22 ENCOUNTER — Ambulatory Visit: Payer: 59

## 2023-03-22 DIAGNOSIS — M546 Pain in thoracic spine: Secondary | ICD-10-CM

## 2023-03-22 DIAGNOSIS — M6281 Muscle weakness (generalized): Secondary | ICD-10-CM | POA: Diagnosis not present

## 2023-03-22 DIAGNOSIS — M792 Neuralgia and neuritis, unspecified: Secondary | ICD-10-CM

## 2023-03-22 DIAGNOSIS — M542 Cervicalgia: Secondary | ICD-10-CM | POA: Diagnosis not present

## 2023-03-22 NOTE — Therapy (Signed)
 OUTPATIENT PHYSICAL THERAPY TREATMENT   Patient Name: Chase Stewart MRN: [id number] DOB:08-Nov-1985, 38 y.o., male Today's Date: 03/22/2023  END OF SESSION:  PT End of Session - 03/22/23 1442     Visit Number 5    Number of Visits 17    Date for PT Re-Evaluation 05/17/23    Authorization Type [employer] 2025 - reporting from 02/22/23    Authorization - Visit Number 5    Authorization - Number of Visits 30    Progress Note Due on Visit 10    PT Start Time 1437    PT Stop Time 1507    PT Time Calculation (min) 30 min    Activity Tolerance Patient tolerated treatment well;No increased pain    Behavior During Therapy [facility] for tasks assessed/performed                Past Medical History:  Diagnosis Date   Anxiety    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   Chronic kidney disease    h/o kidney stones   Depression    Migraines    Past Surgical History:  Procedure Laterality Date   ROTATOR CUFF REPAIR Right    SHOULDER ARTHROSCOPY WITH OPEN ROTATOR CUFF REPAIR AND DISTAL CLAVICLE ACROMINECTOMY Left 05/26/2015   Procedure: SHOULDER ARTHROSCOPY WITH ANTERIOR AND SUPERIOR LABRAL REPAIR;  Surgeon: Juanell Fairly, MD;  Location: [facility] ORS;  Service: Orthopedics;  Laterality: Left;   TONSILLECTOMY     TYMPANOSTOMY TUBE PLACEMENT     Patient Active Problem List   Diagnosis Date Noted   Abnormal MRI, cervical spine (01/16/2023) 03/06/2023   Abnormal MRI, thoracic spine (11/28/2022) 03/06/2023   Thoracic facet joint pain (Left) 03/06/2023   Chronic thoracic back pain (1ry area of Pain) (Left) 03/06/2023   Chronic pain syndrome 03/05/2023   Pharmacologic therapy 03/05/2023   Disorder of skeletal system 03/05/2023   Problems influencing health status 03/05/2023   Depression 09/21/2022   DDD (degenerative disc disease), thoracic 06/17/2021   Anxiety 06/17/2021   Acute back pain 04/02/2020   Rib pain 04/02/2020   History of herpes labialis 12/28/2015   Shoulder pain 08/28/2015    PCP: No  PCP  REFERRING PROVIDER: Elijah Birk, MD  REFERRING DIAG: Cervicalgia, Cervical radiculitis   Rationale for Evaluation and Treatment: Rehabilitation  THERAPY DIAG:  Cervicalgia  Pain in thoracic spine  Muscle weakness (generalized)  Neuralgia and neuritis  ONSET DATE: Thoracic spine pain about 3 years ago, Cervical spine pain about 6 months ago  SUBJECTIVE:  PERTINENT HISTORY:  Patient is a 38 y.o. male who presents to outpatient physical therapy with a referral for medical diagnosis cervicalgia and cervical radiculitis. This patient's chief complaints consist of chronic, constant, and aching thoracic spine pain that has progressed up to his neck and bilateral shoulders, leading to the following functional deficits: difficulty with sleeping, twisting, lifting, driving, working, and playing with his children. Relevant past medical history and comorbidities include anxiety/depression, bradycardia, and migraines.    SUBJECTIVE STATEMENT: Pt says he's doing fine with HEP. Pain about the same. He goes for costovertebral joint intjection tomorrow. (Diagnositc)   PAIN:  Are you having pain? No resting pain on arrival. Intermittent pain   PRECAUTIONS: None  RED FLAGS: Patient denies hx of cancer, stroke, seizures, lung problems, heart problems, diabetes, unexplained weight loss, unexplained changes in bowel or bladder problems, unexplained stumbling or dropping things, osteoporosis, and spinal surgery   PLOF: Independent  PATIENT GOALS: strengthening muscles, help with stability, relieve some pressure on joints   NEXT MD VISIT: left thoracic spine injection 03/23/2023.   OBJECTIVE:  -remarkable degree of trigger points and spastic muscles in t spine and medial scapular groups   -significant segmental  thoracic spine hypomobility T3-T9    TREATMENT:                                                                                                                              -UBE level 8 x5 minutes, alternating directions -prone dorsal scapular trigger point assessment, Thoracic mobility assessment  -hooklying on 1/2 roll Airplane 3x60sec  -hooklying on 1/2 roll sided bar wad flexion x20 (tolerable range)  -chest press for t spine mobility 3x10 45lb -book openings 1x15 bilat  -total gym level 10 resisted trunk rotation 1x12 bilat -total gym single arm row 1x12 bilat     PATIENT EDUCATION:  Education details: Exercise purpose/form. Self management techniques.  Education on HEP including handout. Person educated: Patient Education method: Medical illustrator Education comprehension: verbalized understanding, returned demonstration, and needs further education  HOME EXERCISE PROGRAM: Access Code: [email or web address] URL: [email or web address] Date: 03/15/2023 Prepared by: Norton Blizzard  Exercises - Gentle Levator Scapulae Stretch  - 1 x daily - 2 sets - 30 seconds hold - Seated Posture with Lumbar Roll  - Prone Cervical Retraction  - 3 x weekly - 2 reps - 10 seconds hold - Single Arm Bent Over Shoulder Horizontal Abduction with Dumbbell - Palm Down  - 3 x weekly - 2-3 sets - 15 reps - Single Arm Bent Over Shoulder Scaption with Dumbbell  - 3 x weekly - 2-3 sets - 15 reps-   HOME EXERCISE PROGRAM [[id number]] View at [email or web address] using code: [id number] Prayer Stretch -  Repeat 1 Repetition, Hold 2 Seconds, Complete 10 Sets, Perform 2 Times a Day  ASSESSMENT:  CLINICAL IMPRESSION: Continued with progressive mobility and strength program. Offered to perform dry needling, but then decided against it as he will go for injection  in joint tomorrow and did not want to add on adjacent symptomology. Explained the role of spinal hypomobility in continued muscle overload.  Patient would benefit from continued management of limiting condition by skilled physical therapist to address remaining impairments and functional limitations to work towards stated goals and return to PLOF or maximal functional independence.    OBJECTIVE IMPAIRMENTS: decreased activity tolerance, decreased endurance, decreased knowledge of condition, decreased mobility, decreased ROM, decreased strength, hypomobility, impaired sensation, and pain.   ACTIVITY LIMITATIONS: carrying, lifting, sitting, sleeping, and caring for others  PARTICIPATION LIMITATIONS: meal prep, cleaning, laundry, interpersonal relationship, driving, community activity, and occupation  PERSONAL FACTORS: Past/current experiences, Time since onset of injury/illness/exacerbation, and 3+ comorbidities: anxiety/depression, migraines, bradycardia  are also affecting patient's functional outcome.   REHAB POTENTIAL: Fair chronicity of condition, unclear diagnosis/cause of current pain   CLINICAL DECISION MAKING: Evolving/moderate complexity  EVALUATION COMPLEXITY: Moderate   GOALS: Goals reviewed with patient? No  SHORT TERM GOALS: Target date: 03/08/2023  Patient will be independent with initial home exercise program for self-management of symptoms. Baseline: Initial HEP provided at IE (02/22/23); Goal status: In progress  Patient will demonstrate improvement in Patient Specific Functional Scale (PSFS) of equal or greater than 3 points to reflect clinically significant improvement in patient's most valued functional activities.. Baseline: to be measured at visit 2 as appropriate (02/22/23); 5/10 at visit #2 (03/08/2023) Goal status: In Progress  3. Patient will demonstrate improvement in Neck Disability Index (NDI) by equal or greater than 10 percentage points (MCID) to reflect clinically significant improvement in overall condition and self-reported functional ability. Baseline: 22% (02/22/23); Goal status: In  progress  4. Patient will report improvement in NPRS of equal or greater than 2 points during functional activities to improve their abilitly to complete community, work and/or recreational activities with less limitation. Baseline: up to 8/10 with activity (02/22/23); Goal status: In progress   LONG TERM GOALS: Target date: 05/17/2023  Patient will be independent with a long-term home exercise program for self-management of symptoms.  Baseline: Initial HEP provided at IE (02/22/23); Goal status: In progress  2.  Patient will demonstrate improved Neck Disability Index (NDI) to equal or less than 10% to demonstrate improvement in overall condition and self-reported functional ability.  Baseline: 22% (02/22/23); Goal status: In progress  3.  Patient will demonstrate a 10% increase in weight lifted from floor to waist to be able to complete lifting activities with less difficulty.   Baseline: to be measured at visit 2 as appropriate (02/22/23); Goal status: In progress  4.  Patient will demonstrate improvement in lower trapezius strength to equal to or greater than 4/5 bilaterally for improved postural strength/endurance to make prolonged sitting/standing less difficult.  Baseline: 3/5 bilaterally (02/22/23); Goal status: In progress  5.  Patient will demonstrate improvement in Patient Specific Functional Scale (PSFS) of equal or greater than 8/10 points to reflect clinically significant improvement in patient's most valued functional activities. Baseline: to be measured at visit 2 as appropriate (02/22/23); 5/10 at visit #2 (03/08/2023) Goal status: In Progress  6.  Patient will report NPRS equal or less than 3/10 during functional activities during the last 2 weeks to improve their abilitly to complete community, work and/or recreational activities with less limitation. Baseline: up to 8/10 with activity (02/22/23); Goal status: In progress PLAN:  PT FREQUENCY: 1-2x/week  PT DURATION:  8-12 weeks  PLANNED INTERVENTIONS: 97164- PT Re-evaluation, 97110-Therapeutic exercises, 97530- Therapeutic activity, [id number]- Neuromuscular re-education, 97535- Self Care, 16109- Manual  therapy, 97014- Electrical stimulation (unattended), [phone]- Electrical stimulation (manual), Patient/Family education, Dry Needling, Joint mobilization, Joint manipulation, Spinal manipulation, Spinal mobilization, Cryotherapy, and Moist heat.  PLAN FOR NEXT SESSION: Floor to waist lifting assessment as appropriate.  Update HEP as needed. Dry needling. Periscapular strengthening, thoracic spine mobility.

## 2023-03-23 ENCOUNTER — Encounter: Payer: Self-pay | Admitting: Pain Medicine

## 2023-03-23 ENCOUNTER — Ambulatory Visit: Attending: Pain Medicine | Admitting: Pain Medicine

## 2023-03-23 ENCOUNTER — Ambulatory Visit
Admission: RE | Admit: 2023-03-23 | Discharge: 2023-03-23 | Disposition: A | Discharge status: Home / Self Care | Source: Ambulatory Visit | Attending: Pain Medicine | Admitting: Pain Medicine

## 2023-03-23 VITALS — BP 127/75 | HR 60 | Temp 98.2°F | Resp 15 | Ht 69.0 in | Wt 150.0 lb

## 2023-03-23 DIAGNOSIS — R937 Abnormal findings on diagnostic imaging of other parts of musculoskeletal system: Secondary | ICD-10-CM | POA: Diagnosis present

## 2023-03-23 DIAGNOSIS — M899 Disorder of bone, unspecified: Secondary | ICD-10-CM | POA: Diagnosis present

## 2023-03-23 DIAGNOSIS — R0781 Pleurodynia: Secondary | ICD-10-CM | POA: Diagnosis present

## 2023-03-23 DIAGNOSIS — Z789 Other specified health status: Secondary | ICD-10-CM

## 2023-03-23 DIAGNOSIS — M5134 Other intervertebral disc degeneration, thoracic region: Secondary | ICD-10-CM | POA: Diagnosis present

## 2023-03-23 DIAGNOSIS — M546 Pain in thoracic spine: Secondary | ICD-10-CM

## 2023-03-23 DIAGNOSIS — G8929 Other chronic pain: Secondary | ICD-10-CM | POA: Diagnosis present

## 2023-03-23 DIAGNOSIS — G894 Chronic pain syndrome: Secondary | ICD-10-CM

## 2023-03-23 MED ORDER — FENTANYL CITRATE (PF) 100 MCG/2ML IJ SOLN
INTRAMUSCULAR | Status: AC
  Filled 2023-03-23: qty 2

## 2023-03-23 MED ORDER — MIDAZOLAM HCL 5 MG/5ML IJ SOLN
INTRAMUSCULAR | Status: AC
  Filled 2023-03-23: qty 5

## 2023-03-23 MED ORDER — FENTANYL CITRATE (PF) 100 MCG/2ML IJ SOLN
25.0000 ug | INTRAMUSCULAR | Status: DC | PRN
  Administered 2023-03-23: 50 ug via INTRAVENOUS

## 2023-03-23 MED ORDER — DEXAMETHASONE SODIUM PHOSPHATE 10 MG/ML IJ SOLN
INTRAMUSCULAR | Status: AC
  Filled 2023-03-23: qty 1

## 2023-03-23 MED ORDER — ROPIVACAINE HCL 2 MG/ML IJ SOLN
INTRAMUSCULAR | Status: AC
  Filled 2023-03-23: qty 20

## 2023-03-23 MED ORDER — ROPIVACAINE HCL 2 MG/ML IJ SOLN
9.0000 mL | Freq: Once | INTRAMUSCULAR | Status: AC
  Administered 2023-03-23: 9 mL via PERINEURAL

## 2023-03-23 MED ORDER — LIDOCAINE HCL 2 % IJ SOLN
20.0000 mL | Freq: Once | INTRAMUSCULAR | Status: AC
  Administered 2023-03-23: 400 mg

## 2023-03-23 MED ORDER — DEXAMETHASONE SODIUM PHOSPHATE 10 MG/ML IJ SOLN
10.0000 mg | Freq: Once | INTRAMUSCULAR | Status: AC
  Administered 2023-03-23: 10 mg

## 2023-03-23 MED ORDER — MIDAZOLAM HCL 5 MG/5ML IJ SOLN
0.5000 mg | Freq: Once | INTRAMUSCULAR | Status: AC
Start: 2023-03-23 — End: 2023-03-23
  Administered 2023-03-23: 2 mg via INTRAVENOUS

## 2023-03-23 MED ORDER — PENTAFLUOROPROP-TETRAFLUOROETH EX AERO: INHALATION_SPRAY | Freq: Once | CUTANEOUS | Status: DC

## 2023-03-23 MED ORDER — LIDOCAINE HCL 2 % IJ SOLN
INTRAMUSCULAR | Status: AC
  Filled 2023-03-23: qty 20

## 2023-03-23 NOTE — Patient Instructions (Signed)

## 2023-03-23 NOTE — Progress Notes (Signed)
 PROVIDER NOTE: Interpretation of information contained herein should be left to medically-trained personnel. Specific patient instructions are provided elsewhere under "Patient Instructions" section of medical record. This document was created in part using STT-dictation technology, any transcriptional errors that may result from this process are unintentional.  Patient: Chase Stewart Type: Established DOB: [birth year] MRN: [id number] PCP: Patient, No Pcp Per  Service: Procedure DOS: 03/23/2023 Setting: Ambulatory Location: Ambulatory outpatient facility Delivery: Face-to-face Provider: Oswaldo Done, MD Specialty: Interventional Pain Management Specialty designation: 09 Location: Outpatient facility Ref. Prov.: Delano Metz, MD       Interventional Therapy   Procedure: Thoracic facet medial branch block/costotransverse #1  Laterality: Left (-LT)  Level: T6, T7, and T8 Medial Branch Level(s)  Imaging: Fluoroscopy-guided Spinal ([id number]) Anesthesia: Local anesthesia (1-2% Lidocaine) Anxiolysis: IV Versed 2.0 mg Sedation: Moderate Sedation Fentanyl 1 mL (50 mcg) DOS: 03/23/2023  Performed by: Oswaldo Done, MD  Purpose: Diagnostic/Therapeutic Indications: Thoracic back pain severe enough to impact quality of life or function. Rationale (medical necessity): procedure needed and proper for the diagnosis and/or treatment of Chase Stewart medical symptoms and needs. 1. Chronic thoracic back pain (1ry area of Pain) (Left)   2. 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 DDD (degenerative disc disease), thoracic   4. Rib pain   5. Thoracic facet joint pain (Left)   6. Abnormal MRI, thoracic spine (11/28/2022)   7. Abnormal MRI, cervical spine (01/16/2023)    NAS-11 Pain score:   Pre-procedure: 4 /10   Post-procedure: 0-No pain/10     Target: Thoracic posterior (dorsal) primary rami nerve Location: 2.5 cm lateral to midline (spinous process), just  cephalad to upper transverse process edge.  (needle tip placement) Region: Thoracic  Approach: Paravertebral  Type of procedure: Percutaneous perineural nerve block   Pertinent Anatomy: There are two potential fascial compartments in the TPVS: the anterior extrapleural paravertebral compartment and the posterior subendothoracic paravertebral compartment. The transverse process and the superior costotransverse ligament form the posterior boundary.  Position / Prep / Materials:  Position: Prone  Prep solution: ChloraPrep (2% chlorhexidine gluconate and 70% isopropyl alcohol) Prep Area: Entire upper back region Materials:  Tray: Block Needle(s):  Type: Spinal  Gauge (G): 22  Length: 3.5-in  Qty: 3   H&P (Pre-op Assessment):  Chase Stewart is a 38 y.o. (year old), male patient, seen today for interventional treatment. He  has a past surgical history that includes Rotator cuff repair (Right); Tonsillectomy; Tympanostomy tube placement; and Shoulder arthroscopy with open rotator cuff repair and distal clavicle acrominectomy (Left, 05/26/2015). Chase Stewart has a current medication list which includes the following prescription(s): alpha-lipoic acid, ibuprofen, naproxen, pregabalin, turmeric, and valacyclovir, and the following Facility-Administered Medications: fentanyl and pentafluoroprop-tetrafluoroeth. His primarily concern today is the Back Pain (Mid left )  Initial Vital Signs:  Pulse/HCG Rate: 60ECG Heart Rate: 68 (nsr) Temp: 98.2 F (36.8 C) Resp: 14 BP: 118/81 SpO2: 100 %  BMI: Estimated body mass index is 22.15 kg/m as calculated from the following:   Height as of this encounter: 5\' 9"  (1.753 m).   Weight as of this encounter: 150 lb (68 kg).  Risk Assessment: Allergies: Reviewed. He has no known allergies.  Allergy Precautions: None required Coagulopathies: Reviewed. None identified.  Blood-thinner therapy: None at this time Active Infection(s): Reviewed. None identified. Mr.  Stewart is afebrile  Site Confirmation: Chase Stewart was asked to confirm the procedure and laterality before marking the site Procedure checklist: Completed Consent: Before the  procedure and under the influence of no sedative(s), amnesic(s), or anxiolytics, the patient was informed of the treatment options, risks and possible complications. To fulfill our ethical and legal obligations, as recommended by the American Medical Association's Code of Ethics, I have informed the patient of my clinical impression; the nature and purpose of the treatment or procedure; the risks, benefits, and possible complications of the intervention; the alternatives, including doing nothing; the risk(s) and benefit(s) of the alternative treatment(s) or procedure(s); and the risk(s) and benefit(s) of doing nothing. The patient was provided information about the general risks and possible complications associated with the procedure. These may include, but are not limited to: failure to achieve desired goals, infection, bleeding, organ or nerve damage, allergic reactions, paralysis, and death. In addition, the patient was informed of those risks and complications associated to Spine-related procedures, such as failure to decrease pain; infection (i.e.: Meningitis, epidural or intraspinal abscess); bleeding (i.e.: epidural hematoma, subarachnoid hemorrhage, or any other type of intraspinal or peri-dural bleeding); organ or nerve damage (i.e.: Any type of peripheral nerve, nerve root, or spinal cord injury) with subsequent damage to sensory, motor, and/or autonomic systems, resulting in permanent pain, numbness, and/or weakness of one or several areas of the body; allergic reactions; (i.e.: anaphylactic reaction); and/or death. Furthermore, the patient was informed of those risks and complications associated with the medications. These include, but are not limited to: allergic reactions (i.e.: anaphylactic or anaphylactoid reaction(s));  adrenal axis suppression; blood sugar elevation that in diabetics may result in ketoacidosis or comma; water retention that in patients with history of congestive heart failure may result in shortness of breath, pulmonary edema, and decompensation with resultant heart failure; weight gain; swelling or edema; medication-induced neural toxicity; particulate matter embolism and blood vessel occlusion with resultant organ, and/or nervous system infarction; and/or aseptic necrosis of one or more joints. Finally, the patient was informed that Medicine is not an exact science; therefore, there is also the possibility of unforeseen or unpredictable risks and/or possible complications that may result in a catastrophic outcome. The patient indicated having understood very clearly. We have given the patient no guarantees and we have made no promises. Enough time was given to the patient to ask questions, all of which were answered to the patient's satisfaction. Mr. Marcy has indicated that he wanted to continue with the procedure. Attestation: I, the ordering provider, attest that I have discussed with the patient the benefits, risks, side-effects, alternatives, likelihood of achieving goals, and potential problems during recovery for the procedure that I have provided informed consent. Date  Time: 03/23/2023  8:58 AM   Pre-Procedure Preparation:  Monitoring: As per clinic protocol. Respiration, ETCO2, SpO2, BP, heart rate and rhythm monitor placed and checked for adequate function Safety Precautions: Patient was assessed for positional comfort and pressure points before starting the procedure. Time-out: I initiated and conducted the "Time-out" before starting the procedure, as per protocol. The patient was asked to participate by confirming the accuracy of the "Time Out" information. Verification of the correct person, site, and procedure were performed and confirmed by me, the nursing staff, and the patient.  "Time-out" conducted as per Joint Commission's Universal Protocol (UP.01.01.01). Time: 0929 Start Time: 0929 hrs.  Description/Narrative of Procedure:          Start Time: 0929 hrs. Technical description of procedure:  Rationale (medical necessity): procedure needed and proper for the diagnosis and/or treatment of the patient's medical symptoms and needs. Procedural Technique Safety Precautions: Aspiration looking for blood  return was conducted prior to all injections. At no point did we inject any substances, as a needle was being advanced. No attempts were made at seeking any paresthesias. Safe injection practices and needle disposal techniques used. Medications properly checked for expiration dates. SDV (single dose vial) medications used. Target Area: For the thoracic medial branch nerve, the target is located between the top of the transverse processes at the point where the transverse process joints the vertebra and the superior-lateral aspect of the transverse process.  (More lateral towards the superior aspect of the thoracic spine.) For safety reasons and to minimize the risk of hemo- or pneumothorax, the needle tip was not advanced past the boundary of the posterior paravertebral compartment (superior costotransverse ligament). Description of the Procedure: Protocol guidelines were followed. The patient was placed in position over the fluoroscopy table. The target area was identified and the area prepped in the usual manner. Skin & deeper tissues infiltrated with local anesthetic. Appropriate amount of time allowed to pass for local anesthetics to take effect. The procedure needles were then advanced to the target area. Proper needle placement secured. Negative aspiration confirmed. Solution injected in intermittent fashion, asking for systemic symptoms every 0.5cc of injectate. The needles were then removed and the area cleansed, making sure to leave some of the prepping solution back to take  advantage of its long term bactericidal properties.             Facet Joint Innervation  T1-2 C8, T1  Medial Branch  T2-3 T1, T2         "          "  T3-4 T2, T3         "          "  T4-5 T3, T4         "          "  T5-6 T4, T5         "          "  T6-7 T5, T6         "          "  T7-8 T6, T7         "          "  T8-9 T7, T8         "          "  T9-10 T8, T9         "          "  T10-11 T9, T10         "          "  T11-12 T10, T11         "          "  T12-L1 T11, T12         "          "    Vitals:   03/23/23 0936 03/23/23 0940 03/23/23 0950 03/23/23 0959  BP: 114/67 115/73 118/73 127/75  Pulse:      Resp: 18 15 16 15   Temp:      TempSrc:      SpO2: 100% 99% 100% 99%  Weight:      Height:         End Time: 0936 hrs.  Imaging Guidance (Spinal):          Type of Imaging Technique: Fluoroscopy  Guidance (Spinal) Indication(s): Fluoroscopy guidance for needle placement to enhance accuracy in procedures requiring precise needle localization for targeted delivery of medication in or near specific anatomical locations not easily accessible without such real-time imaging assistance. Exposure Time: Please see nurses notes. Contrast: None used. Fluoroscopic Guidance: I was personally present during the use of fluoroscopy. "Tunnel Vision Technique" used to obtain the best possible view of the target area. Parallax error corrected before commencing the procedure. "Direction-depth-direction" technique used to introduce the needle under continuous pulsed fluoroscopy. Once target was reached, antero-posterior, oblique, and lateral fluoroscopic projection used confirm needle placement in all planes. Images permanently stored in EMR. Interpretation: No contrast injected. I personally interpreted the imaging intraoperatively. Adequate needle placement confirmed in multiple planes. Permanent images saved into the patient's record.  Post-operative Assessment:  Post-procedure Vital  Signs:  Pulse/HCG Rate: 6070 Temp: 98.2 F (36.8 C) Resp: 15 BP: 127/75 SpO2: 99 %  EBL: None  Complications: No immediate post-treatment complications observed by team, or reported by patient.  Note: The patient tolerated the entire procedure well. A repeat set of vitals were taken after the procedure and the patient was kept under observation following institutional policy, for this type of procedure. Post-procedural neurological assessment was performed, showing return to baseline, prior to discharge. The patient was provided with post-procedure discharge instructions, including a section on how to identify potential problems. Should any problems arise concerning this procedure, the patient was given instructions to immediately contact us, at any time, without hesitation. In any case, we plan to contact the patient by telephone for a follow-up status report regarding this interventional procedure.  Comments:  No additional relevant information.  Plan of Care (POC)  Orders:  Orders Placed This Encounter  Procedures   THORACIC FACET BLOCK    Scheduling Instructions:     Thoracic Medial Branch Block     Side: Left-sided     Level(s): TBD     Sedation: With Sedation.     Timeframe: Today    Where will this procedure be performed?:   [facility]   [facility] C-ARM 1-60 MIN NO REPORT    Intraoperative interpretation by procedural physician at [facility].    Standing Status:   Standing    Number of Occurrences:   1    Reason for exam::   Assistance in needle guidance and placement for procedures requiring needle placement in or near specific anatomical locations not easily accessible without such assistance.   Informed Consent Details: Physician/Practitioner Attestation; Transcribe to consent form and obtain patient signature    Nursing Order: Transcribe to consent form and obtain patient signature. Note: Always confirm laterality of pain with Mr. Quintela,  before procedure.    Physician/Practitioner attestation of informed consent for procedure/surgical case:   I, the physician/practitioner, attest that I have discussed with the patient the benefits, risks, side effects, alternatives, likelihood of achieving goals and potential problems during recovery for the procedure that I have provided informed consent.    Procedure:   Thoracic medial branch facet block    Physician/Practitioner performing the procedure:   Corrinna Karapetyan A. 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    Standing Status:   Standing    Number of Occurrences:   1   Chronic Opioid Analgesic:   None MME/day: 0 mg/day   Medications ordered for procedure: Meds ordered this encounter  Medications   lidocaine (XYLOCAINE) 2 % (with pres) injection 400 mg   pentafluoroprop-tetrafluoroeth (GEBAUERS) aerosol   midazolam (VERSED) 5 MG/5ML injection 0.5-2 mg    Make sure Flumazenil is available in the pyxis when using this medication. If oversedation occurs, administer 0.2 mg IV over 15 sec. If after 45 sec no response, administer 0.2 mg again over 1 min; may repeat at 1 min intervals; not to exceed 4 doses (1 mg)   fentaNYL (SUBLIMAZE) injection 25-50 mcg    Make sure Narcan is available in the pyxis when using this medication. In the event of respiratory depression (RR< 8/min): Titrate NARCAN (naloxone) in increments of 0.1  to 0.2 mg IV at 2-3 minute intervals, until desired degree of reversal.   ropivacaine (PF) 2 mg/mL (0.2%) (NAROPIN) injection 9 mL   dexamethasone (DECADRON) injection 10 mg   Medications administered: We administered lidocaine, midazolam, fentaNYL, ropivacaine (PF) 2 mg/mL (0.2%), and dexamethasone.  See the medical record for exact dosing, route, and time of administration.  Follow-up plan:   Return for (Face2F), (PPE).       Interventional Therapies  Risk Factors  Considerations  Medical Comorbidities:     Planned  Pending:   Diagnostic left thoracic facet block (dorsal rami) (Costotransverse joint) #1 w/ mapping    Under consideration:   Diagnostic left thoracic facet block (dorsal rami) (Costotransverse joint) #1 w/ mapping  Possible follow-up with RFA  The costotransverse joints are innervated by the lateral branch of the thoracic dorsal rami of the spinal nerves.   Completed:   None at this time   Therapeutic  Palliative (PRN) options:   None established   Completed by other providers:   Bilateral T6-7 transforaminal epidural injection Filomena Jungling, MD - 12/15/2022) Left T6-7 transforaminal epidural injection Filomena Jungling, MD - 10/07/2022)  Left ICNB (T5, T6, T7) ([place]) (08/04/2021) by Raechel Chute. (Dr. Lorene Dy) Thoracic interlaminar ESI - TESI (Left) T8-T9 (06/17/2021) by Raechel Chute.  H/O bilateral shoulder surgery.       Recent Visits Date Type Provider Dept  03/06/23 Office Visit Delano Metz, MD [facility]  Showing recent visits within past 90 days and meeting all other requirements Today's Visits Date Type Provider Dept  03/23/23 Procedure visit Delano Metz, MD [facility]  Showing today's visits and meeting all other requirements Future Appointments Date Type Provider Dept  04/11/23 Appointment Delano Metz, MD [facility]  Showing future appointments within next 90 days and  meeting all other requirements  Disposition: Discharge home  Discharge (Date  Time): 03/23/2023; 1001 hrs.   Primary Care Physician: Patient, No Pcp Per Location: [facility] Note by: Oswaldo Done, MD (TTS technology used. I apologize for any typographical errors that were not detected and corrected.) Date: 03/23/2023; Time: 10:03 AM  Disclaimer:  Medicine is not an Visual merchandiser. The only guarantee in medicine is that nothing is guaranteed. It is important to note that the decision to proceed with this intervention was based on the  information collected from the patient. The Data and conclusions were drawn from the patient's questionnaire, the interview, and the physical examination. Because the information was provided in large part by the patient, it cannot be guaranteed that it has not been purposely or unconsciously manipulated. Every effort has been made to obtain as much relevant data as possible for this evaluation. It is important to note that the conclusions that lead to this procedure are derived in large part from the available data. Always take into account that the treatment will also be dependent on availability of resources and existing treatment guidelines, considered by other Pain Management Practitioners as being common knowledge and practice, at the time of the intervention. For Medico-Legal purposes, it is also important to point out that variation in procedural techniques and pharmacological choices are the acceptable norm. The indications, contraindications, technique, and results of the above procedure should only be interpreted and judged by a Board-Certified Interventional Pain Specialist with extensive familiarity and expertise in the same exact procedure and technique.

## 2023-03-24 ENCOUNTER — Telehealth: Payer: Self-pay

## 2023-03-24 NOTE — Telephone Encounter (Signed)
 Post procedure follow up.  Patient states he is doing alright.

## 2023-03-27 ENCOUNTER — Ambulatory Visit: Payer: 59 | Admitting: Physical Therapy

## 2023-03-29 ENCOUNTER — Ambulatory Visit: Payer: 59

## 2023-03-29 DIAGNOSIS — M6281 Muscle weakness (generalized): Secondary | ICD-10-CM | POA: Diagnosis not present

## 2023-03-29 DIAGNOSIS — M792 Neuralgia and neuritis, unspecified: Secondary | ICD-10-CM

## 2023-03-29 DIAGNOSIS — M542 Cervicalgia: Secondary | ICD-10-CM

## 2023-03-29 DIAGNOSIS — M546 Pain in thoracic spine: Secondary | ICD-10-CM

## 2023-03-29 NOTE — Therapy (Signed)
 OUTPATIENT PHYSICAL THERAPY TREATMENT   Patient Name: Chase Stewart MRN: [id number] DOB:Sep 29, 1985, 38 y.o., male Today's Date: 03/29/2023  END OF SESSION:  PT End of Session - 03/29/23 1652     Visit Number 6    Number of Visits 17    Date for PT Re-Evaluation 05/17/23    Authorization Type [employer] 2025 - reporting from 02/22/23    Authorization Time Period 30 per yr    Authorization - Visit Number 6    Authorization - Number of Visits 30    Progress Note Due on Visit 10    PT Start Time 1650    PT Stop Time 1728    PT Time Calculation (min) 38 min    Equipment Utilized During Treatment Gait belt    Activity Tolerance Patient tolerated treatment well;No increased pain    Behavior During Therapy [facility] for tasks assessed/performed                Past Medical History:  Diagnosis Date   Anxiety    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   Chronic kidney disease    h/o kidney stones   Depression    Migraines    Past Surgical History:  Procedure Laterality Date   ROTATOR CUFF REPAIR Right    SHOULDER ARTHROSCOPY WITH OPEN ROTATOR CUFF REPAIR AND DISTAL CLAVICLE ACROMINECTOMY Left 05/26/2015   Procedure: SHOULDER ARTHROSCOPY WITH ANTERIOR AND SUPERIOR LABRAL REPAIR;  Surgeon: Juanell Fairly, MD;  Location: [facility] ORS;  Service: Orthopedics;  Laterality: Left;   TONSILLECTOMY     TYMPANOSTOMY TUBE PLACEMENT     Patient Active Problem List   Diagnosis Date Noted   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, 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) 03/23/2023   Abnormal MRI, cervical spine (01/16/2023) 03/06/2023   Abnormal MRI, thoracic spine (11/28/2022) 03/06/2023   Thoracic facet joint pain (Left) 03/06/2023   Chronic thoracic back pain (1ry area of Pain) (Left) 03/06/2023   Chronic pain syndrome 03/05/2023   Pharmacologic therapy 03/05/2023   Disorder of skeletal system 03/05/2023   Problems influencing health status 03/05/2023   Depression 09/21/2022   DDD (degenerative disc disease), 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 male who presents to outpatient physical therapy with a referral for medical diagnosis cervicalgia and cervical radiculitis. This patient's chief complaints consist of chronic, constant, and aching thoracic spine pain that has progressed up to his neck and bilateral shoulders, leading to the following functional deficits: difficulty with sleeping, twisting, lifting, driving, working, and playing with his children. Relevant past medical history and comorbidities include anxiety/depression, bradycardia, and migraines.    SUBJECTIVE STATEMENT: Pt says full pain resolution initially after costovertebral injections. Pain has graduallly returned. HEP is going fine. Pt report he returns to see doctor at beginning of April.    PAIN:  Are you having pain? No resting pain on arrival. Intermittent pain   PRECAUTIONS: None  RED FLAGS: Patient denies hx of cancer, stroke, seizures, lung problems, heart problems, diabetes, unexplained weight loss, unexplained changes in bowel or bladder problems, unexplained stumbling or dropping things, osteoporosis, and spinal surgery   PLOF: Independent  PATIENT GOALS: strengthening muscles, help with stability, relieve some  pressure on joints   NEXT MD VISIT: left thoracic spine injection 03/23/2023.   OBJECTIVE:  -remarkable degree of trigger points and spastic muscles in t spine and medial scapular groups   -significant segmental thoracic spine hypomobility T3-T9    TREATMENT 03/29/23                                                                                                                               -UBE level 8 x5 minutes, alternating direction  *pt declines thoracoscapular TPDN today due to being headed to work immediately after, suspect some slight needle aversion.   -Hook lying on towel roll T6 level, hot pack (BUE pec stretch 3x60sec, progressing from airplane to goal posts)  -transition to wand flexion with sled bar x20   -chest press for t spine mobility 1x10 45lb -seated on thin foam pad wide row 1x10 @ 45lb  -seated on thin foam pad lat pulldown 1x12 @ 45lb (bar to sternum)  -chest press for t spine mobility 1x10 45lb -seated on thin foam pad wide row 1x10 @ 45lb  -seated on thin foam pad lat pulldown 1x12 @ 45lb (bar to sternum)  -hooklying reverse crunch over foam roller 3x5 @ T8, T6, T4   -Sidelying book openings 1x20 bilat  -standing BUE ER with BTB x15   -BUE scaption to end range+ thoracic extension c silver physio ball x20    PATIENT EDUCATION:  Education details: Exercise purpose/form. Self management techniques.  Education on HEP including handout. Person educated: Patient Education method: Medical illustrator Education comprehension: verbalized understanding, returned demonstration, and needs further education  HOME EXERCISE PROGRAM: Access Code: [email or web address] URL: [email or web address] Date: 03/15/2023 Prepared by: Norton Blizzard  Exercises - Gentle Levator Scapulae Stretch  - 1 x daily - 2 sets - 30 seconds hold - Seated Posture with Lumbar Roll  - Prone Cervical Retraction  - 3 x weekly -  2 reps - 10 seconds hold - Single Arm Bent Over  Shoulder Horizontal Abduction with Dumbbell - Palm Down  - 3 x weekly - 2-3 sets - 15 reps - Single Arm Bent Over Shoulder Scaption with Dumbbell  - 3 x weekly - 2-3 sets - 15 reps-   HOME EXERCISE PROGRAM [[email or web address]] View at [email or web address] using code: [id number] Prayer Stretch -  Repeat 1 Repetition, Hold 2 Seconds, Complete 10 Sets, Perform 2 Times a Day  ASSESSMENT:  CLINICAL IMPRESSION: Continued with progressive mobility and strength program. Offered to perform dry needling, but pt defers due to going in to work immediately after this. Pt has most triggering of symptoms with extension over foam roller and with book opening, but continues to experience periods of remittance after these. Patient would benefit from continued management of limiting condition by skilled physical therapist to address remaining impairments and functional limitations to work towards stated goals and return to PLOF or maximal functional independence.    OBJECTIVE IMPAIRMENTS: decreased activity tolerance, decreased endurance, decreased knowledge of condition, decreased mobility, decreased ROM, decreased strength, hypomobility, impaired sensation, and pain.   ACTIVITY LIMITATIONS: carrying, lifting, sitting, sleeping, and caring for others  PARTICIPATION LIMITATIONS: meal prep, cleaning, laundry, interpersonal relationship, driving, community activity, and occupation  PERSONAL FACTORS: Past/current experiences, Time since onset of injury/illness/exacerbation, and 3+ comorbidities: anxiety/depression, migraines, bradycardia  are also affecting patient's functional outcome.   REHAB POTENTIAL: Fair chronicity of condition, unclear diagnosis/cause of current pain   CLINICAL DECISION MAKING: Evolving/moderate complexity  EVALUATION COMPLEXITY: Moderate  GOALS: Goals reviewed with patient? No SHORT TERM GOALS: Target date: 03/08/2023  Patient will be independent with initial home exercise program for  self-management of symptoms. Baseline: Initial HEP provided at IE (02/22/23); Goal status: In progress  Patient will demonstrate improvement in Patient Specific Functional Scale (PSFS) of equal or greater than 3 points to reflect clinically significant improvement in patient's most valued functional activities.. Baseline: to be measured at visit 2 as appropriate (02/22/23); 5/10 at visit #2 (03/08/2023) Goal status: In Progress  3. Patient will demonstrate improvement in Neck Disability Index (NDI) by equal or greater than 10 percentage points (MCID) to reflect clinically significant improvement in overall condition and self-reported functional ability. Baseline: 22% (02/22/23); Goal status: In progress  4. Patient will report improvement in NPRS of equal or greater than 2 points during functional activities to improve their abilitly to complete community, work and/or recreational activities with less limitation. Baseline: up to 8/10 with activity (02/22/23); Goal status: In progress  LONG TERM GOALS: Target date: 05/17/2023 Patient will be independent with a long-term home exercise program for self-management of symptoms.  Baseline: Initial HEP provided at IE (02/22/23); Goal status: In progress  2.  Patient will demonstrate improved Neck Disability Index (NDI) to equal or less than 10% to demonstrate improvement in overall condition and self-reported functional ability.  Baseline: 22% (02/22/23); Goal status: In progress  3.  Patient will demonstrate a 10% increase in weight lifted from floor to waist to be able to complete lifting activities with less difficulty.   Baseline: to be measured at visit 2 as appropriate (02/22/23); Goal status: In progress  4.  Patient will demonstrate improvement in lower trapezius strength to equal to or greater than 4/5 bilaterally for improved postural strength/endurance to make prolonged sitting/standing less difficult.  Baseline: 3/5 bilaterally  (02/22/23); Goal status: In progress  5.  Patient will demonstrate improvement in Patient Specific Functional Scale (PSFS) of equal or greater  than 8/10 points to reflect clinically significant improvement in patient's most valued functional activities. Baseline: to be measured at visit 2 as appropriate (02/22/23); 5/10 at visit #2 (03/08/2023) Goal status: In Progress  6.  Patient will report NPRS equal or less than 3/10 during functional activities during the last 2 weeks to improve their abilitly to complete community, work and/or recreational activities with less limitation. Baseline: up to 8/10 with activity (02/22/23); Goal status: In progress PLAN: PT FREQUENCY: 1-2x/week  PT DURATION: 8-12 weeks  PLANNED INTERVENTIONS: 97164- PT Re-evaluation, 97110-Therapeutic exercises, 97530- Therapeutic activity, 97112- Neuromuscular re-education, 97535- Self Care, 40981- Manual therapy, 97014- Electrical stimulation (unattended), [phone]- Electrical stimulation (manual), Patient/Family education, Dry Needling, Joint mobilization, Joint manipulation, Spinal manipulation, Spinal mobilization, Cryotherapy, and Moist heat.  PLAN FOR NEXT SESSION: Floor to waist lifting assessment as appropriate.  Update HEP as needed. Dry needling. Periscapular strengthening, thoracic spine mobility.    5:28 PM, 03/29/23 Rosamaria Lints, PT, DPT Physical Therapist - [place] [phone] (Office)

## 2023-04-03 ENCOUNTER — Ambulatory Visit: Payer: 59 | Admitting: Physical Therapy

## 2023-04-05 ENCOUNTER — Ambulatory Visit: Payer: 59 | Admitting: Physical Therapy

## 2023-04-10 ENCOUNTER — Ambulatory Visit: Payer: 59 | Admitting: Physical Therapy

## 2023-04-10 ENCOUNTER — Telehealth: Payer: Self-pay | Admitting: Physical Therapy

## 2023-04-10 NOTE — Telephone Encounter (Signed)
 Spoke with patient notifying patient of missed PT visit scheduled at 3:15 today.  He stated he was not aware of the appointment and confirmed his next PT appointment scheduled at 3:15pm on Wed 04/12/2023. Let patient know that with any no-show I am required to review our cancellation policy that after 2 no-shows we cannot schedule more than 1 week at a time. Patient stated he understood.   Luretha Murphy. Ilsa Iha, PT, DPT 04/10/23, 7:57 PM  [facility] [address] [place], [place] [address] P: [phone] I F: [phone]

## 2023-04-10 NOTE — Progress Notes (Unsigned)
 Marland Kitchen

## 2023-04-11 ENCOUNTER — Encounter: Payer: Self-pay | Admitting: Pain Medicine

## 2023-04-11 ENCOUNTER — Ambulatory Visit: Attending: Pain Medicine | Admitting: Pain Medicine

## 2023-04-11 VITALS — BP 106/67 | HR 73 | Temp 98.4°F | Resp 16 | Ht 69.0 in | Wt 155.0 lb

## 2023-04-11 DIAGNOSIS — R937 Abnormal findings on diagnostic imaging of other parts of musculoskeletal system: Secondary | ICD-10-CM | POA: Diagnosis not present

## 2023-04-11 DIAGNOSIS — G8929 Other chronic pain: Secondary | ICD-10-CM | POA: Insufficient documentation

## 2023-04-11 DIAGNOSIS — M5134 Other intervertebral disc degeneration, thoracic region: Secondary | ICD-10-CM | POA: Insufficient documentation

## 2023-04-11 DIAGNOSIS — Z09 Encounter for follow-up examination after completed treatment for conditions other than malignant neoplasm: Secondary | ICD-10-CM | POA: Insufficient documentation

## 2023-04-11 DIAGNOSIS — M546 Pain in thoracic spine: Secondary | ICD-10-CM | POA: Diagnosis not present

## 2023-04-11 DIAGNOSIS — R0781 Pleurodynia: Secondary | ICD-10-CM

## 2023-04-11 NOTE — Progress Notes (Signed)
 PROVIDER NOTE: Information contained herein reflects review and annotations entered in association with encounter. Interpretation of such information and data should be left to medically-trained personnel. Information provided to patient can be located elsewhere in the medical record under "Patient Instructions". Document created using STT-dictation technology, any transcriptional errors that may result from process are unintentional.    Patient: Chase Stewart  Service Category: E/M  Provider: Oswaldo Done, MD  DOB: 13-Sep-1985  DOS: 04/11/2023  Referring Provider: No ref. provider found  MRN: [id number]  Specialty: Interventional Pain Management  PCP: Patient, No Pcp Per  Type: Established Patient  Setting: Ambulatory outpatient    Location: Office  Delivery: Face-to-face     HPI  [name], a 38 y.o. year old male, is here today because of his Chronic left-sided thoracic back pain [M54.6, G89.29]. 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 Pain Assessment: Severity of Chronic pain is reported as a 5 /10. Location: Back Upper/denies. Onset: More than a month ago. Quality: Other (Comment) (stiff). Timing: Constant. Modifying factor(s): rest, ice, heat, Ibuprofen, posture vist. Vitals:  height is 5\' 9"  (1.753 m) and weight is 155 lb (70.3 kg). His temporal temperature is 98.4 F (36.9 C). His blood pressure is 106/67 and his pulse is 73. His respiration is 16 and oxygen saturation is 99%.  BMI: Estimated body mass index is 22.89 kg/m as calculated from the following:   Height as of  this encounter: 5\' 9"  (1.753 m).   Weight as of this encounter: 155 lb (70.3 kg). Last encounter: 03/06/2023. Last procedure: 03/23/2023.  Reason for encounter: post-procedure evaluation and assessment.   Discussed the use of AI scribe software for clinical note transcription with the patient, who gave verbal consent to proceed.  History of Present Illness   Chase Stewart is a 38 year old male who presents for follow-up after a diagnostic thoracic facet medial branch block. He was referred by Dr. Filomena Jungling for evaluation of thoracic spine issues.  He underwent a diagnostic thoracic facet medial branch block and costal transverse block at the T6, T7, and T8 levels on the left side, which provided 100% pain relief during the duration of the local anesthetic. This was followed by a gradual decrease to 90% improvement and then 80% improvement, lasting for two more days. By the third day, the pain returned to its original state. Immediately after the procedure, he experienced no pain for at least three hours. Around the fourth hour, he began to feel some pain, particularly when twisting, which persisted for the rest of the night. The next morning, his pain level was about a three out of ten, indicating a 50% improvement compared to before the procedure. By the third day, the pain had returned to its previous level.  He previously had a left intercostal nerve block at T5, T6, and T7, which provided about seven days of relief. He also underwent a left T6-T7 transforaminal epidural steroid injection and a bilateral T6-T7 transforaminal epidural steroid injection, both of which provided minimal relief, with the first injection offering a couple of days of relief and the second providing no relief.  An  MRI of the thoracic spine performed on November 28, 2022, showed an eccentric left-sided disc bulge at T6-T7 resulting in mild deformity of the left ventral cord and a small right central disc protrusion  at T7-T8.  He denies any trauma or injury preceding the onset of his back pain, which he describes as having gradually worsened over time. He does not take any blood thinners. No chest pain, shortness of breath, or palpitations.      Post-procedure evaluation   Procedure: Thoracic facet medial branch block/costotransverse #1  Laterality: Left (-LT)  Level: T6, T7, and T8 Medial Branch Level(s)  Imaging: Fluoroscopy-guided Spinal ([id number]) Anesthesia: Local anesthesia (1-2% Lidocaine) Anxiolysis: IV Versed 2.0 mg Sedation: Moderate Sedation Fentanyl 1 mL (50 mcg) DOS: 03/23/2023  Performed by: Oswaldo Done, MD  Purpose: Diagnostic/Therapeutic Indications: Thoracic back pain severe enough to impact quality of life or function. Rationale (medical necessity): procedure needed and proper for the diagnosis and/or treatment of Chase Stewart medical symptoms and needs. 1. Chronic thoracic back pain (1ry area of Pain) (Left)   2. 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 DDD (degenerative disc disease), thoracic   4. Rib pain   5. Thoracic facet joint pain (Left)   6. Abnormal MRI, thoracic spine (11/28/2022)   7. Abnormal MRI, cervical spine (01/16/2023)    NAS-11 Pain score:   Pre-procedure: 4 /10   Post-procedure: 0-No pain/10    Effectiveness:  Initial hour after procedure: 100 %. Subsequent 4-6 hours post-procedure: 100 %. Analgesia past initial 6 hours: 80 % (lasted 2 days). Ongoing improvement:  Analgesic: The patient indicates having attained 100% relief of the pain for the duration of the local anesthetic followed by an 80% relief that lasted for an additional 2 days.  After that the pain started to return and currently is back to baseline. Function: Transient improvement ROM: Transient improvement   Pharmacotherapy Assessment  Analgesic: No chronic opioid analgesics therapy prescribed by our practice. None MME/day: 0 mg/day   Monitoring: [place]  PMP: PDMP reviewed during this encounter.       Pharmacotherapy: No side-effects or adverse reactions reported. Compliance: No problems identified. Effectiveness: Clinically acceptable.  No notes on file  No results found for: "CBDTHCR" No results found for: "D8THCCBX" No results found for: "D9THCCBX"  UDS:  No results found for: "SUMMARY"    ROS  Constitutional: Denies any fever or chills Gastrointestinal: No reported hemesis, hematochezia, vomiting, or acute GI distress Musculoskeletal: Denies any acute onset joint swelling, redness, loss of ROM, or weakness Neurological: No reported episodes of acute onset apraxia, aphasia, dysarthria, agnosia, amnesia, paralysis, loss of coordination, or loss of consciousness  Medication Review  Alpha-Lipoic Acid, Ibuprofen, Turmeric, naproxen, and valACYclovir  History Review  Allergy: Chase Stewart has no known allergies. Drug: Chase Stewart  reports that he does not currently use drugs after having used the following drugs: Marijuana. Alcohol:  reports no history of alcohol use. Tobacco:  reports that he has never smoked. He has never used smokeless tobacco. Social: Chase Stewart  reports that he has never smoked. He has never used smokeless tobacco. He reports that he does not currently use drugs after having used the following drugs: Marijuana. He reports that he does not drink alcohol. Medical:  has a past medical history of Anxiety, Bradycardia (01/16/2020), Chronic kidney disease, Depression, and Migraines. Surgical: Chase Stewart  has a past surgical history that includes Rotator cuff repair (Right); Tonsillectomy; Tympanostomy tube placement; and Shoulder arthroscopy  with open rotator cuff repair and distal clavicle acrominectomy (Left, 05/26/2015). Family: family history includes CAD in his father; Hyperthyroidism in his father.  Laboratory Chemistry Profile   Renal Lab Results  Component Value Date   BUN 12 03/16/2022   CREATININE 1.03  03/16/2022   BCR 12 03/16/2022   GFRAA 101 05/23/2019   GFRNONAA >60 01/25/2020    Hepatic Lab Results  Component Value Date   AST 19 03/16/2022   ALT 14 03/16/2022   ALBUMIN 4.7 03/16/2022   ALKPHOS 67 03/16/2022    Electrolytes Lab Results  Component Value Date   NA 140 03/16/2022   K 3.9 03/16/2022   CL 104 03/16/2022   CALCIUM 9.4 03/16/2022   PHOS 3.5 03/16/2022    Bone Lab Results  Component Value Date   TESTOFREE 9.0 05/15/2020   TESTOSTERONE 532 05/15/2020    Inflammation (CRP: Acute Phase) (ESR: Chronic Phase) Lab Results  Component Value Date   CRP 2.2 04/01/2020   ESRSEDRATE 6 04/01/2020         Note: Above Lab results reviewed.  Recent Imaging Review  DG PAIN CLINIC C-ARM 1-60 MIN NO REPORT Fluoro was used, but no Radiologist interpretation will be provided.  Please refer to "NOTES" tab for provider progress note. Note: Reviewed        Physical Exam  General appearance: Well nourished, well developed, and well hydrated. In no apparent acute distress Mental status: Alert, oriented x 3 (person, place, & time)       Respiratory: No evidence of acute respiratory distress Eyes: PERLA Vitals: BP 106/67 (Cuff Size: Normal)   Pulse 73   Temp 98.4 F (36.9 C) (Temporal)   Resp 16   Ht 5\' 9"  (1.753 m)   Wt 155 lb (70.3 kg)   SpO2 99%   BMI 22.89 kg/m  BMI: Estimated body mass index is 22.89 kg/m as calculated from the following:   Height as of this encounter: 5\' 9"  (1.753 m).   Weight as of this encounter: 155 lb (70.3 kg). Ideal: Ideal body weight: 70.7 kg (155 lb 13.8 oz)  Assessment   Diagnosis Status  1. Chronic thoracic back pain (1ry area of Pain) (Left)   2. DDD (degenerative disc disease), thoracic   3. 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 Thoracic facet joint pain (Left)   5. Abnormal MRI, thoracic spine (11/28/2022)   6. Postop check    Improving Stable Controlled   Updated Problems: Problem  Acute Back  Pain (Resolved)    Plan of Care  Problem-specific:  Assessment and Plan    Thoracic disc bulge with foraminal stenosis   He has an eccentric left-sided disc bulge at T6-T7 causing mild deformity of the left ventral cord and a small right central disc protrusion at T7-T8. Significant pain relief followed a thoracic facet medial branch block/costal transverse block, indicating the pain likely originates from the facet or costovertebral joint. Previous intercostal nerve blocks provided temporary relief, while transforaminal epidural steroid injections were less effective. Radiofrequency ablation was discussed for longer-lasting relief, with outcomes ranging from three to eighteen months. A second diagnostic injection with reduced volume is necessary to confirm the pain source before proceeding with radiofrequency ablation. The translaminar approach was considered for the disc bulge, but previous transforaminal injections were ineffective, and neurosurgical intervention is typically reserved for more severe cases. Schedule the second diagnostic injection as soon as possible, as the steroids from the previous injection will only last  two weeks. If the diagnostic injection confirms the pain source, consider radiofrequency ablation for longer-lasting pain relief. Evaluate the effectiveness of the second diagnostic injection before proceeding with radiofrequency ablation.      Chase Stewart is not on any long-term medications.  Pharmacotherapy (Medications Ordered): No orders of the defined types were placed in this encounter.  Orders:  Orders Placed This Encounter  Procedures   THORACIC FACET BLOCK    Standing Status:   Future    Expiration Date:   07/11/2023    Scheduling Instructions:     Procedure: Diagnostic thoracic Medial Branch Block (T6-T8) MBB #2     Side: Left-sided     Sedation: With Sedation.     Timeframe: ASAP    Where will this procedure be performed?:   [facility]    Follow-up plan:   Return for (ECT): (L) T-FCT Blk (T6, T7, T8 MMB) #2.      Interventional Therapies  Risk Factors  Considerations  Medical Comorbidities:     Planned  Pending:   Diagnostic left thoracic facet block (dorsal rami) (Costotransverse joint) #2 w/ mapping    Under consideration:   Diagnostic left thoracic facet block (dorsal rami) (Costotransverse joint) #1 w/ mapping  Possible follow-up with RFA  The costotransverse joints are innervated by the lateral branch of the thoracic dorsal rami of the spinal nerves.   Completed:   Diagnostic left thoracic facet/Costotransverse Blk x1 w/ mapping (03/23/2023) (100/100/80/0)    Therapeutic  Palliative (PRN) options:   None established   Completed by other providers:   Bilateral T6-7 transforaminal epidural injection Filomena Jungling, MD - 12/15/2022) Left T6-7 transforaminal epidural injection Filomena Jungling, MD - 10/07/2022)  Left ICNB (T5, T6, T7) ([place]) (08/04/2021) by Raechel Chute. (Dr. Lorene Dy) Thoracic interlaminar ESI - TESI (Left) T8-T9 (06/17/2021) by Raechel Chute.  H/O bilateral shoulder surgery.      Recent Visits Date Type Provider Dept  03/23/23 Procedure visit Delano Metz, MD [facility]  03/06/23 Office Visit Delano Metz, MD [facility]  Showing recent visits within past 90 days and meeting all other requirements Today's Visits Date Type Provider Dept  04/11/23 Office Visit Delano Metz, MD [facility]  Showing today's visits and meeting all other requirements Future Appointments No visits were found meeting these conditions. Showing future appointments within next 90 days and meeting all other requirements  I discussed the assessment and treatment plan with the patient. The patient was provided an opportunity to ask questions and all were answered. The patient agreed with the plan and demonstrated an understanding of the  instructions.  Patient advised to call back or seek an in-person evaluation if the symptoms or condition worsens.  Duration of encounter: 30 minutes.  Total time on encounter, as per AMA guidelines included both the face-to-face and non-face-to-face time personally spent by the physician and/or other qualified health care professional(s) on the day of the encounter (includes time in activities that require the physician or other qualified health care professional and does not include time in activities normally performed by clinical staff). Physician's time may include the following activities when performed: Preparing to see the patient (e.g., pre-charting review of records, searching for previously ordered imaging, lab work, and nerve conduction tests) Review of prior analgesic pharmacotherapies. Reviewing PMP Interpreting ordered tests (e.g., lab work, imaging, nerve conduction tests) Performing post-procedure evaluations, including interpretation of diagnostic procedures Obtaining and/or reviewing separately obtained history Performing a medically appropriate examination and/or evaluation Counseling and  educating the patient/family/caregiver Ordering medications, tests, or procedures Referring and communicating with other health care professionals (when not separately reported) Documenting clinical information in the electronic or other health record Independently interpreting results (not separately reported) and communicating results to the patient/ family/caregiver Care coordination (not separately reported)  Note by: Oswaldo Done, MD Date: 04/11/2023; Time: 4:48 PM

## 2023-04-11 NOTE — Patient Instructions (Signed)

## 2023-04-12 ENCOUNTER — Ambulatory Visit: Payer: 59 | Admitting: Physical Therapy

## 2023-05-01 DIAGNOSIS — M9902 Segmental and somatic dysfunction of thoracic region: Secondary | ICD-10-CM | POA: Diagnosis not present

## 2023-05-01 DIAGNOSIS — M9901 Segmental and somatic dysfunction of cervical region: Secondary | ICD-10-CM | POA: Diagnosis not present

## 2023-05-01 DIAGNOSIS — M5414 Radiculopathy, thoracic region: Secondary | ICD-10-CM | POA: Diagnosis not present

## 2023-05-01 DIAGNOSIS — M62830 Muscle spasm of back: Secondary | ICD-10-CM | POA: Diagnosis not present

## 2023-05-02 ENCOUNTER — Ambulatory Visit: Payer: Self-pay

## 2023-05-02 ENCOUNTER — Encounter: Admitting: Physician Assistant

## 2023-05-02 DIAGNOSIS — Z0000 Encounter for general adult medical examination without abnormal findings: Secondary | ICD-10-CM

## 2023-05-02 LAB — POCT URINALYSIS DIPSTICK
Bilirubin, UA: NEGATIVE
Blood, UA: NEGATIVE
Glucose, UA: NEGATIVE
Ketones, UA: NEGATIVE
Leukocytes, UA: NEGATIVE
Nitrite, UA: NEGATIVE
Protein, UA: NEGATIVE
Spec Grav, UA: 1.025 (ref 1.010–1.025)
Urobilinogen, UA: 0.2 U/dL
pH, UA: 6 (ref 5.0–8.0)

## 2023-05-03 LAB — CMP12+LP+TP+TSH+6AC+CBC/D/PLT
ALT: 16 IU/L (ref 0–44)
AST: 22 IU/L (ref 0–40)
Albumin: 4.7 g/dL (ref 4.1–5.1)
Alkaline Phosphatase: 75 IU/L (ref 44–121)
BUN/Creatinine Ratio: 16 (ref 9–20)
BUN: 16 mg/dL (ref 6–20)
Basophils Absolute: 0 10*3/uL (ref 0.0–0.2)
Basos: 1 %
Bilirubin Total: 0.6 mg/dL (ref 0.0–1.2)
Calcium: 9.3 mg/dL (ref 8.7–10.2)
Chloride: 104 mmol/L (ref 96–106)
Chol/HDL Ratio: 3.5 ratio (ref 0.0–5.0)
Cholesterol, Total: 141 mg/dL (ref 100–199)
Creatinine, Ser: 1.02 mg/dL (ref 0.76–1.27)
EOS (ABSOLUTE): 0.2 10*3/uL (ref 0.0–0.4)
Eos: 3 %
Estimated CHD Risk: 0.6 times avg. (ref 0.0–1.0)
Free Thyroxine Index: 1.7 (ref 1.2–4.9)
GGT: 8 IU/L (ref 0–65)
Globulin, Total: 2.4 g/dL (ref 1.5–4.5)
Glucose: 90 mg/dL (ref 70–99)
HDL: 40 mg/dL (ref >39)
Hematocrit: 41.8 % (ref 37.5–51.0)
Hemoglobin: 14.7 g/dL (ref 13.0–17.7)
Immature Grans (Abs): 0 10*3/uL (ref 0.0–0.1)
Immature Granulocytes: 0 %
Iron: 78 ug/dL (ref 38–169)
LDH: 132 IU/L (ref 121–224)
LDL Chol Calc (NIH): 84 mg/dL (ref 0–99)
Lymphocytes Absolute: 2.4 10*3/uL (ref 0.7–3.1)
Lymphs: 39 %
MCH: 32.7 pg (ref 26.6–33.0)
MCHC: 35.2 g/dL (ref 31.5–35.7)
MCV: 93 fL (ref 79–97)
Monocytes Absolute: 0.4 10*3/uL (ref 0.1–0.9)
Monocytes: 7 %
Neutrophils Absolute: 3.2 10*3/uL (ref 1.4–7.0)
Neutrophils: 50 %
Phosphorus: 2.9 mg/dL (ref 2.8–4.1)
Platelets: 183 10*3/uL (ref 150–450)
Potassium: 4.1 mmol/L (ref 3.5–5.2)
RBC: 4.5 x10E6/uL (ref 4.14–5.80)
RDW: 11.9 % (ref 11.6–15.4)
Sodium: 140 mmol/L (ref 134–144)
T3 Uptake Ratio: 25 % (ref 24–39)
T4, Total: 6.7 ug/dL (ref 4.5–12.0)
TSH: 3.47 u[IU]/mL (ref 0.450–4.500)
Total Protein: 7.1 g/dL (ref 6.0–8.5)
Triglycerides: 92 mg/dL (ref 0–149)
Uric Acid: 6.6 mg/dL (ref 3.8–8.4)
VLDL Cholesterol Cal: 17 mg/dL (ref 5–40)
WBC: 6.2 10*3/uL (ref 3.4–10.8)
eGFR: 98 mL/min/{1.73_m2} (ref >59)

## 2023-05-11 ENCOUNTER — Encounter: Payer: Self-pay | Admitting: Pain Medicine

## 2023-05-11 ENCOUNTER — Ambulatory Visit: Attending: Pain Medicine | Admitting: Pain Medicine

## 2023-05-11 ENCOUNTER — Ambulatory Visit
Admission: RE | Admit: 2023-05-11 | Discharge: 2023-05-11 | Disposition: A | Discharge status: Home / Self Care | Source: Ambulatory Visit | Attending: Pain Medicine | Admitting: Pain Medicine

## 2023-05-11 VITALS — BP 125/66 | HR 59 | Temp 98.1°F | Resp 11 | Ht 70.0 in | Wt 150.0 lb

## 2023-05-11 DIAGNOSIS — M546 Pain in thoracic spine: Secondary | ICD-10-CM | POA: Insufficient documentation

## 2023-05-11 DIAGNOSIS — M5134 Other intervertebral disc degeneration, thoracic region: Secondary | ICD-10-CM | POA: Insufficient documentation

## 2023-05-11 DIAGNOSIS — R937 Abnormal findings on diagnostic imaging of other parts of musculoskeletal system: Secondary | ICD-10-CM | POA: Diagnosis present

## 2023-05-11 DIAGNOSIS — G8929 Other chronic pain: Secondary | ICD-10-CM | POA: Diagnosis not present

## 2023-05-11 DIAGNOSIS — M47894 Other spondylosis, thoracic region: Secondary | ICD-10-CM | POA: Insufficient documentation

## 2023-05-11 MED ORDER — ROPIVACAINE HCL 2 MG/ML IJ SOLN
INTRAMUSCULAR | Status: AC
  Filled 2023-05-11: qty 20

## 2023-05-11 MED ORDER — MIDAZOLAM HCL 5 MG/5ML IJ SOLN
INTRAMUSCULAR | Status: AC
  Filled 2023-05-11: qty 5

## 2023-05-11 MED ORDER — PENTAFLUOROPROP-TETRAFLUOROETH EX AERO
INHALATION_SPRAY | Freq: Once | CUTANEOUS | Status: AC
Start: 2023-05-11 — End: 2023-05-11
  Administered 2023-05-11: 30 via TOPICAL

## 2023-05-11 MED ORDER — FENTANYL CITRATE (PF) 100 MCG/2ML IJ SOLN
25.0000 ug | INTRAMUSCULAR | Status: DC | PRN
  Administered 2023-05-11: 50 ug via INTRAVENOUS

## 2023-05-11 MED ORDER — LIDOCAINE HCL 2 % IJ SOLN
INTRAMUSCULAR | Status: AC
  Filled 2023-05-11: qty 20

## 2023-05-11 MED ORDER — DEXAMETHASONE SODIUM PHOSPHATE 10 MG/ML IJ SOLN
10.0000 mg | Freq: Once | INTRAMUSCULAR | Status: AC
  Administered 2023-05-11: 10 mg

## 2023-05-11 MED ORDER — IOHEXOL 180 MG/ML  SOLN: 10.0000 mL | Freq: Once | INTRAMUSCULAR | Status: DC

## 2023-05-11 MED ORDER — FENTANYL CITRATE (PF) 100 MCG/2ML IJ SOLN
INTRAMUSCULAR | Status: AC
  Filled 2023-05-11: qty 2

## 2023-05-11 MED ORDER — MIDAZOLAM HCL 5 MG/5ML IJ SOLN
0.5000 mg | Freq: Once | INTRAMUSCULAR | Status: AC
Start: 2023-05-11 — End: 2023-05-11
  Administered 2023-05-11: 2 mg via INTRAVENOUS

## 2023-05-11 MED ORDER — LIDOCAINE HCL 2 % IJ SOLN
20.0000 mL | Freq: Once | INTRAMUSCULAR | Status: AC
  Administered 2023-05-11: 400 mg

## 2023-05-11 MED ORDER — TRIAMCINOLONE ACETONIDE 40 MG/ML IJ SUSP
INTRAMUSCULAR | Status: AC
  Filled 2023-05-11: qty 1

## 2023-05-11 MED ORDER — ROPIVACAINE HCL 2 MG/ML IJ SOLN
9.0000 mL | Freq: Once | INTRAMUSCULAR | Status: AC
  Administered 2023-05-11: 9 mL via PERINEURAL

## 2023-05-11 NOTE — Patient Instructions (Signed)

## 2023-05-11 NOTE — Progress Notes (Signed)
 PROVIDER NOTE: Interpretation of information contained herein should be left to medically-trained personnel. Specific patient instructions are provided elsewhere under "Patient Instructions" section of medical record. This document was created in part using STT-dictation technology, any transcriptional errors that may result from this process are unintentional.  Patient: Chase Stewart Type: Established DOB: July 25, 1985 MRN: [id number] PCP: Patient, No Pcp Per  Service: Procedure DOS: 05/11/2023 Setting: Ambulatory Location: Ambulatory outpatient facility Delivery: Face-to-face Provider: Candi Chafe, MD Specialty: Interventional Pain Management Specialty designation: 09 Location: Outpatient facility Ref. Prov.: Renaldo Caroli, MD       Interventional Therapy   Procedure: Thoracic facet medial branch block #2  Laterality: Left (-LT)  Level: T6, T7, and T8 Medial Branch Level(s)  Imaging: Fluoroscopy-guided Spinal ([id number]) Anesthesia: Local anesthesia (1-2% Lidocaine ) Anxiolysis: IV Versed  2.0 mg Sedation: Moderate Sedation Fentanyl  1 mL (50 mcg) DOS: 05/11/2023  Performed by: Candi Chafe, MD  Purpose: Diagnostic/Therapeutic Indications: Thoracic back pain severe enough to impact quality of life or function. Rationale (medical necessity): procedure needed and proper for the diagnosis and/or treatment of Chase Stewart medical symptoms and needs. 1. Chronic thoracic back pain (1ry area of Pain) (Left)   2. DDD (degenerative disc disease), thoracic   3. Thoracic facet joint pain (Left)   4. Thoracic facet syndrome   5. Thoracic spine pain   6. Abnormal MRI, thoracic spine (11/28/2022)    NAS-11 Pain score:   Pre-procedure: 5 /10   Post-procedure: 5 /10     Target: Thoracic posterior (dorsal) primary rami nerve Location: 2.5 cm lateral to midline (spinous process), just cephalad to upper transverse process edge.  (needle tip placement) Region: Thoracic   Approach: Paravertebral  Type of procedure: Percutaneous perineural nerve block   Pertinent Anatomy: There are two potential fascial compartments in the TPVS: the anterior extrapleural paravertebral compartment and the posterior subendothoracic paravertebral compartment. The transverse process and the superior costotransverse ligament form the posterior boundary.  Position / Prep / Materials:  Position: Prone  Prep solution: ChloraPrep (2% chlorhexidine  gluconate and 70% isopropyl alcohol) Prep Area: Entire upper back region Materials:  Tray: Block Needle(s):  Type: Spinal  Gauge (G): 22  Length: 3.5-in  Qty: 3  H&P (Pre-op Assessment):  Chase Stewart is a 38 y.o. (year old), male patient, seen today for interventional treatment. He  has a past surgical history that includes Rotator cuff repair (Right); Tonsillectomy; Tympanostomy tube placement; and Shoulder arthroscopy with open rotator cuff repair and distal clavicle acrominectomy (Left, 05/26/2015). Chase Stewart has a current medication list which includes the following prescription(s): alpha-lipoic acid, ibuprofen , naproxen, turmeric, and valacyclovir , and the following Facility-Administered Medications: fentanyl . His primarily concern today is the Back Pain  Initial Vital Signs:  Pulse/HCG Rate: 70ECG Heart Rate: (!) 58 Temp: 98.1 F (36.7 C) Resp: 16 BP: 109/79 SpO2: 100 %  BMI: Estimated body mass index is 21.52 kg/m as calculated from the following:   Height as of this encounter: 5\' 10"  (1.778 m).   Weight as of this encounter: 150 lb (68 kg).  Risk Assessment: Allergies: Reviewed. He has no known allergies.  Allergy Precautions: None required Coagulopathies: Reviewed. None identified.  Blood-thinner therapy: None at this time Active Infection(s): Reviewed. None identified. Chase Stewart is afebrile  Site Confirmation: Chase Stewart was asked to confirm the procedure and laterality before marking the site Procedure checklist:  Completed Consent: Before the procedure and under the influence of no sedative(s), amnesic(s), or anxiolytics, the patient was informed of the treatment options,  risks and possible complications. To fulfill our ethical and legal obligations, as recommended by the American Medical Association's Code of Ethics, I have informed the patient of my clinical impression; the nature and purpose of the treatment or procedure; the risks, benefits, and possible complications of the intervention; the alternatives, including doing nothing; the risk(s) and benefit(s) of the alternative treatment(s) or procedure(s); and the risk(s) and benefit(s) of doing nothing. The patient was provided information about the general risks and possible complications associated with the procedure. These may include, but are not limited to: failure to achieve desired goals, infection, bleeding, organ or nerve damage, allergic reactions, paralysis, and death. In addition, the patient was informed of those risks and complications associated to Spine-related procedures, such as failure to decrease pain; infection (i.e.: Meningitis, epidural or intraspinal abscess); bleeding (i.e.: epidural hematoma, subarachnoid hemorrhage, or any other type of intraspinal or peri-dural bleeding); organ or nerve damage (i.e.: Any type of peripheral nerve, nerve root, or spinal cord injury) with subsequent damage to sensory, motor, and/or autonomic systems, resulting in permanent pain, numbness, and/or weakness of one or several areas of the body; allergic reactions; (i.e.: anaphylactic reaction); and/or death. Furthermore, the patient was informed of those risks and complications associated with the medications. These include, but are not limited to: allergic reactions (i.e.: anaphylactic or anaphylactoid reaction(s)); adrenal axis suppression; blood sugar elevation that in diabetics may result in ketoacidosis or comma; water retention that in patients with history  of congestive heart failure may result in shortness of breath, pulmonary edema, and decompensation with resultant heart failure; weight gain; swelling or edema; medication-induced neural toxicity; particulate matter embolism and blood vessel occlusion with resultant organ, and/or nervous system infarction; and/or aseptic necrosis of one or more joints. Finally, the patient was informed that Medicine is not an exact science; therefore, there is also the possibility of unforeseen or unpredictable risks and/or possible complications that may result in a catastrophic outcome. The patient indicated having understood very clearly. We have given the patient no guarantees and we have made no promises. Enough time was given to the patient to ask questions, all of which were answered to the patient's satisfaction. Mr. Angello has indicated that he wanted to continue with the procedure. Attestation: I, the ordering provider, attest that I have discussed with the patient the benefits, risks, side-effects, alternatives, likelihood of achieving goals, and potential problems during recovery for the procedure that I have provided informed consent. Date  Time: 05/11/2023 10:07 AM  Pre-Procedure Preparation:  Monitoring: As per clinic protocol. Respiration, ETCO2, SpO2, BP, heart rate and rhythm monitor placed and checked for adequate function Safety Precautions: Patient was assessed for positional comfort and pressure points before starting the procedure. Time-out: I initiated and conducted the "Time-out" before starting the procedure, as per protocol. The patient was asked to participate by confirming the accuracy of the "Time Out" information. Verification of the correct person, site, and procedure were performed and confirmed by me, the nursing staff, and the patient. "Time-out" conducted as per Joint Commission's Universal Protocol (UP.01.01.01). Time: 1204 Start Time: 1204 hrs.  Description/Narrative of Procedure:           Start Time: 1204 hrs. Technical description of procedure:  Rationale (medical necessity): procedure needed and proper for the diagnosis and/or treatment of the patient's medical symptoms and needs. Procedural Technique Safety Precautions: Aspiration looking for blood return was conducted prior to all injections. At no point did we inject any substances, as a needle was being advanced.  No attempts were made at seeking any paresthesias. Safe injection practices and needle disposal techniques used. Medications properly checked for expiration dates. SDV (single dose vial) medications used. Target Area: For the thoracic medial branch nerve, the target is located between the top of the transverse processes at the point where the transverse process joints the vertebra and the superior-lateral aspect of the transverse process.  (More lateral towards the superior aspect of the thoracic spine.) For safety reasons and to minimize the risk of hemo- or pneumothorax, the needle tip was not advanced past the boundary of the posterior paravertebral compartment (superior costotransverse ligament). Description of the Procedure: Protocol guidelines were followed. The patient was placed in position over the fluoroscopy table. The target area was identified and the area prepped in the usual manner. Skin & deeper tissues infiltrated with local anesthetic. Appropriate amount of time allowed to pass for local anesthetics to take effect. The procedure needles were then advanced to the target area. Proper needle placement secured. Negative aspiration confirmed. Solution injected in intermittent fashion, asking for systemic symptoms every 0.5cc of injectate. The needles were then removed and the area cleansed, making sure to leave some of the prepping solution back to take advantage of its long term bactericidal properties.             Facet Joint Innervation  T1-2 C8, T1  Medial Branch  T2-3 T1, T2         "          "   T3-4 T2, T3         "          "  T4-5 T3, T4         "          "  T5-6 T4, T5         "          "  T6-7 T5, T6         "          "  T7-8 T6, T7         "          "  T8-9 T7, T8         "          "  T9-10 T8, T9         "          "  T10-11 T9, T10         "          "  T11-12 T10, T11         "          "  T12-L1 T11, T12         "          "    Vitals:   05/11/23 1205 05/11/23 1210 05/11/23 1220 05/11/23 1230  BP: 94/68 (!) 103/58 116/69 102/65  Pulse: 64 (!) 59    Resp: 16 16 16 13   Temp:      TempSrc:      SpO2: 100% 100% 100% 100%  Weight:      Height:         End Time: 1210 hrs.  Imaging Guidance (Spinal):          Type of Imaging Technique: Fluoroscopy Guidance (Spinal) Indication(s): Fluoroscopy guidance for needle placement to enhance accuracy in procedures requiring precise needle localization for targeted  delivery of medication in or near specific anatomical locations not easily accessible without such real-time imaging assistance. Exposure Time: Please see nurses notes. Contrast: None used. Fluoroscopic Guidance: I was personally present during the use of fluoroscopy. "Tunnel Vision Technique" used to obtain the best possible view of the target area. Parallax error corrected before commencing the procedure. "Direction-depth-direction" technique used to introduce the needle under continuous pulsed fluoroscopy. Once target was reached, antero-posterior, oblique, and lateral fluoroscopic projection used confirm needle placement in all planes. Images permanently stored in EMR. Interpretation: No contrast injected. I personally interpreted the imaging intraoperatively. Adequate needle placement confirmed in multiple planes. Permanent images saved into the patient's record.  Post-operative Assessment:  Post-procedure Vital Signs:  Pulse/HCG Rate: (!) 59(!) 58 Temp: 98.1 F (36.7 C) Resp: 13 BP: 102/65 SpO2: 100 %  EBL: None  Complications: No immediate  post-treatment complications observed by team, or reported by patient.  Note: The patient tolerated the entire procedure well. A repeat set of vitals were taken after the procedure and the patient was kept under observation following institutional policy, for this type of procedure. Post-procedural neurological assessment was performed, showing return to baseline, prior to discharge. The patient was provided with post-procedure discharge instructions, including a section on how to identify potential problems. Should any problems arise concerning this procedure, the patient was given instructions to immediately contact us , at any time, without hesitation. In any case, we plan to contact the patient by telephone for a follow-up status report regarding this interventional procedure.  Comments:  No additional relevant information.  Plan of Care (POC)  Orders:  Orders Placed This Encounter  Procedures   THORACIC FACET BLOCK    Scheduling Instructions:     Thoracic Medial Branch Block     Side: Left-sided     Level(s): TBD     Sedation: Patient's choice.     Timeframe: Today    Where will this procedure be performed?:   [facility]   [facility] C-ARM 1-60 MIN NO REPORT    Intraoperative interpretation by procedural physician at [facility].    Standing Status:   Standing    Number of Occurrences:   1    Reason for exam::   Assistance in needle guidance and placement for procedures requiring needle placement in or near specific anatomical locations not easily accessible without such assistance.   Informed Consent Details: Physician/Practitioner Attestation; Transcribe to consent form and obtain patient signature    Nursing Order: Transcribe to consent form and obtain patient signature. Note: Always confirm laterality of pain with Mr. Itzkowitz, before procedure.    Physician/Practitioner attestation of informed consent for procedure/surgical case:   I, the  physician/practitioner, attest that I have discussed with the patient the benefits, risks, side effects, alternatives, likelihood of achieving goals and potential problems during recovery for the procedure that I have provided informed consent.    Procedure:   Thoracic medial branch facet block    Physician/Practitioner performing the procedure:   Adellyn Capek A. 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    Standing Status:   Standing    Number of Occurrences:   1   Chronic Opioid Analgesic:   No chronic opioid analgesics therapy prescribed by our practice. None MME/day: 0 mg/day    Medications ordered for procedure: Meds ordered this encounter  Medications   DISCONTD: iohexol  (OMNIPAQUE ) 180 MG/ML injection 10 mL    Must be Myelogram-compatible. If not available, you may substitute with a water-soluble, non-ionic, hypoallergenic, myelogram-compatible radiological contrast medium.   lidocaine  (XYLOCAINE ) 2 % (with pres) injection 400 mg   pentafluoroprop-tetrafluoroeth (GEBAUERS) aerosol   midazolam  (VERSED ) 5 MG/5ML injection 0.5-2 mg    Make sure Flumazenil is available in the pyxis when using this medication. If oversedation occurs, administer 0.2 mg IV over 15 sec. If after 45 sec no response, administer 0.2 mg again over 1 min; may repeat at 1 min intervals; not to exceed 4 doses (1 mg)   fentaNYL  (SUBLIMAZE ) injection 25-50 mcg    Make sure Narcan is available in the pyxis when using this medication. In the event of respiratory depression (RR<  8/min): Titrate NARCAN (naloxone) in increments of 0.1 to 0.2 mg IV at 2-3 minute intervals, until desired degree of reversal.   ropivacaine  (PF) 2 mg/mL (0.2%) (NAROPIN ) injection 9 mL   dexamethasone  (DECADRON ) injection 10 mg   Medications administered: We administered lidocaine , pentafluoroprop-tetrafluoroeth, midazolam , fentaNYL , ropivacaine  (PF) 2 mg/mL (0.2%), and dexamethasone .  See the medical record for exact dosing, route, and time of administration.  Follow-up plan:   Return in about 2 weeks (around 05/25/2023) for (Face2F), (PPE).       Interventional Therapies  Risk Factors  Considerations  Medical Comorbidities:     Planned  Pending:   Diagnostic left thoracic facet block (dorsal rami) (Costotransverse joint) #2 w/ mapping    Under consideration:   Diagnostic left thoracic facet block (dorsal rami) (Costotransverse joint) #1 w/ mapping  Possible follow-up with RFA  The costotransverse joints are innervated by the lateral branch of the thoracic dorsal rami of the spinal nerves.   Completed:   Diagnostic left thoracic facet/Costotransverse Blk x1 w/ mapping (03/23/2023) (100/100/80/0)    Therapeutic  Palliative (PRN) options:   None established   Completed by other providers:   Bilateral T6-7 transforaminal epidural injection Candi Chafe, MD - 12/15/2022) Left T6-7 transforaminal epidural injection Candi Chafe, MD - 10/07/2022)  Left ICNB (T5, T6, T7) ([place]) (08/04/2021) by [facility]. (Dr. Janalyn Me) Thoracic interlaminar ESI - TESI (Left) T8-T9 (06/17/2021) by [facility].  H/O bilateral shoulder surgery.       Recent Visits Date Type Provider Dept  04/11/23 Office Visit Renaldo Caroli, MD [facility]  03/23/23 Procedure visit Renaldo Caroli, MD [facility]  03/06/23 Office Visit Renaldo Caroli, MD [facility]  Showing recent visits within past 90 days and meeting all other requirements Today's  Visits Date Type Provider Dept  05/11/23 Procedure visit Renaldo Caroli, MD [facility]  Showing today's visits and meeting all other requirements Future Appointments Date Type Provider Dept  05/29/23 Appointment Renaldo Caroli, MD [facility]  Showing future appointments within next 90 days and meeting all other requirements  Disposition: Discharge home  Discharge (Date  Time): 05/11/2023; 1240 hrs.   Primary Care Physician: Patient, No Pcp Per Location: [facility] Note by: Candi Chafe, MD (TTS technology used. I apologize for any typographical errors that were not detected  and corrected.) Date: 05/11/2023; Time: 12:42 PM  Disclaimer:  Medicine is not an Visual merchandiser. The only guarantee in medicine is that nothing is guaranteed. It is important to note that the decision to proceed with this intervention was based on the information collected from the patient. The Data and conclusions were drawn from the patient's questionnaire, the interview, and the physical examination. Because the information was provided in large part by the patient, it cannot be guaranteed that it has not been purposely or unconsciously manipulated. Every effort has been made to obtain as much relevant data as possible for this evaluation. It is important to note that the conclusions that lead to this procedure are derived in large part from the available data. Always take into account that the treatment will also be dependent on availability of resources and existing treatment guidelines, considered by other Pain Management Practitioners as being common knowledge and practice, at the time of the intervention. For Medico-Legal purposes, it is also important to point out that variation in procedural techniques and pharmacological choices are the acceptable norm. The indications, contraindications, technique, and results of the above procedure should only be  interpreted and judged by a Board-Certified Interventional Pain Specialist with extensive familiarity and expertise in the same exact procedure and technique.

## 2023-05-11 NOTE — Progress Notes (Signed)
 Safety precautions to be maintained throughout the outpatient stay will include: orient to surroundings, keep bed in low position, maintain call bell within reach at all times, provide assistance with transfer out of bed and ambulation.

## 2023-05-12 ENCOUNTER — Telehealth: Payer: Self-pay | Admitting: *Deleted

## 2023-05-12 NOTE — Telephone Encounter (Signed)
 No problems post procedure.

## 2023-05-18 ENCOUNTER — Encounter: Admitting: Physician Assistant

## 2023-05-23 ENCOUNTER — Encounter: Payer: Self-pay | Admitting: Physician Assistant

## 2023-05-23 ENCOUNTER — Ambulatory Visit: Payer: Self-pay | Admitting: Physician Assistant

## 2023-05-23 VITALS — HR 63 | Temp 98.6°F | Resp 14 | Ht 69.0 in | Wt 152.0 lb

## 2023-05-23 DIAGNOSIS — Z0000 Encounter for general adult medical examination without abnormal findings: Secondary | ICD-10-CM

## 2023-05-23 NOTE — Progress Notes (Signed)
 [facility]  ____________________________________________   None    (approximate)  I have reviewed the triage vital signs and the nursing notes.   HISTORY  Chief Complaint Annual Exam   HPI Chase Stewart is a 38 y.o. male patient presents for annual employment physical.  Patient is followed by pain management for chronic upper back pain.         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) 03/23/2023   Abnormal MRI, cervical spine (01/16/2023) 03/06/2023   Abnormal MRI, thoracic spine (11/28/2022) 03/06/2023   Thoracic facet joint pain (Left) 03/06/2023   Chronic thoracic back pain (1ry area of Pain) (Left) 03/06/2023   Chronic pain syndrome 03/05/2023   Pharmacologic therapy 03/05/2023   Disorder of skeletal system 03/05/2023   Problems influencing health status 03/05/2023   Depression 09/21/2022   DDD (degenerative disc disease), thoracic 06/17/2021   Anxiety 06/17/2021   Rib pain 04/02/2020   History of herpes labialis 12/28/2015   Shoulder pain 08/28/2015    Past Surgical History:  Procedure Laterality Date   ROTATOR CUFF REPAIR Right    SHOULDER ARTHROSCOPY WITH OPEN ROTATOR CUFF REPAIR AND DISTAL CLAVICLE ACROMINECTOMY Left 05/26/2015   Procedure: SHOULDER ARTHROSCOPY WITH ANTERIOR AND SUPERIOR LABRAL REPAIR;  Surgeon: Rande Bushy, MD;  Location: [facility] ORS;  Service: Orthopedics;  Laterality: Left;   TONSILLECTOMY     TYMPANOSTOMY TUBE PLACEMENT      Prior to Admission medications   Medication Sig Start Date End Date Taking? Authorizing Provider  ALPHA LIPOIC ACID PO Take by mouth daily.   Yes [provider]   TURMERIC PO Take by mouth daily.   Yes [provider]  naproxen (NAPROSYN) 500 MG tablet 1 po bid prn Patient not taking: Reported on 05/23/2023 10/26/22   [provider]  valACYclovir  (VALTREX ) 1000 MG tablet Take 1 tablet (1,000 mg total) by mouth 2 (two) times daily. Patient not taking: Reported on 05/23/2023 08/03/22   Marcina Severe, PA-C    Allergies Patient has no known allergies.  Family History  Problem Relation Age of Onset   Hyperthyroidism Father    CAD Father     Social History Social History   Tobacco Use   Smoking status: Never   Smokeless tobacco: Never  Vaping Use   Vaping status: Never Used  Substance Use Topics   Alcohol use: No   Drug use: Not Currently    Types: Marijuana    Review of Systems Constitutional: No fever/chills Eyes: No visual changes. ENT: No sore throat. Cardiovascular: Denies chest pain. Respiratory: Denies shortness of breath. Gastrointestinal: No abdominal pain.  No nausea, no vomiting.  No diarrhea.  No constipation. Genitourinary: Negative for dysuria. Musculoskeletal: Chronic back pain.   Skin: Negative for rash. Neurological: Negative for headaches, focal weakness or numbness. Psychiatric: Anxiety and depression  ____________________________________________   PHYSICAL EXAM:  VITAL SIGNS:  05/23/2023   1:22 PM    Pulse Rate 63  Temp 98.6 F (37 C)  Weight 152 lb (68.9 kg)  Height 5\' 9"  (1.753 m)  Resp 14  SpO2 98 %   Other Vitals  BMI: 22.45 kg/m2  BSA: 1.83 m2   Constitutional: Alert and oriented. Well appearing and in no acute distress. Eyes: Conjunctivae are normal. PERRL. EOMI. Head: Atraumatic. Nose: No congestion/rhinnorhea. Mouth/Throat: Mucous membranes are moist.  Oropharynx non-erythematous. Neck: No stridor. No cervical spine tenderness to palpation. Hematological/Lymphatic/Immunilogical: No cervical lymphadenopathy. Cardiovascular: Normal rate, regular rhythm. Grossly  normal heart sounds.  Good peripheral circulation. Respiratory: Normal respiratory effort.  No retractions. Lungs CTAB. Gastrointestinal: Soft and nontender. No distention. No abdominal bruits. No CVA tenderness. Genitourinary: Deferred Musculoskeletal: No lower extremity tenderness nor edema.  No joint effusions. Neurologic:  Normal speech and language. No gross focal neurologic deficits are appreciated. No gait instability. Skin:  Skin is warm, dry and intact. No rash noted. Psychiatric: Mood and affect are normal. Speech and behavior are normal.  ____________________________________________   LABS TSH 3.470 4.060 4.730 High   2.210 2.71 R   T4, Total 6.7 7.3 6.9   9.3 R   T3 Uptake Ratio 25 27 27       Free Thyroxine Index 1.7 2.0 1.9   2.3 R   WBC 6.2  8.0      RBC 4.50  4.28      Hemoglobin 14.7  14.0      Hematocrit 41.8  39.6      MCV 93  93      MCH 32.7  32.7      MCHC 35.2  35.4      RDW 11.9  12.6      Platelets 183  202      Neutrophils 50  58      Lymphs 39  34      Monocytes 7  6      Eos 3  1      Basos 1  1      Neutrophils Absolute 3.2  4.7      Lymphocytes Absolute 2.4  2.7      Monocytes Absolute 0.4  0.5      EOS (ABSOLUTE) 0.2  0.1      Basophils Absolute 0.0  0.0      Immature Granulocytes 0  0      Immature Grans (Abs) 0.0  0.0                Component Ref Range & Units (hover) 3 wk ago (05/02/23) 10 mo ago (07/13/22) 1 yr ago (03/16/22) 2 yr ago (07/29/20) 3 yr ago (05/15/20) 3 yr ago (04/01/20) 3 yr ago (04/01/20)  Glucose 90  82 77 R, CM   82 R, CM  Uric Acid 6.6  5.1 CM      Comment:            Therapeutic target for gout patients: <6.0  BUN 16  12 16  R   12 R  Creatinine, Ser 1.02  1.03 0.98 R   0.97 R  eGFR 98  98 104 R, CM     BUN/Creatinine Ratio 16  12 NOT APPLICABLE R   NOT APPLICABLE R  Sodium 140  140 140 R   138 R  Potassium 4.1  3.9 4.3 R   4.2 R  Chloride 104  104 105 R   102 R  Calcium 9.3  9.4 9.6 R   9.6 R  Phosphorus 2.9  3.5       Total Protein 7.1  7.3 7.5 R   7.9 R  Albumin 4.7  4.7      Globulin, Total 2.4  2.6      Bilirubin Total 0.6  0.7 1.2 R   0.8 R  Alkaline Phosphatase 75  67      LDH 132  137      AST 22  19 27  R   48 High  R  ALT 16  14 30  R   93 High  R  GGT 8  9      Iron 78  71      Cholesterol, Total 141  129      Triglycerides 92  64      HDL 40  46      VLDL Cholesterol Cal 17  13      LDL Chol Calc (NIH) 84  70      Chol/HDL Ratio 3.5  2.8 CM                  Component Ref Range & Units (hover) 3 wk ago 1 yr ago 4 yr ago 13 yr ago  Color, UA yellow yellow Yellow   Clarity, UA clear clear Clear   Glucose, UA Negative Negative Negative   Bilirubin, UA neg neg Negative   Ketones, UA neg neg Negative   Spec Grav, UA 1.025 1.025 >=1.030 Abnormal    Blood, UA neg neg Negative   pH, UA 6.0 6.0 6.0   Protein, UA Negative Negative Negative   Urobilinogen, UA 0.2 0.2 0.2 0.2 R  Nitrite, UA neg neg Negative   Leukocytes, UA Negative Negative Negative NEGATIVE R  Appearance  dark  CLEAR R  Odor  none    Resulting Agency                ____________________________________________  EKG  Sinus bradycardia at 49 bpm ____________________________________________    ____________________________________________   INITIAL IMPRESSION / ASSESSMENT AND PLAN / ED COURSE  As part of my medical decision making, I reviewed the following data within the electronic MEDICAL RECORD NUMBER Notes from prior ED visits and [place] Controlled Substance Database      No acute findings on physical exam, EKG, labs.      ____________________________________________   FINAL CLINICAL IMPRESSION Well exam   ED Discharge Orders     None        Note:  This document was prepared using Dragon voice recognition software and may include unintentional dictation errors.

## 2023-05-23 NOTE — Progress Notes (Signed)
 Here for annual physical with [employer].  Being seen by pain management in the diagnostic phase at this time for chronic back pain.  No other complaints voiced.

## 2023-05-25 NOTE — Progress Notes (Signed)
 PROVIDER NOTE: Interpretation of information contained herein should be left to medically-trained personnel. Specific patient instructions are provided elsewhere under "Patient Instructions" section of medical record. This document was created in part using AI and STT-dictation technology, any transcriptional errors that may result from this process are unintentional.  Patient: Chase Stewart  Service: E/M   PCP: Patient, No Pcp Per  DOB: August 21, 1985  DOS: 05/29/2023  Provider: Candi Chafe, MD  MRN: [id number]  Delivery: Face-to-face  Specialty: Interventional Pain Management  Type: Established Patient  Setting: Ambulatory outpatient facility  Specialty designation: 09  Referring Prov.: No ref. provider found  Location: Outpatient office facility       HPI  [name], a 38 y.o. year old male, is here today because of his Chronic left-sided thoracic back pain [M54.6, G89.29]. Mr. Hankerson primary complain today is Back Pain (Mid back with left side worse)  Pertinent problems: Mr. 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 Pain Assessment: Severity of Chronic pain is reported as a 7 /10. Location: Back Mid, Left/up to left shoulder. Onset: More than a month ago. Quality: Aching, Stabbing. Timing: Constant. Modifying factor(s): heat, meds. Vitals:  height is 5\' 9"  (1.753 m) and weight is 155 lb (70.3 kg). His temperature is 97.9 F (36.6 C). His blood pressure is 112/68 and his pulse is 57 (abnormal). His oxygen saturation is 100%.  BMI: Estimated body mass index is 22.89  kg/m as calculated from the following:   Height as of this encounter: 5\' 9"  (1.753 m).   Weight as of this encounter: 155 lb (70.3 kg). Last encounter: 04/11/2023. Last procedure: 05/11/2023.  Reason for encounter: post-procedure evaluation and assessment.  The patient was initially referred to us  by Candi Chafe, MD after having done a bilateral T6-7 transforaminal ESI on 12/15/2022, a left T6-7 transforaminal ESI on 10/07/2022, neither of which provided the patient with any long-term benefit.  Prior to that the patient had a left-sided T8-9 thoracic interlaminar epidural steroid injection done on 06/17/2021 by [facility] followed by a left intercostal nerve block of T5, T6, and T7 done on 08/04/2021, also by [facility].  Through all of this the patient has continue having the pain.  An MRI of the thoracic spine done on 11/28/2022 has shown the patient to have a left eccentric disc bulge at T6-7 with mild deformity of the left ventral cord, as well a small right central disc protrusion at the 7 8 with no apparent cord deformity or compression.  A recent series of 2 thoracic facet medial branch blocks have provided the patient with relief of the pain for the duration of the local anesthetic but with no long-term benefit.  On 12/10/2021 the patient had an appointment with Lucetta Russel, PA-C who indicated send her notes that the patient had last being seen on 10/22/2021 for the thoracic pain and they had simply recommended continuing with physical therapy.  The case was apparently shared with Dr. Jeris Montes, around that time.  Discussed the use of AI scribe software for clinical note transcription with the patient, who gave verbal consent to proceed.  History of Present Illness   Chase Filsinger  Stewart is a 38 year old male who presents with persistent thoracic pain.  He returns to the clinic following his second thoracic facet block on the left side at the T6, T7, and T8 medial branch levels. He experienced 100%  relief of pain during the duration of the local anesthetic, but no long-term benefit was observed post-procedure. The pain returned within 24 to 48 hours after the numbness wore off.  An MRI of the thoracic spine performed on November 28, 2022, showed a left eccentric disc bulge at T6-7 and a small right central disc protrusion at T7-8, with no neuroforaminal narrowing. Previous interventions include an epidural steroid injection, intercostal nerve blocks, and transforaminal epidural steroid injections, all providing temporary relief. The longest relief was noted with intercostal rib blocks.  The pain, initially localized to the left side, has started to affect his right side as well, causing tightness and discomfort. He describes the pain as persistent, with temporary relief achieved through stretching, although the pain returns shortly after ceasing the activity.  He has been seen by multiple specialists, including a neurosurgeon, who previously indicated that there was nothing he could do for the pain he described. Despite various interventions, the pain persists, impacting his daily life.      Post-procedure evaluation   Procedure: Thoracic facet medial branch block #2  Laterality: Left (-LT)  Level: T6, T7, and T8 Medial Branch Level(s)  Imaging: Fluoroscopy-guided Spinal ([id number]) Anesthesia: Local anesthesia (1-2% Lidocaine ) Anxiolysis: IV Versed  2.0 mg Sedation: Moderate Sedation Fentanyl  1 mL (50 mcg) DOS: 05/11/2023  Performed by: Candi Chafe, MD  Purpose: Diagnostic/Therapeutic Indications: Thoracic back pain severe enough to impact quality of life or function. Rationale (medical necessity): procedure needed and proper for the diagnosis and/or treatment of Mr. Nunziata medical symptoms and needs. 1. Chronic thoracic back pain (1ry area of Pain) (Left)   2. DDD (degenerative disc disease), thoracic   3. Thoracic facet joint pain (Left)   4. Thoracic facet syndrome   5.  Thoracic spine pain   6. Abnormal MRI, thoracic spine (11/28/2022)    NAS-11 Pain score:   Pre-procedure: 5 /10   Post-procedure: 5 /10    Effectiveness:  Initial hour after procedure: 100 %. Subsequent 4-6 hours post-procedure: 100 %. Analgesia past initial 6 hours: 0 %. Ongoing improvement:  Analgesic: The patient indicates having attained 100% relief of the pain for the duration of the local anesthetic which then did not extend to any prolonged benefit. Function: Minimal improvement ROM: Minimal improvement   Pharmacotherapy Assessment  Analgesic: No chronic opioid analgesics therapy prescribed by our practice. None MME/day: 0 mg/day   Monitoring: [place] PMP: PDMP reviewed during this encounter.       Pharmacotherapy: No side-effects or adverse reactions reported. Compliance: No problems identified. Effectiveness: Clinically acceptable.  Merilyn Staple, RN  05/29/2023 10:54 AM  Sign when Signing Visit Safety precautions to be maintained throughout the outpatient stay will include: orient to surroundings, keep bed in low position, maintain call bell within reach at all times, provide assistance with transfer out of bed and ambulation.     No results found for: "CBDTHCR" No results found for: "D8THCCBX" No results found for: "D9THCCBX"  UDS:  No results found for: "SUMMARY"    ROS  Constitutional: Denies any fever or chills Gastrointestinal: No reported hemesis, hematochezia, vomiting, or acute GI distress Musculoskeletal: Denies any acute onset joint swelling, redness, loss of ROM, or weakness Neurological: No reported episodes of acute onset apraxia, aphasia,  dysarthria, agnosia, amnesia, paralysis, loss of coordination, or loss of consciousness  Medication Review  Alpha-Lipoic Acid, Turmeric, naproxen, and valACYclovir   History Review  Allergy: Mr. Skillen has no known allergies. Drug: Mr. Odwyer  reports that he does not currently use drugs after having used the  following drugs: Marijuana. Alcohol:  reports no history of alcohol use. Tobacco:  reports that he has never smoked. He has never used smokeless tobacco. Social: Mr. Umscheid  reports that he has never smoked. He has never used smokeless tobacco. He reports that he does not currently use drugs after having used the following drugs: Marijuana. He reports that he does not drink alcohol. Medical:  has a past medical history of Anxiety, Bradycardia (01/16/2020), Chronic kidney disease, Depression, and Migraines. Surgical: Mr. Jeff  has a past surgical history that includes Rotator cuff repair (Right); Tonsillectomy; Tympanostomy tube placement; and Shoulder arthroscopy with open rotator cuff repair and distal clavicle acrominectomy (Left, 05/26/2015). Family: family history includes CAD in his father; Hyperthyroidism in his father.  Laboratory Chemistry Profile   Renal Lab Results  Component Value Date   BUN 16 05/02/2023   CREATININE 1.02 05/02/2023   BCR 16 05/02/2023   GFRAA 101 05/23/2019   GFRNONAA >60 01/25/2020    Hepatic Lab Results  Component Value Date   AST 22 05/02/2023   ALT 16 05/02/2023   ALBUMIN 4.7 05/02/2023   ALKPHOS 75 05/02/2023    Electrolytes Lab Results  Component Value Date   NA 140 05/02/2023   K 4.1 05/02/2023   CL 104 05/02/2023   CALCIUM 9.3 05/02/2023   PHOS 2.9 05/02/2023    Bone Lab Results  Component Value Date   TESTOFREE 9.0 05/15/2020   TESTOSTERONE  532 05/15/2020    Inflammation (CRP: Acute Phase) (ESR: Chronic Phase) Lab Results  Component Value Date   CRP 2.2 04/01/2020   ESRSEDRATE 6 04/01/2020         Note: Above Lab results reviewed.  Recent Imaging Review  DG PAIN CLINIC C-ARM 1-60 MIN NO REPORT Fluoro was used, but no Radiologist interpretation will be provided.  Please refer to "NOTES" tab for provider progress note. Note: Reviewed         Physical Exam  General appearance: Well nourished, well developed, and well  hydrated. In no apparent acute distress Mental status: Alert, oriented x 3 (person, place, & time)       Respiratory: No evidence of acute respiratory distress Eyes: PERLA Vitals: BP 112/68   Pulse (!) 57   Temp 97.9 F (36.6 C)   Ht 5\' 9"  (1.753 m)   Wt 155 lb (70.3 kg)   SpO2 100%   BMI 22.89 kg/m  BMI: Estimated body mass index is 22.89 kg/m as calculated from the following:   Height as of this encounter: 5\' 9"  (1.753 m).   Weight as of this encounter: 155 lb (70.3 kg). Ideal: Ideal body weight: 70.7 kg (155 lb 13.8 oz)  Assessment   Diagnosis Status  1. Chronic thoracic back pain (1ry area of Pain) (Left)   2. 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 Thoracic facet joint pain (Left)   5. Thoracic facet syndrome   6. Thoracic spine pain   7. Postop check   8. 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 He experiences complete pain relief during local anesthetic effect but no long-term benefit. MRI shows pressure on the fecal sac, likely contributing to pain. Previous interventions, including epidural steroid injections and intercostal nerve blocks, provided only temporary relief. Current symptoms suggest compression rather than swelling. Due to persistent symptoms and absence of severe facet disease, referral to neurosurgery for potential decompression is considered. Radiofrequency ablation is a less invasive alternative, but neurosurgical opinion is prioritized to assess decompression necessity. Refer to neurosurgery for evaluation of potential decompression. Consider radiofrequency ablation if  neurosurgical evaluation suggests it as a viable option.  Right central disc protrusion at T7-8   A small right central disc protrusion at T7-8 presents with no neuroforaminal narrowing. Recent onset of right-sided symptoms may relate to prolonged left-sided pain. MRI does not indicate severe facet disease. Referral to neurosurgery is planned to evaluate the need for intervention. Refer to neurosurgery for evaluation of potential decompression.       Mr. MARIAN GRANDT is not on any long-term medications.  Pharmacotherapy (Medications Ordered): No orders of the defined types were placed in this encounter.  Orders:  Orders Placed This Encounter  Procedures   Ambulatory referral to Neurosurgery    Referral Priority:   Routine    Referral Type:   Surgical    Referral Reason:   Specialty Services Required    Referred to Provider:   Carroll Clamp, MD    Requested Specialty:   Neurosurgery    Stewart of Visits Requested:   1   Nursing Instructions:    Please complete this patient's postprocedure evaluation.    Scheduling Instructions:     Please complete this patient's postprocedure evaluation.   Follow-up plan:   Return if symptoms worsen or fail to improve.    Interventional Therapies  Risk Factors  Considerations  Medical Comorbidities:     Planned  Pending:   Referral to Dr. Henderson Lock for possible left T6-7 decompression.   Under consideration:   Possible left thoracic facet MBB & (dorsal rami) (Costotransverse joint) RFA #1 (Costotransverse joint innervation: Lateral branch of the thoracic dorsal rami.)    Completed:   Diagnostic left thoracic facet/Costotransverse Blk x2 (05/11/2023) (100/100/0/0)    Therapeutic  Palliative (PRN) options:   None established   Completed by other providers:   Bilateral T6-7 transforaminal epidural injection Candi Chafe, MD - 12/15/2022) Left T6-7 transforaminal epidural injection Candi Chafe, MD - 10/07/2022)   Left ICNB (T5, T6, T7) ([place]) (08/04/2021) by [facility]. (Dr. Janalyn Me) Thoracic interlaminar ESI - TESI (Left) T8-T9 (06/17/2021) by [facility].  H/O bilateral shoulder surgery.      Recent Visits Date Type Provider Dept  05/11/23 Procedure visit Renaldo Caroli, MD [facility]  04/11/23 Office Visit Renaldo Caroli, MD [facility]  03/23/23 Procedure visit Renaldo Caroli, MD [facility]  03/06/23 Office Visit Renaldo Caroli, MD [facility]  Showing recent visits within past 90 days and meeting all other requirements Today's Visits Date Type Provider Dept  05/29/23 Office Visit Renaldo Caroli, MD [facility]  Showing today's visits and meeting all other requirements Future Appointments No visits were found meeting these conditions. Showing future appointments within next 90 days and meeting all other requirements   I discussed the assessment and treatment plan with  the patient. The patient was provided an opportunity to ask questions and all were answered. The patient agreed with the plan and demonstrated an understanding of the instructions.  Patient advised to call back or seek an in-person evaluation if the symptoms or condition worsens.  Duration of encounter: 30 minutes.  Total time on encounter, as per AMA guidelines included both the face-to-face and non-face-to-face time personally spent by the physician and/or other qualified health care professional(s) on the day of the encounter (includes time in activities that require the physician or other qualified health care professional and does not include time in activities normally performed by clinical staff). Physician's time may include the following activities when performed: Preparing to see the patient (e.g., pre-charting review of records, searching for previously ordered imaging, lab work, and nerve conduction tests) Review of prior analgesic  pharmacotherapies. Reviewing PMP Interpreting ordered tests (e.g., lab work, imaging, nerve conduction tests) Performing post-procedure evaluations, including interpretation of diagnostic procedures Obtaining and/or reviewing separately obtained history Performing a medically appropriate examination and/or evaluation Counseling and educating the patient/family/caregiver Ordering medications, tests, or procedures Referring and communicating with other health care professionals (when not separately reported) Documenting clinical information in the electronic or other health record Independently interpreting results (not separately reported) and communicating results to the patient/ family/caregiver Care coordination (not separately reported)  Note by: Candi Chafe, MD (TTS and AI technology used. I apologize for any typographical errors that were not detected and corrected.) Date: 05/29/2023; Time: 1:15 PM

## 2023-05-29 ENCOUNTER — Ambulatory Visit: Attending: Pain Medicine | Admitting: Pain Medicine

## 2023-05-29 ENCOUNTER — Encounter: Payer: Self-pay | Admitting: Pain Medicine

## 2023-05-29 VITALS — BP 112/68 | HR 57 | Temp 97.9°F | Ht 69.0 in | Wt 155.0 lb

## 2023-05-29 DIAGNOSIS — G8929 Other chronic pain: Secondary | ICD-10-CM | POA: Insufficient documentation

## 2023-05-29 DIAGNOSIS — Z09 Encounter for follow-up examination after completed treatment for conditions other than malignant neoplasm: Secondary | ICD-10-CM | POA: Insufficient documentation

## 2023-05-29 DIAGNOSIS — M5134 Other intervertebral disc degeneration, thoracic region: Secondary | ICD-10-CM | POA: Diagnosis not present

## 2023-05-29 DIAGNOSIS — M546 Pain in thoracic spine: Secondary | ICD-10-CM | POA: Insufficient documentation

## 2023-05-29 DIAGNOSIS — R937 Abnormal findings on diagnostic imaging of other parts of musculoskeletal system: Secondary | ICD-10-CM | POA: Diagnosis not present

## 2023-05-29 DIAGNOSIS — M5114 Intervertebral disc disorders with radiculopathy, thoracic region: Secondary | ICD-10-CM | POA: Insufficient documentation

## 2023-05-29 DIAGNOSIS — M47894 Other spondylosis, thoracic region: Secondary | ICD-10-CM | POA: Diagnosis not present

## 2023-05-29 NOTE — Progress Notes (Signed)
 Safety precautions to be maintained throughout the outpatient stay will include: orient to surroundings, keep bed in low position, maintain call bell within reach at all times, provide assistance with transfer out of bed and ambulation.

## 2023-06-09 NOTE — Progress Notes (Signed)
 Referring Physician:  Renaldo Caroli, MD [address] [facility] [address] [place],  [place] [address]  Primary Physician:  Patient, No Pcp Per  History of Present Illness: 06/12/2023 Chase Stewart is here today with a chief complaint of thoracic region back pain.  He has been dealing with this for approximately 4 years.  Has had multiple injections and nerve blocks.  Has worked in physical therapy.  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  Not having any bowel or bladder dysfunction.   Conservative measures:  Physical therapy: has participated in PT at [facility]  Multimodal medical therapy including regular antiinflammatories: naproxen  Injections:06/17/2021 Left T8-9 ESI 08/04/2021 left intercostal nerve block of T5, T6, and T7  12/15/2022 bilateral T6-7 transforaminal ESI  10/07/2022 left T6-7 transforaminal ESI  03/23/2023 T6, T7, and T8 Medial Branch Block 05/11/2023 T6, T7, and T8 Medial Branch Block  Past Surgery: none  The symptoms are causing a significant impact on the patient's life.   I have utilized the care everywhere function in epic to review the outside records available from external health systems.  Review of Systems:  A 10 point review of systems is negative, except for the pertinent positives and negatives detailed in the HPI.  Past Medical History: Past Medical History:  Diagnosis Date   Anxiety    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   Chronic kidney disease    h/o kidney stones   Depression    Migraines     Past Surgical History: Past Surgical History:  Procedure Laterality Date   ROTATOR CUFF REPAIR Right    SHOULDER ARTHROSCOPY WITH OPEN ROTATOR CUFF REPAIR AND DISTAL CLAVICLE ACROMINECTOMY Left 05/26/2015   Procedure: SHOULDER ARTHROSCOPY WITH ANTERIOR AND SUPERIOR LABRAL REPAIR;  Surgeon: Rande Bushy, MD;  Location: [facility] ORS;  Service: Orthopedics;  Laterality: Left;   TONSILLECTOMY     TYMPANOSTOMY TUBE PLACEMENT       Allergies: Allergies as of 06/12/2023   (No Known Allergies)    Medications:  Current Outpatient Medications:    ALPHA LIPOIC ACID PO, Take by mouth daily., Disp: , Rfl:    naproxen (NAPROSYN) 500 MG tablet, , Disp: , Rfl:    TURMERIC PO, Take by mouth daily., Disp: , Rfl:    valACYclovir  (VALTREX ) 1000 MG tablet, Take 1 tablet (1,000 mg total) by mouth 2 (two) times daily., Disp: 4 tablet, Rfl: 2  Social History: Social History   Tobacco Use   Smoking status: Never   Smokeless tobacco: Never  Vaping Use   Vaping status: Never Used  Substance Use Topics   Alcohol use: No   Drug use: Not Currently    Types: Marijuana    Family Medical History: Family History  Problem Relation Age of Onset   Hyperthyroidism Father    CAD Father     Physical Examination: Vitals:   06/12/23 1114  BP: 108/74    General: Patient is in no apparent distress. Attention to examination is appropriate.  Neck:   Supple.  Full range of motion.  Respiratory: Patient is breathing without any difficulty.   NEUROLOGICAL:     Awake, alert, oriented to person, place, and time.  Speech is clear and fluent.    Strength:  Side Iliopsoas Quads Hamstring PF DF EHL  R 5 5 5 5 5 5   L 5 5 5 5 5 5    Reflexes are 2+ and symmetric patella and achilles.   Hoffman's is absent. Clonus is absent  Gait  is normal.    Imaging: Narrative & Impression  CLINICAL DATA:  Thoracic radiculitis. Mid back pain radiating to both sides.   EXAM: MRI THORACIC SPINE WITHOUT CONTRAST   TECHNIQUE: Multiplanar, multisequence MR imaging of the thoracic spine was performed. No intravenous contrast was administered.   COMPARISON:  None Available.   FINDINGS: Alignment:  Normal.   Vertebrae: No fracture, evidence of discitis, or bone lesion.   Cord:  Normal spinal cord signal and volume.   Paraspinal and other soft tissues: Unremarkable.   Disc levels:   Left eccentric disc bulge at T6-7 results in  mild deformity of the left ventral cord. Small right central disc protrusion at T7-8. No neural foraminal narrowing in the thoracic spine.   IMPRESSION: 1. Left eccentric disc bulge at T6-7 results in mild deformity of the left ventral cord. 2. Small right central disc protrusion at T7-8. 3. No neural foraminal narrowing in the thoracic spine.     Electronically Signed   By: Audra Blend M.D.   On: 11/28/2022 11:25    I have personally reviewed the images and agree with the above interpretation, he does appear to have some T2 signal change in the posterior lateral elements at the T5-T7 levels, this does seem to correlate with his pain.  Medical Decision Making/Assessment and Plan: Chase Stewart is a pleasant 38 y.o. male with thoracic back pain.  He has been dealing with this for approximately 4 years.  No inciting injury.  No evidence of myelopathy.  On physical examination he has good strength with no clear evidence of neurologic compromise.  He does have paraspinal back pain.  When he states that he gets there pain he states it is mostly posterior sometimes feels like it goes straight through his back to his abdomen but does not appear to radiate along his rib cage.  On review of his imaging he does show some T2 change in the pedicle and facets between T5 and T7 on the left which seem to correlate with his area of pain.  In support of this he also had a previous facet block which helped significantly with his pain.  He does have a very slight segmental kyphosis at this level as well.  He appears to be symptomatic from thoracic facet pain, at this point he does not require surgical intervention as he does not clearly have compression of his thoracic nerve roots.  In order to avoid any type of instrumentation or fusion type procedure I do feel like he would benefit from evaluation and treatment for his facet pain, currently seeing Dr. Naveira with plans for facet RFA, we agree with this plan.  He  does not show inherent instability or any other factors that would push towards a surgical fixation.  Thank you for involving me in the care of this patient.    Carroll Clamp MD/MSCR Neurosurgery

## 2023-06-12 ENCOUNTER — Encounter: Payer: Self-pay | Admitting: Neurosurgery

## 2023-06-12 ENCOUNTER — Ambulatory Visit (INDEPENDENT_AMBULATORY_CARE_PROVIDER_SITE_OTHER): Admitting: Neurosurgery

## 2023-06-12 VITALS — BP 108/74 | Ht 69.0 in | Wt 159.0 lb

## 2023-06-12 DIAGNOSIS — M47894 Other spondylosis, thoracic region: Secondary | ICD-10-CM | POA: Diagnosis not present

## 2023-07-13 ENCOUNTER — Other Ambulatory Visit: Payer: Self-pay

## 2023-07-13 DIAGNOSIS — G8929 Other chronic pain: Secondary | ICD-10-CM

## 2023-07-13 NOTE — Progress Notes (Signed)
 Scheduled to see Rheumatologist (Dr. Tobie at [facility]) on 07/17/2023.  States he's had all these tests in the past but it's been about 3 years.  States physican wanted them repeated before they consider a nerve ablation.

## 2023-07-16 LAB — CMP12+LP+TP+TSH+6AC+CBC/D/PLT
ALT: 10 IU/L (ref 0–44)
AST: 18 IU/L (ref 0–40)
Albumin: 4.7 g/dL (ref 4.1–5.1)
Alkaline Phosphatase: 79 IU/L (ref 44–121)
BUN/Creatinine Ratio: 19 (ref 9–20)
BUN: 19 mg/dL (ref 6–20)
Basophils Absolute: 0 x10E3/uL (ref 0.0–0.2)
Basos: 1 %
Bilirubin Total: 0.8 mg/dL (ref 0.0–1.2)
Calcium: 9.6 mg/dL (ref 8.7–10.2)
Chloride: 104 mmol/L (ref 96–106)
Chol/HDL Ratio: 4.4 ratio (ref 0.0–5.0)
Cholesterol, Total: 159 mg/dL (ref 100–199)
Creatinine, Ser: 1.01 mg/dL (ref 0.76–1.27)
EOS (ABSOLUTE): 0.2 x10E3/uL (ref 0.0–0.4)
Eos: 2 %
Estimated CHD Risk: 0.9 times avg. (ref 0.0–1.0)
Free Thyroxine Index: 1.6 (ref 1.2–4.9)
GGT: 9 IU/L (ref 0–65)
Globulin, Total: 2.7 g/dL (ref 1.5–4.5)
Glucose: 92 mg/dL (ref 70–99)
HDL: 36 mg/dL — ABNORMAL LOW (ref >39)
Hematocrit: 45.7 % (ref 37.5–51.0)
Hemoglobin: 15.6 g/dL (ref 13.0–17.7)
Immature Grans (Abs): 0 x10E3/uL (ref 0.0–0.1)
Immature Granulocytes: 0 %
Iron: 75 ug/dL (ref 38–169)
LDH: 125 IU/L (ref 121–224)
LDL Chol Calc (NIH): 104 mg/dL — ABNORMAL HIGH (ref 0–99)
Lymphocytes Absolute: 2.8 x10E3/uL (ref 0.7–3.1)
Lymphs: 37 %
MCH: 32.9 pg (ref 26.6–33.0)
MCHC: 34.1 g/dL (ref 31.5–35.7)
MCV: 96 fL (ref 79–97)
Monocytes Absolute: 0.6 x10E3/uL (ref 0.1–0.9)
Monocytes: 8 %
Neutrophils Absolute: 4 x10E3/uL (ref 1.4–7.0)
Neutrophils: 51 %
Phosphorus: 3.4 mg/dL (ref 2.8–4.1)
Platelets: 191 x10E3/uL (ref 150–450)
Potassium: 4.3 mmol/L (ref 3.5–5.2)
RBC: 4.74 x10E6/uL (ref 4.14–5.80)
RDW: 12.6 % (ref 11.6–15.4)
Sodium: 141 mmol/L (ref 134–144)
T3 Uptake Ratio: 25 % (ref 24–39)
T4, Total: 6.4 ug/dL (ref 4.5–12.0)
TSH: 7.16 u[IU]/mL — ABNORMAL HIGH (ref 0.450–4.500)
Total Protein: 7.4 g/dL (ref 6.0–8.5)
Triglycerides: 105 mg/dL (ref 0–149)
Uric Acid: 5.7 mg/dL (ref 3.8–8.4)
VLDL Cholesterol Cal: 19 mg/dL (ref 5–40)
WBC: 7.6 x10E3/uL (ref 3.4–10.8)
eGFR: 99 mL/min/1.73 (ref >59)

## 2023-07-16 LAB — SEDIMENTATION RATE: Sed Rate: 2 mm/h (ref 0–15)

## 2023-07-16 LAB — C-REACTIVE PROTEIN: CRP: 1 mg/L (ref 0–10)

## 2023-07-16 LAB — RHEUMATOID FACTOR: Rheumatoid fact SerPl-aCnc: <10 [IU]/mL (ref <14.0)

## 2023-07-16 LAB — ANA: Anti Nuclear Antibody (ANA): NEGATIVE

## 2023-07-16 LAB — VITAMIN D 25 HYDROXY (VIT D DEFICIENCY, FRACTURES): Vit D, 25-Hydroxy: 31.8 ng/mL (ref 30.0–100.0)

## 2023-07-17 DIAGNOSIS — M2550 Pain in unspecified joint: Secondary | ICD-10-CM | POA: Diagnosis not present

## 2023-07-17 DIAGNOSIS — M47894 Other spondylosis, thoracic region: Secondary | ICD-10-CM | POA: Diagnosis not present

## 2023-07-17 DIAGNOSIS — G8929 Other chronic pain: Secondary | ICD-10-CM | POA: Diagnosis not present

## 2023-07-17 DIAGNOSIS — M25521 Pain in right elbow: Secondary | ICD-10-CM | POA: Diagnosis not present

## 2023-07-18 ENCOUNTER — Other Ambulatory Visit: Payer: Self-pay

## 2023-07-18 DIAGNOSIS — R7989 Other specified abnormal findings of blood chemistry: Secondary | ICD-10-CM

## 2023-07-20 LAB — TSH: TSH: 4.02 u[IU]/mL (ref 0.450–4.500)

## 2023-08-17 ENCOUNTER — Encounter: Payer: Self-pay | Admitting: Physician Assistant

## 2023-08-17 ENCOUNTER — Ambulatory Visit: Payer: Self-pay | Admitting: Physician Assistant

## 2023-08-17 DIAGNOSIS — J020 Streptococcal pharyngitis: Secondary | ICD-10-CM

## 2023-08-17 MED ORDER — AMOXICILLIN-POT CLAVULANATE 875-125 MG PO TABS
1.0000 | ORAL_TABLET | Freq: Two times a day (BID) | ORAL | 0 refills | Status: AC
Start: 2023-08-17 — End: ?

## 2023-08-17 NOTE — Progress Notes (Signed)
   Subjective: Sore throat    Patient ID: Chase Stewart, male    DOB: 05-Feb-1985, 38 y.o.   MRN: [id number]  HPI Patient complaining of sore throat for 3 to 4 weeks.  Patient denies dysphagia.  Denies URI signs or symptoms.  Status post tonsillectomy many years ago.   Review of Systems Anxiety/depression and chronic upper back pain.    Objective:   Physical Exam  HEENT is unremarkable.   Neck is supple with anterior lymphadenopathy. Lungs are clear to auscultation. Heart regular rate and rhythm. Positive rapid strep test.      Assessment & Plan: Strep pharyngitis   Patient given a prescription for Augmentin  and advised to finish medication.  Due to the long-term health complaint we will repeat test after completion of antibiotics.

## 2023-08-17 NOTE — Progress Notes (Signed)
 Sore throat x3-4 wks: Doesn't feel swollen Back of throat has white bumps Doesn't have tonsils Able to swallow w/o diff Denies sinsus congestion OTC Meds - Nyquil (makes him feel groggy)

## 2023-09-02 ENCOUNTER — Encounter (HOSPITAL_COMMUNITY): Payer: Self-pay | Admitting: Emergency Medicine

## 2023-09-02 ENCOUNTER — Emergency Department (HOSPITAL_COMMUNITY)
Admission: EM | Admit: 2023-09-02 | Discharge: 2023-09-03 | Disposition: A | Discharge status: Home / Self Care | Attending: Emergency Medicine | Admitting: Emergency Medicine

## 2023-09-02 DIAGNOSIS — H02402 Unspecified ptosis of left eyelid: Secondary | ICD-10-CM | POA: Insufficient documentation

## 2023-09-02 DIAGNOSIS — R29810 Facial weakness: Secondary | ICD-10-CM | POA: Diagnosis not present

## 2023-09-02 DIAGNOSIS — R001 Bradycardia, unspecified: Secondary | ICD-10-CM | POA: Diagnosis not present

## 2023-09-02 DIAGNOSIS — R1310 Dysphagia, unspecified: Secondary | ICD-10-CM | POA: Diagnosis not present

## 2023-09-02 DIAGNOSIS — R42 Dizziness and giddiness: Secondary | ICD-10-CM | POA: Diagnosis not present

## 2023-09-02 LAB — CBC
HCT: 43.2 % (ref 39.0–52.0)
Hemoglobin: 15.1 g/dL (ref 13.0–17.0)
MCH: 32.3 pg (ref 26.0–34.0)
MCHC: 35 g/dL (ref 30.0–36.0)
MCV: 92.5 fL (ref 80.0–100.0)
Platelets: 201 K/uL (ref 150–400)
RBC: 4.67 MIL/uL (ref 4.22–5.81)
RDW: 11.9 % (ref 11.5–15.5)
WBC: 8.6 K/uL (ref 4.0–10.5)
nRBC: 0 % (ref 0.0–0.2)

## 2023-09-02 LAB — COMPREHENSIVE METABOLIC PANEL WITH GFR
ALT: 23 U/L (ref 0–44)
AST: 26 U/L (ref 15–41)
Albumin: 4.3 g/dL (ref 3.5–5.0)
Alkaline Phosphatase: 57 U/L (ref 38–126)
Anion gap: 8 (ref 5–15)
BUN: 13 mg/dL (ref 6–20)
CO2: 26 mmol/L (ref 22–32)
Calcium: 9.4 mg/dL (ref 8.9–10.3)
Chloride: 103 mmol/L (ref 98–111)
Creatinine, Ser: 1.07 mg/dL (ref 0.61–1.24)
GFR, Estimated: >60 mL/min
Glucose, Bld: 93 mg/dL (ref 70–99)
Potassium: 3.6 mmol/L (ref 3.5–5.1)
Sodium: 137 mmol/L (ref 135–145)
Total Bilirubin: 1.6 mg/dL — ABNORMAL HIGH (ref 0.0–1.2)
Total Protein: 7.7 g/dL (ref 6.5–8.1)

## 2023-09-02 NOTE — ED Triage Notes (Signed)
 Pt here from home with c//o dizziness and some eye twitching that has been ongoing for the last few days

## 2023-09-03 ENCOUNTER — Emergency Department (HOSPITAL_COMMUNITY)

## 2023-09-03 DIAGNOSIS — R29810 Facial weakness: Secondary | ICD-10-CM | POA: Diagnosis not present

## 2023-09-03 DIAGNOSIS — R1310 Dysphagia, unspecified: Secondary | ICD-10-CM | POA: Diagnosis not present

## 2023-09-03 MED ORDER — GADOBUTROL 1 MMOL/ML IV SOLN
7.0000 mL | Freq: Once | INTRAVENOUS | Status: AC | PRN
  Administered 2023-09-03: 7 mL via INTRAVENOUS

## 2023-09-03 NOTE — ED Notes (Signed)
 Patient currently in MRI.

## 2023-09-03 NOTE — Discharge Instructions (Signed)
 Follow up with your neurologist for further workup. Return to the ER for worsening or concerning symptoms.

## 2023-09-03 NOTE — ED Provider Notes (Signed)
 [place] EMERGENCY DEPARTMENT AT [facility] Provider Note   CSN: [id number] Arrival date & time: 09/02/23  2030     Patient presents with: Dizziness   Chase Stewart is a 38 y.o. male.   38 year old male presents emergency room with complaint of difficulty swallowing for the past month.  He notes that this happens with solid foods only, feels like he has to drink liquid to help push the food bolus down, occasionally regurgitates food.  For the past week and a half has had twitching around his left eye area and then on Saturday (yesterday) noticed that his left upper eyelid was drooping somewhat.  He is not having any changes in vision, speech, gait, no unilateral weakness or numbness.  No other complaints or concerns.       Prior to Admission medications   Medication Sig Start Date End Date Taking? Authorizing Provider  ALPHA LIPOIC ACID PO Take by mouth daily.    [provider]  amoxicillin -clavulanate (AUGMENTIN ) 875-125 MG tablet Take 1 tablet by mouth 2 (two) times daily. 08/17/23   Claudene Tanda POUR, PA-C  naproxen (NAPROSYN) 500 MG tablet  10/26/22   [provider]  TURMERIC PO Take by mouth daily.    [provider]  valACYclovir  (VALTREX ) 1000 MG tablet Take 1 tablet (1,000 mg total) by mouth 2 (two) times daily. 08/03/22   Claudene Tanda POUR, PA-C    Allergies: Patient has no known allergies.    Review of Systems Negative except as per HPI Updated Vital Signs BP 104/63 (BP Location: Right Arm)   Pulse (!) 58   Temp 97.8 F (36.6 C) (Oral)   Resp 16   SpO2 98%   Physical Exam Vitals and nursing note reviewed.  Constitutional:      General: He is not in acute distress.    Appearance: He is well-developed. He is not diaphoretic.  HENT:     Head: Normocephalic and atraumatic.     Mouth/Throat:     Mouth: Mucous membranes are moist.  Eyes:     Extraocular Movements: Extraocular movements intact.     Pupils: Pupils are equal,  round, and reactive to light.  Cardiovascular:     Pulses: Normal pulses.  Pulmonary:     Effort: Pulmonary effort is normal.  Musculoskeletal:     Cervical back: Neck supple.     Right lower leg: No edema.     Left lower leg: No edema.  Skin:    General: Skin is warm and dry.     Findings: No erythema or rash.  Neurological:     Mental Status: He is alert and oriented to person, place, and time.     Cranial Nerves: Facial asymmetry present.     Sensory: Sensation is intact. No sensory deficit.     Motor: No weakness.     Coordination: Coordination normal.     Comments: Slight droop of the left upper eyelid   Psychiatric:        Behavior: Behavior normal.     (all labs ordered are listed, but only abnormal results are displayed) Labs Reviewed  COMPREHENSIVE METABOLIC PANEL WITH GFR - Abnormal; Notable for the following components:      Result Value   Total Bilirubin 1.6 (*)    All other components within normal limits  CBC    EKG: None  Radiology: MR Brain W and Wo Contrast Result Date: 09/03/2023 CLINICAL DATA:  38 year old male with dizziness, left eye  droop, difficulty swallowing. EXAM: MRI HEAD WITHOUT AND WITH CONTRAST TECHNIQUE: Multiplanar, multiecho pulse sequences of the brain and surrounding structures were obtained without and with intravenous contrast. CONTRAST:  7mL GADAVIST  GADOBUTROL  1 MMOL/ML IV SOLN COMPARISON:  Brain MRI 02/16/2023, 04/15/2003 FINDINGS: Brain: Cerebral volume remains normal. No restricted diffusion to suggest acute infarction. No midline shift, mass effect, evidence of mass lesion, ventriculomegaly, extra-axial collection or acute intracranial hemorrhage. Cervicomedullary junction and pituitary are within normal limits. Incidental CSF pulsation artifact on FLAIR (series 11, image 29). Elnor and white matter signal appears stable over this series of exams and within normal limits for age. Minimal chronic nonspecific right parietal lobe white  matter T2/FLAIR hyperintensity, significance of which is doubtful. No cortical encephalomalacia. No abnormal enhancement identified. No dural thickening. Vascular: Major intracranial vascular flow voids are preserved, stable. Following contrast major dural venous sinuses are enhancing and appear patent. Skull and upper cervical spine: Normal. Sinuses/Orbits: Normal suprasellar cistern. Cavernous sinus appears symmetric and normal. Orbits soft tissues appears symmetric and normal. Paranasal sinuses and mastoids are clear. Other: Visible internal auditory structures appear normal. Negative visible scalp and face. IMPRESSION: No acute intracranial abnormality. Stable and normal for age MRI appearance of the Brain. Electronically Signed   By: VEAR Hurst M.D.   On: 09/03/2023 05:12     Procedures   Medications Ordered in the ED  gadobutrol  (GADAVIST ) 1 MMOL/ML injection 7 mL (7 mLs Intravenous Contrast Given 09/03/23 0420)                                    Medical Decision Making Amount and/or Complexity of Data Reviewed Labs: ordered. Radiology: ordered.  Risk Prescription drug management.   This patient presents to the ED for concern of difficulty swallowing, left eyelid droop, left eye twitching, dizziness, this involves an extensive number of treatment options, and is a complaint that carries with it a high risk of complications and morbidity.  The differential diagnosis includes but not limited to MS, myasthenia gravis, CVA, electrolyte abnormality, arrhythmia    Co morbidities / Chronic conditions that complicate the patient evaluation  Degenerative disc disease of thoracic spine   Additional history obtained:  Additional history obtained from EMR External records from outside source obtained and reviewed including prior labs and imaging on file   Lab Tests:  I Ordered, and personally interpreted labs.  The pertinent results include: CBC and CMP without significant  findings.   Imaging Studies ordered:  I ordered imaging studies including MRI brain with and without contrast  I independently visualized and interpreted imaging which showed no acute abnormality  I agree with the radiologist interpretation   Cardiac Monitoring: / EKG:  The patient was maintained on a cardiac monitor.  I personally viewed and interpreted the cardiac monitored which showed an underlying rhythm of: Sinus rhythm, rate 53   Problem List / ED Course / Critical interventions / Medication management  38 year old male presents with complaint of difficulty swallowing, left facial twitching and now left upper eyelid droop as well as feeling dizzy at times. Patient is found to have slight left upper eye lid ptosis. No other facial asymmetry appreciated, sensation is intact.  EKG sinus bradycardia, labs unremarkable.  MRI brain with and without contrast does not show acute findings.  Patient likely needs further workup with neurology, possibly GI.  Recommend follow-up with his neurologist. I have reviewed the patients home medicines  and have made adjustments as needed   Consultations Obtained:  I requested consultation with the ER attending, Dr. Griselda,  and discussed lab and imaging findings as well as pertinent plan - they recommend: agrees with plan of care   Social Determinants of Health:  Has specialty care team including neurology    Test / Admission - Considered:  Stable for dc to follow up with his neurologist       Final diagnoses:  Ptosis of left eyelid    ED Discharge Orders     None          Beverley Leita LABOR, PA-C 09/03/23 0548    Griselda Norris, MD 09/03/23 [phone]

## 2023-09-20 DIAGNOSIS — M9902 Segmental and somatic dysfunction of thoracic region: Secondary | ICD-10-CM | POA: Diagnosis not present

## 2023-09-20 DIAGNOSIS — M5414 Radiculopathy, thoracic region: Secondary | ICD-10-CM | POA: Diagnosis not present

## 2023-09-20 DIAGNOSIS — M62830 Muscle spasm of back: Secondary | ICD-10-CM | POA: Diagnosis not present

## 2023-09-20 DIAGNOSIS — M9901 Segmental and somatic dysfunction of cervical region: Secondary | ICD-10-CM | POA: Diagnosis not present

## 2023-09-28 DIAGNOSIS — H02402 Unspecified ptosis of left eyelid: Secondary | ICD-10-CM | POA: Diagnosis not present

## 2023-09-28 DIAGNOSIS — R42 Dizziness and giddiness: Secondary | ICD-10-CM | POA: Diagnosis not present

## 2023-09-28 DIAGNOSIS — G479 Sleep disorder, unspecified: Secondary | ICD-10-CM | POA: Diagnosis not present

## 2023-09-28 DIAGNOSIS — R5383 Other fatigue: Secondary | ICD-10-CM | POA: Diagnosis not present

## 2023-09-28 DIAGNOSIS — R202 Paresthesia of skin: Secondary | ICD-10-CM | POA: Diagnosis not present

## 2023-09-28 DIAGNOSIS — E538 Deficiency of other specified B group vitamins: Secondary | ICD-10-CM | POA: Diagnosis not present

## 2023-09-28 DIAGNOSIS — R200 Anesthesia of skin: Secondary | ICD-10-CM | POA: Diagnosis not present

## 2023-09-28 DIAGNOSIS — F339 Major depressive disorder, recurrent, unspecified: Secondary | ICD-10-CM | POA: Diagnosis not present

## 2023-09-28 DIAGNOSIS — M542 Cervicalgia: Secondary | ICD-10-CM | POA: Diagnosis not present

## 2023-09-28 DIAGNOSIS — R1310 Dysphagia, unspecified: Secondary | ICD-10-CM | POA: Diagnosis not present

## 2023-09-29 ENCOUNTER — Other Ambulatory Visit: Payer: Self-pay | Admitting: Physician Assistant

## 2023-09-29 DIAGNOSIS — R42 Dizziness and giddiness: Secondary | ICD-10-CM

## 2023-09-29 DIAGNOSIS — H02402 Unspecified ptosis of left eyelid: Secondary | ICD-10-CM

## 2023-09-29 DIAGNOSIS — R1310 Dysphagia, unspecified: Secondary | ICD-10-CM

## 2023-10-02 ENCOUNTER — Encounter: Payer: Self-pay | Admitting: Physician Assistant

## 2023-10-03 ENCOUNTER — Ambulatory Visit
Admission: RE | Admit: 2023-10-03 | Discharge: 2023-10-03 | Disposition: A | Discharge status: Home / Self Care | Source: Ambulatory Visit | Attending: Physician Assistant | Admitting: Physician Assistant

## 2023-10-03 DIAGNOSIS — R42 Dizziness and giddiness: Secondary | ICD-10-CM | POA: Diagnosis not present

## 2023-10-03 DIAGNOSIS — H02402 Unspecified ptosis of left eyelid: Secondary | ICD-10-CM

## 2023-10-03 DIAGNOSIS — R1310 Dysphagia, unspecified: Secondary | ICD-10-CM

## 2023-10-05 DIAGNOSIS — M5033 Other cervical disc degeneration, cervicothoracic region: Secondary | ICD-10-CM | POA: Diagnosis not present

## 2023-10-05 DIAGNOSIS — M9901 Segmental and somatic dysfunction of cervical region: Secondary | ICD-10-CM | POA: Diagnosis not present

## 2023-10-09 DIAGNOSIS — M5033 Other cervical disc degeneration, cervicothoracic region: Secondary | ICD-10-CM | POA: Diagnosis not present

## 2023-10-09 DIAGNOSIS — M9901 Segmental and somatic dysfunction of cervical region: Secondary | ICD-10-CM | POA: Diagnosis not present

## 2023-10-11 DIAGNOSIS — M5033 Other cervical disc degeneration, cervicothoracic region: Secondary | ICD-10-CM | POA: Diagnosis not present

## 2023-10-11 DIAGNOSIS — M9901 Segmental and somatic dysfunction of cervical region: Secondary | ICD-10-CM | POA: Diagnosis not present

## 2023-10-12 DIAGNOSIS — M5033 Other cervical disc degeneration, cervicothoracic region: Secondary | ICD-10-CM | POA: Diagnosis not present

## 2023-10-12 DIAGNOSIS — M9901 Segmental and somatic dysfunction of cervical region: Secondary | ICD-10-CM | POA: Diagnosis not present

## 2023-10-16 DIAGNOSIS — M5033 Other cervical disc degeneration, cervicothoracic region: Secondary | ICD-10-CM | POA: Diagnosis not present

## 2023-10-16 DIAGNOSIS — M9901 Segmental and somatic dysfunction of cervical region: Secondary | ICD-10-CM | POA: Diagnosis not present

## 2023-10-18 DIAGNOSIS — M9901 Segmental and somatic dysfunction of cervical region: Secondary | ICD-10-CM | POA: Diagnosis not present

## 2023-10-18 DIAGNOSIS — M5033 Other cervical disc degeneration, cervicothoracic region: Secondary | ICD-10-CM | POA: Diagnosis not present

## 2023-10-19 DIAGNOSIS — M5033 Other cervical disc degeneration, cervicothoracic region: Secondary | ICD-10-CM | POA: Diagnosis not present

## 2023-10-23 DIAGNOSIS — M9901 Segmental and somatic dysfunction of cervical region: Secondary | ICD-10-CM | POA: Diagnosis not present

## 2023-10-23 DIAGNOSIS — M5033 Other cervical disc degeneration, cervicothoracic region: Secondary | ICD-10-CM | POA: Diagnosis not present

## 2023-10-25 DIAGNOSIS — M5033 Other cervical disc degeneration, cervicothoracic region: Secondary | ICD-10-CM | POA: Diagnosis not present

## 2023-10-25 DIAGNOSIS — M9901 Segmental and somatic dysfunction of cervical region: Secondary | ICD-10-CM | POA: Diagnosis not present

## 2023-10-26 DIAGNOSIS — M5033 Other cervical disc degeneration, cervicothoracic region: Secondary | ICD-10-CM | POA: Diagnosis not present

## 2023-10-26 DIAGNOSIS — M9901 Segmental and somatic dysfunction of cervical region: Secondary | ICD-10-CM | POA: Diagnosis not present

## 2023-10-30 DIAGNOSIS — M9901 Segmental and somatic dysfunction of cervical region: Secondary | ICD-10-CM | POA: Diagnosis not present

## 2023-10-30 DIAGNOSIS — M5033 Other cervical disc degeneration, cervicothoracic region: Secondary | ICD-10-CM | POA: Diagnosis not present

## 2023-11-01 DIAGNOSIS — M9901 Segmental and somatic dysfunction of cervical region: Secondary | ICD-10-CM | POA: Diagnosis not present

## 2023-11-01 DIAGNOSIS — M5033 Other cervical disc degeneration, cervicothoracic region: Secondary | ICD-10-CM | POA: Diagnosis not present

## 2023-11-02 DIAGNOSIS — M549 Dorsalgia, unspecified: Secondary | ICD-10-CM | POA: Diagnosis not present

## 2023-11-02 DIAGNOSIS — M5033 Other cervical disc degeneration, cervicothoracic region: Secondary | ICD-10-CM | POA: Diagnosis not present

## 2023-11-02 DIAGNOSIS — R202 Paresthesia of skin: Secondary | ICD-10-CM | POA: Diagnosis not present

## 2023-11-02 DIAGNOSIS — M9901 Segmental and somatic dysfunction of cervical region: Secondary | ICD-10-CM | POA: Diagnosis not present

## 2023-11-02 DIAGNOSIS — R200 Anesthesia of skin: Secondary | ICD-10-CM | POA: Diagnosis not present

## 2023-11-02 DIAGNOSIS — M542 Cervicalgia: Secondary | ICD-10-CM | POA: Diagnosis not present

## 2023-11-02 DIAGNOSIS — R42 Dizziness and giddiness: Secondary | ICD-10-CM | POA: Diagnosis not present

## 2023-11-02 DIAGNOSIS — F419 Anxiety disorder, unspecified: Secondary | ICD-10-CM | POA: Diagnosis not present

## 2023-11-06 DIAGNOSIS — M5033 Other cervical disc degeneration, cervicothoracic region: Secondary | ICD-10-CM | POA: Diagnosis not present

## 2023-11-06 DIAGNOSIS — M9901 Segmental and somatic dysfunction of cervical region: Secondary | ICD-10-CM | POA: Diagnosis not present

## 2023-11-09 DIAGNOSIS — M5033 Other cervical disc degeneration, cervicothoracic region: Secondary | ICD-10-CM | POA: Diagnosis not present

## 2023-11-09 DIAGNOSIS — M9901 Segmental and somatic dysfunction of cervical region: Secondary | ICD-10-CM | POA: Diagnosis not present

## 2023-11-15 DIAGNOSIS — M9901 Segmental and somatic dysfunction of cervical region: Secondary | ICD-10-CM | POA: Diagnosis not present

## 2023-11-15 DIAGNOSIS — M5033 Other cervical disc degeneration, cervicothoracic region: Secondary | ICD-10-CM | POA: Diagnosis not present

## 2023-11-20 DIAGNOSIS — M5033 Other cervical disc degeneration, cervicothoracic region: Secondary | ICD-10-CM | POA: Diagnosis not present

## 2023-11-20 DIAGNOSIS — M9901 Segmental and somatic dysfunction of cervical region: Secondary | ICD-10-CM | POA: Diagnosis not present

## 2023-11-22 DIAGNOSIS — M5033 Other cervical disc degeneration, cervicothoracic region: Secondary | ICD-10-CM | POA: Diagnosis not present

## 2023-11-22 DIAGNOSIS — M9901 Segmental and somatic dysfunction of cervical region: Secondary | ICD-10-CM | POA: Diagnosis not present

## 2023-11-28 DIAGNOSIS — M5033 Other cervical disc degeneration, cervicothoracic region: Secondary | ICD-10-CM | POA: Diagnosis not present

## 2023-11-28 DIAGNOSIS — M9901 Segmental and somatic dysfunction of cervical region: Secondary | ICD-10-CM | POA: Diagnosis not present

## 2023-12-12 DIAGNOSIS — M9901 Segmental and somatic dysfunction of cervical region: Secondary | ICD-10-CM | POA: Diagnosis not present

## 2023-12-12 DIAGNOSIS — M5033 Other cervical disc degeneration, cervicothoracic region: Secondary | ICD-10-CM | POA: Diagnosis not present

## 2023-12-18 DIAGNOSIS — M9901 Segmental and somatic dysfunction of cervical region: Secondary | ICD-10-CM | POA: Diagnosis not present

## 2023-12-18 DIAGNOSIS — M5033 Other cervical disc degeneration, cervicothoracic region: Secondary | ICD-10-CM | POA: Diagnosis not present

## 2023-12-20 DIAGNOSIS — H04123 Dry eye syndrome of bilateral lacrimal glands: Secondary | ICD-10-CM | POA: Diagnosis not present

## 2023-12-20 DIAGNOSIS — H01024 Squamous blepharitis left upper eyelid: Secondary | ICD-10-CM | POA: Diagnosis not present

## 2023-12-21 DIAGNOSIS — M5033 Other cervical disc degeneration, cervicothoracic region: Secondary | ICD-10-CM | POA: Diagnosis not present

## 2023-12-21 DIAGNOSIS — M9901 Segmental and somatic dysfunction of cervical region: Secondary | ICD-10-CM | POA: Diagnosis not present

## 2023-12-25 DIAGNOSIS — M5033 Other cervical disc degeneration, cervicothoracic region: Secondary | ICD-10-CM | POA: Diagnosis not present

## 2023-12-25 DIAGNOSIS — M9901 Segmental and somatic dysfunction of cervical region: Secondary | ICD-10-CM | POA: Diagnosis not present
